# Patient Record
Sex: Female | Born: 1985 | Hispanic: No | Marital: Married | Smoking: Former smoker
Health system: Southern US, Community
[De-identification: ages and names within clinical notes are randomized; demographics above are authoritative.]

## PROBLEM LIST (undated history)

## (undated) ENCOUNTER — Emergency Department (HOSPITAL_COMMUNITY): Admission: EM | Payer: BC Managed Care – PPO | Source: Home / Self Care

## (undated) DIAGNOSIS — R011 Cardiac murmur, unspecified: Secondary | ICD-10-CM

## (undated) DIAGNOSIS — F419 Anxiety disorder, unspecified: Secondary | ICD-10-CM

## (undated) DIAGNOSIS — G4700 Insomnia, unspecified: Secondary | ICD-10-CM

## (undated) DIAGNOSIS — D649 Anemia, unspecified: Secondary | ICD-10-CM

## (undated) DIAGNOSIS — F329 Major depressive disorder, single episode, unspecified: Secondary | ICD-10-CM

## (undated) DIAGNOSIS — T7840XA Allergy, unspecified, initial encounter: Secondary | ICD-10-CM

## (undated) DIAGNOSIS — K219 Gastro-esophageal reflux disease without esophagitis: Secondary | ICD-10-CM

## (undated) DIAGNOSIS — F988 Other specified behavioral and emotional disorders with onset usually occurring in childhood and adolescence: Secondary | ICD-10-CM

## (undated) DIAGNOSIS — F32A Depression, unspecified: Secondary | ICD-10-CM

## (undated) DIAGNOSIS — Z8709 Personal history of other diseases of the respiratory system: Secondary | ICD-10-CM

## (undated) DIAGNOSIS — F410 Panic disorder [episodic paroxysmal anxiety] without agoraphobia: Secondary | ICD-10-CM

## (undated) DIAGNOSIS — R51 Headache: Secondary | ICD-10-CM

## (undated) DIAGNOSIS — Z8669 Personal history of other diseases of the nervous system and sense organs: Secondary | ICD-10-CM

## (undated) DIAGNOSIS — N390 Urinary tract infection, site not specified: Secondary | ICD-10-CM

## (undated) HISTORY — PX: WISDOM TOOTH EXTRACTION: SHX21

## (undated) HISTORY — DX: Anemia, unspecified: D64.9

## (undated) HISTORY — DX: Depression, unspecified: F32.A

## (undated) HISTORY — DX: Insomnia, unspecified: G47.00

## (undated) HISTORY — DX: Allergy, unspecified, initial encounter: T78.40XA

## (undated) HISTORY — DX: Cardiac murmur, unspecified: R01.1

## (undated) HISTORY — DX: Other specified behavioral and emotional disorders with onset usually occurring in childhood and adolescence: F98.8

## (undated) HISTORY — DX: Major depressive disorder, single episode, unspecified: F32.9

## (undated) HISTORY — DX: Gastro-esophageal reflux disease without esophagitis: K21.9

---
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
## 2011-04-19 ENCOUNTER — Ambulatory Visit (INDEPENDENT_AMBULATORY_CARE_PROVIDER_SITE_OTHER): Payer: BC Managed Care – PPO | Admitting: Family Medicine

## 2011-04-19 ENCOUNTER — Encounter: Payer: Self-pay | Admitting: Family Medicine

## 2011-04-19 VITALS — BP 109/71 | HR 88 | Temp 98.8°F | Resp 16 | Ht 67.0 in | Wt 118.0 lb

## 2011-04-19 DIAGNOSIS — R350 Frequency of micturition: Secondary | ICD-10-CM

## 2011-04-19 LAB — POCT UA - MICROSCOPIC ONLY
Casts, Ur, LPF, POC: NEGATIVE
Crystals, Ur, HPF, POC: NEGATIVE
RBC, urine, microscopic: NEGATIVE

## 2011-04-19 LAB — POCT URINALYSIS DIPSTICK
Blood, UA: NEGATIVE
Protein, UA: NEGATIVE
Spec Grav, UA: 1.015
Urobilinogen, UA: 0.2
pH, UA: 5

## 2011-04-19 MED ORDER — NITROFURANTOIN MONOHYD MACRO 100 MG PO CAPS: 100.0000 mg | ORAL_CAPSULE | Freq: Two times a day (BID) | ORAL | Status: AC

## 2011-04-19 NOTE — Progress Notes (Signed)
  Subjective:    Patient ID: Krista Lee, female    DOB: 12/08/1985, 26 y.o.   MRN: [id number]  HPI 26 yo female with urinary frequency and abdominal cramping. Started period about 2.5 weeks ago.  Cramping never really went away.  For 4 days, urinary frequency, dysuria, urgency.  NOt worried about STD.  Monogamous and recently tested.  No smell or odor.  Has had one before. Azo helped Monday, tues.  Stopped and cramping came back.     Review of Systems    Negative except as per HPI  Objective:   Physical Exam  Constitutional: Vital signs are normal. She appears well-developed and well-nourished. She is active.  Cardiovascular: Normal rate, regular rhythm, normal heart sounds and normal pulses.   Pulmonary/Chest: Effort normal and breath sounds normal.  Abdominal: Soft. Normal appearance and bowel sounds are normal. She exhibits no distension and no mass. There is no hepatosplenomegaly. There is tenderness. There is no rigidity, no rebound, no guarding, no CVA tenderness, no tenderness at McBurney's point and negative Murphy's sign. No hernia.       Suprapubic tenderness  Neurological: She is alert.   Results for orders placed in visit on 04/19/11  POCT UA - MICROSCOPIC ONLY      Component Value Range   WBC, Ur, HPF, POC 1-4     RBC, urine, microscopic neg     Bacteria, U Microscopic trace     Mucus, UA neg     Epithelial cells, urine per micros 0-2     Crystals, Ur, HPF, POC neg     Casts, Ur, LPF, POC neg     Yeast, UA neg    POCT URINALYSIS DIPSTICK      Component Value Range   Color, UA yellow     Clarity, UA clear     Glucose, UA neg     Bilirubin, UA neg     Ketones, UA trace     Spec Grav, UA 1.015     Blood, UA neg     pH, UA 5.0     Protein, UA neg     Urobilinogen, UA 0.2     Nitrite, UA neg     Leukocytes, UA Negative            Assessment & Plan:  U/A not overwhelmingly convincing but has been taking pyridium, even today.  Symptoms consistent.  Has appt  soon with gyn.  Will culture and start macrobid.

## 2011-04-24 ENCOUNTER — Ambulatory Visit (INDEPENDENT_AMBULATORY_CARE_PROVIDER_SITE_OTHER): Payer: BC Managed Care – PPO | Admitting: Physician Assistant

## 2011-04-24 VITALS — BP 111/74 | HR 80 | Temp 98.4°F | Resp 18 | Ht 66.5 in | Wt 117.8 lb

## 2011-04-24 DIAGNOSIS — N949 Unspecified condition associated with female genital organs and menstrual cycle: Secondary | ICD-10-CM

## 2011-04-24 DIAGNOSIS — N760 Acute vaginitis: Secondary | ICD-10-CM

## 2011-04-24 DIAGNOSIS — R102 Pelvic and perineal pain: Secondary | ICD-10-CM

## 2011-04-24 LAB — POCT WET PREP WITH KOH
Epithelial Wet Prep HPF POC: NEGATIVE
Yeast Wet Prep HPF POC: NEGATIVE

## 2011-04-24 LAB — POCT URINALYSIS DIPSTICK
Blood, UA: NEGATIVE
Ketones, UA: 15
Protein, UA: NEGATIVE
Spec Grav, UA: 1.025
Urobilinogen, UA: 0.2

## 2011-04-24 LAB — POCT UA - MICROSCOPIC ONLY
Casts, Ur, LPF, POC: NEGATIVE
Crystals, Ur, HPF, POC: NEGATIVE
Mucus, UA: POSITIVE

## 2011-04-24 MED ORDER — METRONIDAZOLE 500 MG PO TABS: ORAL_TABLET | ORAL | Status: DC

## 2011-04-24 NOTE — Progress Notes (Signed)
  Subjective:    Patient ID: Krista Lee, female    DOB: [birth year], 26 y.o.   MRN: [id number]  HPI See last OV.  Pt placed Nuva ring and has continued to have uterine cramping for the last 10 days.  Monogamous with bf of 6 years.  Says her MD told her she can use Nuva ring for 4 weeks then go 1 week without it. No f/c.  Cramping is just R to midline of uterus.  Patient does admit to not always being consistent with ring use.  [facility] last year and WNL. Pain/cramping not bad enough to take any OTC meds for.   Review of Systems  All other systems reviewed and are negative.       Objective:   Physical Exam  Nursing note and vitals reviewed. Constitutional: She is oriented to person, place, and time. She appears well-developed and well-nourished.  HENT:  Head: Normocephalic and atraumatic.  Cardiovascular: Normal rate, regular rhythm and normal heart sounds.   Pulmonary/Chest: Effort normal and breath sounds normal.  Genitourinary: Vagina normal. No vaginal discharge found.       Nuva ring in place. Nulliparous cervix.  Wet prep and genprobe taken.  Slightly tender and minimal fullness R adnexa, but bimanual overall unremarkable and negative chandelier sign.  Neurological: She is alert and oriented to person, place, and time.  Skin: Skin is warm and dry.   Results for orders placed in visit on 04/24/11  POCT WET PREP WITH KOH      Component Value Range   Trichomonas, UA Negative     Clue Cells Wet Prep HPF POC TNTC     Epithelial Wet Prep HPF POC negative     Yeast Wet Prep HPF POC negative     Bacteria Wet Prep HPF POC 4+     RBC Wet Prep HPF POC 2-5     WBC Wet Prep HPF POC 4-5     KOH Prep POC Negative    POCT URINE PREGNANCY      Component Value Range   Preg Test, Ur Negative    POCT UA - MICROSCOPIC ONLY      Component Value Range   WBC, Ur, HPF, POC 1-4     RBC, urine, microscopic 0-2     Bacteria, U Microscopic trace     Mucus, UA  pos     Epithelial cells, urine per micros 0-1     Crystals, Ur, HPF, POC neg     Casts, Ur, LPF, POC neg     Yeast, UA neg    POCT URINALYSIS DIPSTICK      Component Value Range   Color, UA yellow     Clarity, UA clear     Glucose, UA negative     Bilirubin, UA negative     Ketones, UA 15     Spec Grav, UA 1.025     Blood, UA negative     pH, UA 5.0     Protein, UA negative     Urobilinogen, UA 0.2     Nitrite, UA negative     Leukocytes, UA Negative         Assessment & Plan:  Bacterial vaginosis-metronidazole Pelvic cramping-likely small breakthrough ovarian cyst that should resolve now that back on consistent use of nuva ring.  Genprobe sent.  If cramping worsens/develops pain/fever-to ER.

## 2011-04-26 LAB — GC/CHLAMYDIA PROBE AMP, GENITAL: GC Probe Amp, Genital: NEGATIVE

## 2011-07-18 ENCOUNTER — Ambulatory Visit (INDEPENDENT_AMBULATORY_CARE_PROVIDER_SITE_OTHER): Payer: BC Managed Care – PPO | Admitting: Emergency Medicine

## 2011-07-18 ENCOUNTER — Ambulatory Visit: Payer: BC Managed Care – PPO

## 2011-07-18 VITALS — BP 104/62 | HR 98 | Temp 98.9°F | Resp 16 | Ht 66.0 in | Wt 122.0 lb

## 2011-07-18 DIAGNOSIS — R109 Unspecified abdominal pain: Secondary | ICD-10-CM

## 2011-07-18 LAB — POCT URINALYSIS DIPSTICK
Bilirubin, UA: NEGATIVE
Glucose, UA: NEGATIVE
Ketones, UA: NEGATIVE
Leukocytes, UA: NEGATIVE
Nitrite, UA: NEGATIVE
pH, UA: 5.5

## 2011-07-18 LAB — POCT CBC
Granulocyte percent: 58.1 %G (ref 37–80)
HCT, POC: 48 % — AB (ref 37.7–47.9)
MCH, POC: 29.5 pg (ref 27–31.2)
MCV: 96.4 fL (ref 80–97)
MID (cbc): 0.5 (ref 0–0.9)
POC LYMPH PERCENT: 36 %L (ref 10–50)
Platelet Count, POC: 337 10*3/uL (ref 142–424)
RDW, POC: 13.7 %
WBC: 8 10*3/uL (ref 4.6–10.2)

## 2011-07-18 LAB — POCT UA - MICROSCOPIC ONLY: Yeast, UA: NEGATIVE

## 2011-07-18 NOTE — Patient Instructions (Signed)
Follow the clean-out on the handout I gave you.  If you are still having pain in 48 hours, please call.  I will plan to order a CT scan of the abdomen and pelvis to further evaluate your discomfort.

## 2011-07-18 NOTE — Progress Notes (Signed)
Subjective:    Patient ID: Krista Lee, female    DOB: 1985/12/15, 26 y.o.   MRN: [id number]  HPI This 26 y.o. Female presents for evaluation of left flank pain. Symptoms have been present for several weeks.  No trauma or injury. No fever or chills. She denies urinary urgency, frequency and burning.  No hematuria.  No vaginal symptoms.  She uses NuvaRing, and cycles continuously to cause amenorrhea.  Mild nausea yesterday after taking some allergy meds yesterday, different from previous GERD pain she has had.  No change in stools-has a BM daily without discomfort.   The pain is primarily in the left upper quadrant/"side" but sometimes "settles" in the left mid back and sometimes briefly moves to the right upper quadrant.  She cannot reproduce the pain with intentional movements or palpation, but it sometimes occurs with bending.  She relates chronic severe constipation during childhood that spontaneously resolved at age 71 years.  She was evaluated at another facility about 10 days ago.  She reports that her urine specimen was "normal," and that "there was no infection in any of my organs."  Pelvic US was negative.  Review of Systems As above.   Past Medical History  Diagnosis Date  . ADD (attention deficit disorder)   . Allergy   . GERD (gastroesophageal reflux disease)    Prior to Admission medications   Medication Sig Start Date End Date Taking? Authorizing Provider  etonogestrel-ethinyl estradiol (NUVARING) 0.12-0.015 MG/24HR vaginal ring Place 1 each vaginally every 28 (twenty-eight) days. Insert vaginally and leave in place for 3 consecutive weeks, then remove for 1 week.   Yes Historical Provider, MD    Allergies  Allergen Reactions  . Ibuprofen Other (See Comments)    Childhood reaction of high fever    History   Social History  . Marital Status: Singlge    Number of Children: 0   Occupational History  . Hair Stylist    Social History Main Topics  . Smoking  status: Current Everyday Smoker -- 1.0 packs/day for 8 years    Types: Cigarettes  . Smokeless tobacco: Former Neurosurgeon   Comment: has used Radiographer, therapeutic in the past; counseled on smoking cessation  . Alcohol Use: 7.0 oz/week    14 drink(s) per week     social   Family History  Problem Relation Age of Onset  . Hypertension Mother   . Diverticulitis Mother   . Hyperlipidemia Mother        Objective:   Physical Exam Vital signs noted. Well-developed, well nourished WF who is awake, alert and oriented, in NAD. HEENT: [place]/AT, sclera and conjunctiva are clear.   Neck: supple, non-tender, no lymphadenopathy, thyromegaly. Heart: RRR, no murmur Lungs: CTA Abdomen: normo-active bowel sounds, supple, no mass or organomegaly. Tenderness in the left quadrants.  No guarding or rebound tenderness. Extremities: no cyanosis, clubbing or edema. Skin: warm and dry without rash.  Acute abdominal Series: [facility] reading (PRIMARY) by  Dr. Cleta Alberts.  Large stool volume.  Round density noted in the left upper abdomen on flat film, not seen on other views. Please comment-?artifact vs. Gallstone?  Results for orders placed in visit on 07/18/11  POCT UA - MICROSCOPIC ONLY      Component Value Range   WBC, Ur, HPF, POC 1-2     RBC, urine, microscopic 0-1     Bacteria, U Microscopic 1+     Mucus, UA positive     Epithelial cells, urine per micros 3-7  Crystals, Ur, HPF, POC negaitve     Casts, Ur, LPF, POC negative     Yeast, UA negative    POCT URINALYSIS DIPSTICK      Component Value Range   Color, UA yellow     Clarity, UA cloudy     Glucose, UA negative     Bilirubin, UA negative     Ketones, UA negative     Spec Grav, UA >=1.030     Blood, UA negative     pH, UA 5.5     Protein, UA negative     Urobilinogen, UA 0.2     Nitrite, UA negative     Leukocytes, UA Negative    POCT CBC      Component Value Range   WBC 8.0  4.6 - 10.2 K/uL   Lymph, poc 2.9  0.6 - 3.4   POC LYMPH PERCENT 36.0  10 - 50 %L     MID (cbc) 0.5  0 - 0.9   POC MID % 5.9  0 - 12 %M   POC Granulocyte 4.6  2 - 6.9   Granulocyte percent 58.1  37 - 80 %G   RBC 4.98  4.04 - 5.48 M/uL   Hemoglobin 14.7  12.2 - 16.2 g/dL   HCT, POC 16.1 (*) 09.6 - 47.9 %   MCV 96.4  80 - 97 fL   MCH, POC 29.5  27 - 31.2 pg   MCHC 30.6 (*) 31.8 - 35.4 g/dL   RDW, POC 04.5     Platelet Count, POC 337  142 - 424 K/uL   MPV 8.5  0 - 99.8 fL      Assessment & Plan:   1. Flank pain  POCT UA - Microscopic Only, POCT urinalysis dipstick, POCT CBC, DG Abd Acute W/Chest, Urine culture   Symptoms are probably due to large stool load.  Miralax clean out with 14 doses of Miralax in 64 ounces of Gatorade.  If her pain persists, she will call and I will order a CT of the abdomen and pelvis.  Discussed with Dr. Cleta Alberts.

## 2011-07-19 ENCOUNTER — Encounter: Payer: Self-pay | Admitting: Emergency Medicine

## 2011-07-19 ENCOUNTER — Telehealth: Payer: Self-pay

## 2011-07-19 DIAGNOSIS — R109 Unspecified abdominal pain: Secondary | ICD-10-CM

## 2011-07-19 NOTE — Telephone Encounter (Signed)
Ok to order?  (Note advises so)

## 2011-07-19 NOTE — Telephone Encounter (Signed)
Please call to verify no pregnancy so we can order CT

## 2011-07-19 NOTE — Telephone Encounter (Signed)
Pt complaining about additional abdominal pain.  Was told by Chelle if pain persist to call back and see if a CT study could be ordered.

## 2011-07-20 LAB — URINE CULTURE

## 2011-07-20 NOTE — Telephone Encounter (Signed)
Pt calling in to check on the date of ct scan, she would like to let someone know she can only Monday or sundays and please don't schedule a appt on Monday the 29th she is not availble..[phone]

## 2011-07-21 NOTE — Telephone Encounter (Signed)
Patient aware of order and that we will call her hopefully Monday am UJ:[id number].

## 2011-07-21 NOTE — Telephone Encounter (Signed)
Spoke with patient, she states she is still having abdominal pain.  Has had multiple BM after using Miralax, yet pain continues.  Would like to have CT Abdomen/pelvis done-- preferably on Monday.  There is no concern for pregnancy-- uses Nuvaring faithfully and it causes her not to have a cycle regularly.  Can we go ahead and schedule CT?

## 2011-07-21 NOTE — Telephone Encounter (Signed)
LMOM to CB. 

## 2011-07-21 NOTE — Telephone Encounter (Signed)
Order signed.

## 2011-07-23 ENCOUNTER — Telehealth: Payer: Self-pay

## 2011-07-23 ENCOUNTER — Ambulatory Visit
Admission: RE | Admit: 2011-07-23 | Discharge: 2011-07-23 | Disposition: A | Discharge status: Home / Self Care | Payer: BC Managed Care – PPO | Source: Ambulatory Visit | Attending: Physician Assistant | Admitting: Physician Assistant

## 2011-07-23 DIAGNOSIS — R109 Unspecified abdominal pain: Secondary | ICD-10-CM

## 2011-07-23 MED ORDER — IOHEXOL 300 MG/ML  SOLN: 100.0000 mL | Freq: Once | INTRAMUSCULAR | Status: AC | PRN

## 2011-07-23 NOTE — Telephone Encounter (Signed)
Pt would like to know her CT scan results. Best# [phone]

## 2011-07-24 NOTE — Telephone Encounter (Signed)
Chelle called pt last night

## 2011-07-26 ENCOUNTER — Telehealth: Payer: Self-pay

## 2011-07-26 DIAGNOSIS — R109 Unspecified abdominal pain: Secondary | ICD-10-CM

## 2011-07-26 NOTE — Telephone Encounter (Signed)
Pt is wanting to talk with someone about a gi referral the best date for her is august 5th  Best number [phone]

## 2011-07-30 ENCOUNTER — Ambulatory Visit (INDEPENDENT_AMBULATORY_CARE_PROVIDER_SITE_OTHER): Payer: BC Managed Care – PPO | Admitting: Family Medicine

## 2011-07-30 VITALS — BP 112/60 | HR 93 | Temp 99.2°F | Resp 16 | Ht 65.5 in | Wt 123.0 lb

## 2011-07-30 DIAGNOSIS — IMO0001 Reserved for inherently not codable concepts without codable children: Secondary | ICD-10-CM

## 2011-07-30 DIAGNOSIS — R109 Unspecified abdominal pain: Secondary | ICD-10-CM

## 2011-07-30 DIAGNOSIS — Z309 Encounter for contraceptive management, unspecified: Secondary | ICD-10-CM

## 2011-07-30 LAB — POCT WET PREP WITH KOH
Clue Cells Wet Prep HPF POC: NEGATIVE
Yeast Wet Prep HPF POC: NEGATIVE

## 2011-07-30 MED ORDER — ETONOGESTREL-ETHINYL ESTRADIOL 0.12-0.015 MG/24HR VA RING: VAGINAL_RING | VAGINAL | Status: DC

## 2011-07-30 NOTE — Progress Notes (Addendum)
[facility] [address], [place] [address] [phone]  Date:  07/30/2011   Name:  Krista Lee   DOB:  25-Jul-1985   MRN:  [id number]  PCP:  No primary provider on file.    Chief Complaint: Abdominal Cramping   History of Present Illness:  Krista Lee is a 26 y.o. very pleasant female patient who presents with the following:  She has had problems with abdominal pain for some time. Lately she has noted some lower abdominal cramping as well- she would like to have a "pap smear" to be sure she does not have BV. (She is also due for a refill of her nuva- ring.)  Explained the difference between a pap and a wet prep.  As her last pap was last September, we will defer the actual pap to avoid denial by her insurance company  She has never had an abnormal pap in the past.    She does not note any unusual discharge, and is sexually active with one mutually monogamous female partner.  She does not note any urinary symptoms at this time.   She was seen at an urgent care in [place] earlier this month.  She reports that she had a negative ultrasound, urine test and negative pregnancy test.  Per her report she has taken many pregnancy tests at home during the course of this problem- they have all been negative and she declined the repeat the test today.    She uses the NR continuously and does not have menses.   No bleeding lately.   She is not having any vomiting, no temperature higher than 99.4.  She had a negative CT abdomen and pelvis on 07/21/11 with the exception of a gallstone (tiny)  There is no problem list on file for this patient.   Past Medical History  Diagnosis Date  . ADD (attention deficit disorder)   . Allergy   . GERD (gastroesophageal reflux disease)     Past Surgical History  Procedure Date  . Wisdom tooth extraction     History  Substance Use Topics  . Smoking status: Current Everyday Smoker -- 1.0 packs/day for 8 years    Types:  Cigarettes  . Smokeless tobacco: Former Neurosurgeon   Comment: has used Radiographer, therapeutic in the past; counseled on smoking cessation  . Alcohol Use: 7.0 oz/week    14 drink(s) per week     social    Family History  Problem Relation Age of Onset  . Hypertension Mother   . Diverticulitis Mother   . Hyperlipidemia Mother     Allergies  Allergen Reactions  . Ibuprofen Other (See Comments)    Childhood reaction of high fever    Medication list has been reviewed and updated.  Current Outpatient Prescriptions on File Prior to Visit  Medication Sig Dispense Refill  . etonogestrel-ethinyl estradiol (NUVARING) 0.12-0.015 MG/24HR vaginal ring Place 1 each vaginally every 28 (twenty-eight) days. Insert vaginally and leave in place for 3 consecutive weeks, then remove for 1 week.        Review of Systems:  As per HPI- otherwise negative.   Physical Examination: Filed Vitals:   07/30/11 1440  BP: 112/60  Pulse: 93  Temp: 99.2 F (37.3 C)  Resp: 16   Filed Vitals:   07/30/11 1440  Height: 5' 5.5" (1.664 m)  Weight: 123 lb (55.792 kg)   Body mass index is 20.16 kg/(m^2). Ideal Body Weight: Weight in (lb) to have BMI =  25: 152.2   GEN: WDWN, NAD, Non-toxic, A & O x 3 HEENT: Atraumatic, Normocephalic. Neck supple. No masses, No LAD.   Ears and Nose: No external deformity. CV: RRR, No M/G/R. No JVD. No thrill. No extra heart sounds. PULM: CTA B, no wheezes, crackles, rhonchi. No retractions. No resp. distress. No accessory muscle use. ABD: S, NT, ND, +BS. No rebound. No HSM. EXTR: No c/c/e NEURO Normal gait.  PSYCH: Normally interactive. Conversant. Not depressed or anxious appearing.  Calm demeanor.  GU: normal pelvic exam, normal cervix and adnexa.  No CMT  Results for orders placed in visit on 07/30/11  POCT WET PREP WITH KOH      Component Value Range   Trichomonas, UA Negative     Clue Cells Wet Prep HPF POC negative     Epithelial Wet Prep HPF POC 0-3     Yeast Wet Prep HPF POC  negative     Bacteria Wet Prep HPF POC 1+     RBC Wet Prep HPF POC 0-1     WBC Wet Prep HPF POC 0-1     KOH Prep POC Negative      Assessment and Plan: 1. Abdominal pain  POCT Wet Prep with KOH, GC/chlamydia probe amp, genital  2. Contraception  etonogestrel-ethinyl estradiol (NUVARING) 0.12-0.015 MG/24HR vaginal ring   No sign of infection to explain her persistent abdominal pain and cramping.  Did not repeat pregnancy test today, but she had a normal CT scan 9 days ago so pregnancy is very unlikely to be the cause of her pain.  Await genprobe.  She will see GI for further evaluation- however she may need to reschedule the appt that she has due to travel plans.    COPLAND,JESSICA, MD  Called pt- I would like her to have a pregnancy test to be sure that an ectopic pregnancy is not an issue. She has a test at home an will take it tonight- she will call right away if it comes back positive.  Otherwise I will call her in the next day or so with her genprobe.

## 2011-07-30 NOTE — Telephone Encounter (Signed)
Please advise the patient I have requested the referral to GI for 8/5.  Referrals-FYI.

## 2011-07-30 NOTE — Telephone Encounter (Signed)
I called pt to advise the request for GI has been sent and we have advised them of this day.

## 2011-07-31 LAB — GC/CHLAMYDIA PROBE AMP, GENITAL: GC Probe Amp, Genital: NEGATIVE

## 2011-08-02 ENCOUNTER — Encounter: Payer: Self-pay | Admitting: Family Medicine

## 2011-08-03 ENCOUNTER — Telehealth: Payer: Self-pay | Admitting: Radiology

## 2011-08-03 NOTE — Telephone Encounter (Signed)
I spoke to patient she was returning Dr Cyndie Chime call, patient was advised her STD screening was neg.  She is still having pain with her abdomen and has gotten call from GI, she will let us know if pain is more severe. She is going to contact the GI specialist for appt when she gets back from out of town.

## 2011-08-16 ENCOUNTER — Ambulatory Visit (INDEPENDENT_AMBULATORY_CARE_PROVIDER_SITE_OTHER): Payer: BC Managed Care – PPO | Admitting: Physician Assistant

## 2011-08-16 VITALS — BP 103/66 | HR 82 | Temp 98.8°F | Resp 16 | Ht 65.75 in | Wt 124.0 lb

## 2011-08-16 DIAGNOSIS — R109 Unspecified abdominal pain: Secondary | ICD-10-CM

## 2011-08-16 DIAGNOSIS — R112 Nausea with vomiting, unspecified: Secondary | ICD-10-CM

## 2011-08-16 DIAGNOSIS — K3189 Other diseases of stomach and duodenum: Secondary | ICD-10-CM

## 2011-08-16 DIAGNOSIS — R1013 Epigastric pain: Secondary | ICD-10-CM

## 2011-08-16 LAB — POCT URINALYSIS DIPSTICK
Glucose, UA: NEGATIVE
Spec Grav, UA: 1.02
Urobilinogen, UA: 0.2

## 2011-08-16 LAB — POCT CBC
HCT, POC: 45.6 % (ref 37.7–47.9)
Lymph, poc: 2.7 (ref 0.6–3.4)
MCH, POC: 29.7 pg (ref 27–31.2)
MCHC: 31.1 g/dL — AB (ref 31.8–35.4)
MCV: 95.5 fL (ref 80–97)
POC LYMPH PERCENT: 34 %L (ref 10–50)
RDW, POC: 13.7 %

## 2011-08-16 LAB — POCT UA - MICROSCOPIC ONLY

## 2011-08-16 MED ORDER — ONDANSETRON 8 MG PO TBDP: 8.0000 mg | ORAL_TABLET | Freq: Three times a day (TID) | ORAL | Status: AC | PRN

## 2011-08-16 MED ORDER — DICYCLOMINE HCL 20 MG PO TABS: 20.0000 mg | ORAL_TABLET | Freq: Four times a day (QID) | ORAL | Status: DC

## 2011-08-16 NOTE — Progress Notes (Signed)
Subjective:    Patient ID: Krista Lee, female    DOB: [birth year], 26 y.o.   MRN: [id number]  HPI 26 yo CF presents with abdominal pain.  See last several OVs.  She has had CT scans and U/S which have revealed only one 5mm gallstone.  She says she has been having the pain for 6 weeks.  She was in [facility] this past week and drank too much alcohol on Monday night.  This seems to have exacerbated her abdominal pain.  She had nausea and vomiting on Tuesday and continues to feel nauseated and queasy.  The pain in her abdomen has not changed.  It is in her L and R mid to upper abdomen. Sometimes it occurs in her pelvic region.  She admits to some indigestion and a lot of burping.  Sometimes the pain is just achey and constant. Other times it is stabbing and sharp pains in various parts of her L and R abdomen.  She says no forms of treatment have helped thus far.  She "failed prilosec" but actually only took it 4 days in a row.  The hydrocodone she was given takes the pain "from an 8 to a 6."  The miralax did not give her any relief.  Says her bowels are moving regularly.  She denies any urinary s/sx. She didn't schedule her GI appt bc she was going out of town.  She will call tomorrow.  I reviewed her previous labs and office visits as well as the scans.   Review of Systems  All other systems reviewed and are negative.       Objective:   Physical Exam  Nursing note and vitals reviewed. Constitutional: She is oriented to person, place, and time. She appears well-developed and well-nourished.  HENT:  Head: Normocephalic and atraumatic.  Mouth/Throat: Oropharynx is clear and moist. No oropharyngeal exudate.  Neck: Normal range of motion. Neck supple.  Cardiovascular: Normal rate, regular rhythm and normal heart sounds.   Pulmonary/Chest: Effort normal and breath sounds normal.  Abdominal: Soft. Bowel sounds are normal. There is tenderness (general TTP throughout. not as much TTP with distraction.   definitely non-acute abdomen).       Increased bowel sounds  Neurological: She is alert and oriented to person, place, and time. No cranial nerve deficit.  Skin: Skin is warm and dry.     Results for orders placed in visit on 08/16/11  POCT CBC      Component Value Range   WBC 7.9  4.6 - 10.2 K/uL   Lymph, poc 2.7  0.6 - 3.4   POC LYMPH PERCENT 34.0  10 - 50 %L   MID (cbc) 0.5  0 - 0.9   POC MID % 6.4  0 - 12 %M   POC Granulocyte 4.7  2 - 6.9   Granulocyte percent 59.6  37 - 80 %G   RBC 4.78  4.04 - 5.48 M/uL   Hemoglobin 14.2  12.2 - 16.2 g/dL   HCT, POC 30.8  65.7 - 47.9 %   MCV 95.5  80 - 97 fL   MCH, POC 29.7  27 - 31.2 pg   MCHC 31.1 (*) 31.8 - 35.4 g/dL   RDW, POC 84.6     Platelet Count, POC 341  142 - 424 K/uL   MPV 8.6  0 - 99.8 fL  POCT URINALYSIS DIPSTICK      Component Value Range   Color, UA yellow  Clarity, UA clear     Glucose, UA neg     Bilirubin, UA neg     Ketones, UA neg     Spec Grav, UA 1.020     Blood, UA neg     pH, UA 7.0     Protein, UA neg     Urobilinogen, UA 0.2     Nitrite, UA neg     Leukocytes, UA Negative    POCT UA - MICROSCOPIC ONLY      Component Value Range   WBC, Ur, HPF, POC 1-2     RBC, urine, microscopic 0-1     Bacteria, U Microscopic sm     Mucus, UA neg     Epithelial cells, urine per micros 3-6     Crystals, Ur, HPF, POC neg     Casts, Ur, LPF, POC neg     Yeast, UA neg          Assessment & Plan:  Continued abdominal pain-checking h. Pylori and ESR as those are the only ones that do not seem to have been ordered. Patient will call for her GI appointment tomorrow.  Add bentyl. Abdominal pain warnings discussed and she will go to ER if those develop. Continued nausea-add zofran prn.  Advised prilosec daily for a minimum of 2 weeks this time before deciding whether or not it is working. Thus far, symptoms out of proportion to findings over the last several visits. Proceed with GI appt.  Await labs. No  alcohol! Spent >40 minutes face to face with patient.

## 2011-08-17 LAB — COMPREHENSIVE METABOLIC PANEL
Albumin: 4.3 g/dL (ref 3.5–5.2)
Alkaline Phosphatase: 37 U/L — ABNORMAL LOW (ref 39–117)
BUN: 13 mg/dL (ref 6–23)
Creat: 0.69 mg/dL (ref 0.50–1.10)
Glucose, Bld: 88 mg/dL (ref 70–99)
Potassium: 4 mEq/L (ref 3.5–5.3)
Total Bilirubin: 0.4 mg/dL (ref 0.3–1.2)

## 2011-08-18 ENCOUNTER — Telehealth: Payer: Self-pay

## 2011-08-18 LAB — SEDIMENTATION RATE: Sed Rate: 1 mm/hr (ref 0–22)

## 2011-08-18 NOTE — Telephone Encounter (Signed)
CALL [facility] AND CHECK H. PYLORI

## 2011-08-18 NOTE — Telephone Encounter (Signed)
Krista Lee has called twice wanting lab results.  [phone].

## 2011-08-20 LAB — H. PYLORI ANTIBODY, IGG: H Pylori IgG: <0.4 {ISR}

## 2011-08-20 NOTE — Telephone Encounter (Signed)
Results at moment are preliminary, should be today-tomorrow.Marland Kitchen

## 2011-08-21 ENCOUNTER — Telehealth: Payer: Self-pay

## 2011-08-21 NOTE — Telephone Encounter (Signed)
Pt wants to know her labs  Call [phone]

## 2011-08-21 NOTE — Telephone Encounter (Signed)
H. Pylori still pending.

## 2011-08-21 NOTE — Telephone Encounter (Signed)
It's back, see labs

## 2011-08-24 ENCOUNTER — Encounter: Payer: Self-pay | Admitting: Physician Assistant

## 2011-08-24 DIAGNOSIS — R109 Unspecified abdominal pain: Secondary | ICD-10-CM | POA: Insufficient documentation

## 2011-08-25 ENCOUNTER — Telehealth: Payer: Self-pay

## 2011-08-27 ENCOUNTER — Other Ambulatory Visit: Payer: Self-pay | Admitting: Gastroenterology

## 2011-08-27 DIAGNOSIS — R1011 Right upper quadrant pain: Secondary | ICD-10-CM

## 2011-08-27 DIAGNOSIS — R1012 Left upper quadrant pain: Secondary | ICD-10-CM

## 2011-09-10 ENCOUNTER — Encounter (HOSPITAL_COMMUNITY)
Admission: RE | Admit: 2011-09-10 | Discharge: 2011-09-10 | Disposition: A | Discharge status: Home / Self Care | Payer: BC Managed Care – PPO | Source: Ambulatory Visit | Attending: Gastroenterology | Admitting: Gastroenterology

## 2011-09-10 DIAGNOSIS — R1012 Left upper quadrant pain: Secondary | ICD-10-CM

## 2011-09-10 DIAGNOSIS — R1011 Right upper quadrant pain: Secondary | ICD-10-CM

## 2011-09-10 MED ORDER — TECHNETIUM TC 99M MEBROFENIN IV KIT
1.4000 | PACK | Freq: Once | INTRAVENOUS | Status: AC | PRN
  Administered 2011-09-10: 1 via INTRAVENOUS

## 2011-09-10 MED ORDER — TECHNETIUM TC 99M MEBROFENIN IV KIT
5.0000 | PACK | Freq: Once | INTRAVENOUS | Status: AC | PRN
  Administered 2011-09-10: 5 via INTRAVENOUS

## 2011-09-10 MED ORDER — SINCALIDE 5 MCG IJ SOLR
0.0200 ug/kg | Freq: Once | INTRAMUSCULAR | Status: AC
  Administered 2011-09-10: 1.1 ug via INTRAVENOUS

## 2011-09-12 ENCOUNTER — Encounter (INDEPENDENT_AMBULATORY_CARE_PROVIDER_SITE_OTHER): Payer: Self-pay | Admitting: Surgery

## 2011-09-25 ENCOUNTER — Encounter (INDEPENDENT_AMBULATORY_CARE_PROVIDER_SITE_OTHER): Payer: Self-pay | Admitting: Surgery

## 2011-09-25 ENCOUNTER — Ambulatory Visit (INDEPENDENT_AMBULATORY_CARE_PROVIDER_SITE_OTHER): Payer: BC Managed Care – PPO | Admitting: Surgery

## 2011-09-25 VITALS — BP 118/70 | HR 86 | Temp 97.5°F | Resp 18 | Ht 67.0 in | Wt 126.0 lb

## 2011-09-25 DIAGNOSIS — K828 Other specified diseases of gallbladder: Secondary | ICD-10-CM | POA: Insufficient documentation

## 2011-09-25 DIAGNOSIS — K8020 Calculus of gallbladder without cholecystitis without obstruction: Secondary | ICD-10-CM | POA: Insufficient documentation

## 2011-09-25 HISTORY — DX: Calculus of gallbladder without cholecystitis without obstruction: K80.20

## 2011-09-25 NOTE — Progress Notes (Signed)
Patient ID: Krista Lee, female   DOB: 1985-11-04, 26 y.o.   MRN: [id number]  Chief Complaint  Patient presents with  . New Evaluation    G.B    HPI Krista Lee is a 26 y.o. female.   HPI This is a very pleasant female referred by Dr. Elnoria Howard for evaluation of biliary problems. She actually started having left upper quadrant abdominal pain first. It has now moved to the epigastric and right upper quadrant it is worse with fatty meals. She has pain almost daily. She has only mild nausea. Bowel movements are normal. She has no emesis. Her pain is moderate to severe. Past Medical History  Diagnosis Date  . ADD (attention deficit disorder)   . Allergy   . GERD (gastroesophageal reflux disease)   . Insomnia   . Anemia     as adolescent    Past Surgical History  Procedure Date  . Wisdom tooth extraction     Family History  Problem Relation Age of Onset  . Hypertension Mother   . Diverticulitis Mother   . Hyperlipidemia Mother     Social History History  Substance Use Topics  . Smoking status: Current Every Day Smoker -- 1.0 packs/day for 8 years    Types: Cigarettes  . Smokeless tobacco: Former Neurosurgeon   Comment: has used Radiographer, therapeutic in the past; counseled on smoking cessation  . Alcohol Use: 7.0 oz/week    14 drink(s) per week     social    Allergies  Allergen Reactions  . Ibuprofen Other (See Comments)    Childhood reaction of high fever    Current Outpatient Prescriptions  Medication Sig Dispense Refill  . etonogestrel-ethinyl estradiol (NUVARING) 0.12-0.015 MG/24HR vaginal ring Place ring vaginally and replace every month  1 each  12  . HYDROcodone-acetaminophen (NORCO/VICODIN) 5-325 MG per tablet Take 1 tablet by mouth every 6 (six) hours as needed.      . ondansetron (ZOFRAN-ODT) 8 MG disintegrating tablet       . dicyclomine (BENTYL) 20 MG tablet Take 1 tablet (20 mg total) by mouth every 6 (six) hours. Prn spasms  60 tablet  0  . methylPREDNIsolone (MEDROL DOSPACK)  4 MG tablet       . Omeprazole (PRILOSEC PO) Take by mouth.        Review of Systems Review of Systems  Constitutional: Negative for fever, chills and unexpected weight change.  HENT: Negative for hearing loss, congestion, sore throat, trouble swallowing and voice change.   Eyes: Negative for visual disturbance.  Respiratory: Negative for cough and wheezing.   Cardiovascular: Negative for chest pain, palpitations and leg swelling.  Gastrointestinal: Positive for nausea and abdominal pain. Negative for vomiting, diarrhea, constipation, blood in stool, abdominal distention and anal bleeding.  Genitourinary: Negative for hematuria, vaginal bleeding and difficulty urinating.  Musculoskeletal: Negative for arthralgias.  Skin: Negative for rash and wound.  Neurological: Negative for seizures, syncope and headaches.  Hematological: Negative for adenopathy. Does not bruise/bleed easily.  Psychiatric/Behavioral: Negative for confusion.    Blood pressure 118/70, pulse 86, temperature 97.5 F (36.4 C), temperature source Temporal, resp. rate 18, height 5\' 7"  (1.702 m), weight 126 lb (57.153 kg), last menstrual period 04/10/2011, SpO2 100.00%.  Physical Exam Physical Exam  Constitutional: She is oriented to person, place, and time. She appears well-developed and well-nourished. No distress.  HENT:  Head: Normocephalic and atraumatic.  Right Ear: External ear normal.  Left Ear: External ear normal.  Nose: Nose normal.  Mouth/Throat: Oropharynx is clear and moist. No oropharyngeal exudate.  Eyes: Conjunctivae normal are normal. Pupils are equal, round, and reactive to light. Right eye exhibits no discharge. Left eye exhibits no discharge.  Neck: Normal range of motion. Neck supple. No tracheal deviation present. No thyromegaly present.  Cardiovascular: Normal rate, normal heart sounds and intact distal pulses.   No murmur heard. Pulmonary/Chest: Breath sounds normal. No respiratory distress.  She has no wheezes. She has no rales.  Abdominal: Soft. Bowel sounds are normal. She exhibits no distension. There is no tenderness. There is no rebound.  Musculoskeletal: Normal range of motion. She exhibits no edema and no tenderness.  Lymphadenopathy:    She has no cervical adenopathy.  Neurological: She is alert and oriented to person, place, and time.  Skin: Skin is warm and dry. No rash noted. She is not diaphoretic. No erythema.  Psychiatric: Her behavior is normal. Judgment normal.    Data Reviewed I have reviewed her ultrasound which suggests a small gallstone. Her HIDA scan shows a 6.4% ejection fraction. Liver function tests are normal  Assessment    Symptomatic cholelithiasis and biliary dyskinesia    Plan    Laparoscopic cholecystectomy is recommended. I discussed this with her in detail. I discussed the risk of surgery which includes but is not limited to bleeding, infection, bile duct injury, bile leak, need to convert to an open procedure, the chance this may not resolve all her symptoms, etc. She understands and wishes to proceed. Likelihood of success is good       Jahaan Vanwagner A 09/25/2011, 10:00 AM

## 2011-10-04 ENCOUNTER — Telehealth (INDEPENDENT_AMBULATORY_CARE_PROVIDER_SITE_OTHER): Payer: Self-pay

## 2011-10-04 NOTE — Telephone Encounter (Signed)
Pt called stating that oxycodone not holding discomfort. Pt has upcoming surgery scheduled to have gallbladder removed.  Pt states she has episodes where discomfort is more controlled at times but last night was not fully controlled. No vomiting. No fever. Pt advised to suppliment with aleve twice a day and diet reviewed with pt. Pt advised to call or go to ER if pain becomes severe, vomiting or fever occur. Pt states she understands.

## 2011-10-12 ENCOUNTER — Encounter (HOSPITAL_COMMUNITY): Payer: Self-pay | Admitting: Pharmacy Technician

## 2011-10-15 ENCOUNTER — Encounter (HOSPITAL_COMMUNITY): Payer: Self-pay

## 2011-10-15 ENCOUNTER — Encounter (HOSPITAL_COMMUNITY)
Admission: RE | Admit: 2011-10-15 | Discharge: 2011-10-15 | Disposition: A | Discharge status: Home / Self Care | Payer: BC Managed Care – PPO | Source: Ambulatory Visit | Attending: Surgery | Admitting: Surgery

## 2011-10-15 HISTORY — DX: Panic disorder (episodic paroxysmal anxiety): F41.0

## 2011-10-15 HISTORY — DX: Personal history of other diseases of the respiratory system: Z87.09

## 2011-10-15 HISTORY — DX: Anxiety disorder, unspecified: F41.9

## 2011-10-15 HISTORY — DX: Personal history of other diseases of the nervous system and sense organs: Z86.69

## 2011-10-15 LAB — CBC
HCT: 43.4 % (ref 36.0–46.0)
Hemoglobin: 14.6 g/dL (ref 12.0–15.0)
MCHC: 33.6 g/dL (ref 30.0–36.0)
WBC: 6.9 10*3/uL (ref 4.0–10.5)

## 2011-10-15 LAB — SURGICAL PCR SCREEN
MRSA, PCR: NEGATIVE
Staphylococcus aureus: NEGATIVE

## 2011-10-15 NOTE — Progress Notes (Signed)
Confirmed dopplers were done at 9:00 this morning PFT scheduled for 1430 today

## 2011-10-15 NOTE — Progress Notes (Signed)
Pt doesn't have a  cardiolologist  Pt doesn't have a medical MD  Denies ever having an echo/stress test/heart cath  EKG in epic from 07/19/11 CXR done @ [facility] off of [address](went in d/t thinking a kidney stone)

## 2011-10-15 NOTE — Pre-Procedure Instructions (Signed)
22 Krista Lee  10/15/2011   Your procedure is scheduled on:  Mon, Oct 21 @ 9:00 AM  Report to [facility] at 6:00 AM.  Call this number if you have problems the morning of surgery: [phone]   Remember:   Do not eat food:After Midnight.    Take these medicines the morning of surgery with A SIP OF WATER: Zofran(if needed)   Do not wear jewelry, make-up or nail polish.  Do not wear lotions, powders, or perfumes. You may wear deodorant.  Do not shave 48 hours prior to surgery. .  Do not bring valuables to the hospital.  Contacts, dentures or bridgework may not be worn into surgery.  Leave suitcase in the car. After surgery it may be brought to your room.  For patients admitted to the hospital, checkout time is 11:00 AM the day of discharge.   Patients discharged the day of surgery will not be allowed to drive home.  Special Instructions: Shower using CHG 2 nights before surgery and the night before surgery.  If you shower the day of surgery use CHG.  Use special wash - you have one bottle of CHG for all showers.  You should use approximately 1/3 of the bottle for each shower.   Please read over the following fact sheets that you were given: Pain Booklet, Coughing and Deep Breathing, MRSA Information and Surgical Site Infection Prevention

## 2011-10-21 NOTE — H&P (Signed)
Patient ID: Krista Lee, female DOB: 1985-06-09, 26 y.o. MRN: [id number]  Chief Complaint   Patient presents with   .  New Evaluation     G.B    HPI  Krista Lee is a 26 y.o. female.  HPI  This is a very pleasant female referred by Dr. Elnoria Howard for evaluation of biliary problems. She actually started having left upper quadrant abdominal pain first. It has now moved to the epigastric and right upper quadrant it is worse with fatty meals. She has pain almost daily. She has only mild nausea. Bowel movements are normal. She has no emesis. Her pain is moderate to severe.  Past Medical History   Diagnosis  Date   .  ADD (attention deficit disorder)    .  Allergy    .  GERD (gastroesophageal reflux disease)    .  Insomnia    .  Anemia      as adolescent    Past Surgical History   Procedure  Date   .  Wisdom tooth extraction     Family History   Problem  Relation  Age of Onset   .  Hypertension  Mother    .  Diverticulitis  Mother    .  Hyperlipidemia  Mother     Social History  History   Substance Use Topics   .  Smoking status:  Current Every Day Smoker -- 1.0 packs/day for 8 years     Types:  Cigarettes   .  Smokeless tobacco:  Former Neurosurgeon     Comment: has used Radiographer, therapeutic in the past; counseled on smoking cessation    .  Alcohol Use:  7.0 oz/week     14 drink(s) per week      social    Allergies   Allergen  Reactions   .  Ibuprofen  Other (See Comments)     Childhood reaction of high fever    Current Outpatient Prescriptions   Medication  Sig  Dispense  Refill   .  etonogestrel-ethinyl estradiol (NUVARING) 0.12-0.015 MG/24HR vaginal ring  Place ring vaginally and replace every month  1 each  12   .  HYDROcodone-acetaminophen (NORCO/VICODIN) 5-325 MG per tablet  Take 1 tablet by mouth every 6 (six) hours as needed.     .  ondansetron (ZOFRAN-ODT) 8 MG disintegrating tablet      .  dicyclomine (BENTYL) 20 MG tablet  Take 1 tablet (20 mg total) by mouth every 6 (six) hours.  Prn spasms  60 tablet  0   .  methylPREDNIsolone (MEDROL DOSPACK) 4 MG tablet      .  Omeprazole (PRILOSEC PO)  Take by mouth.      Review of Systems  Review of Systems  Constitutional: Negative for fever, chills and unexpected weight change.  HENT: Negative for hearing loss, congestion, sore throat, trouble swallowing and voice change.  Eyes: Negative for visual disturbance.  Respiratory: Negative for cough and wheezing.  Cardiovascular: Negative for chest pain, palpitations and leg swelling.  Gastrointestinal: Positive for nausea and abdominal pain. Negative for vomiting, diarrhea, constipation, blood in stool, abdominal distention and anal bleeding.  Genitourinary: Negative for hematuria, vaginal bleeding and difficulty urinating.  Musculoskeletal: Negative for arthralgias.  Skin: Negative for rash and wound.  Neurological: Negative for seizures, syncope and headaches.  Hematological: Negative for adenopathy. Does not bruise/bleed easily.  Psychiatric/Behavioral: Negative for confusion.   Blood pressure 118/70, pulse 86, temperature 97.5 F (36.4 C), temperature source  Temporal, resp. rate 18, height 5\' 7"  (1.702 m), weight 126 lb (57.153 kg), last menstrual period 04/10/2011, SpO2 100.00%.  Physical Exam  Physical Exam  Constitutional: She is oriented to person, place, and time. She appears well-developed and well-nourished. No distress.  HENT:  Head: Normocephalic and atraumatic.  Right Ear: External ear normal.  Left Ear: External ear normal.  Nose: Nose normal.  Mouth/Throat: Oropharynx is clear and moist. No oropharyngeal exudate.  Eyes: Conjunctivae normal are normal. Pupils are equal, round, and reactive to light. Right eye exhibits no discharge. Left eye exhibits no discharge.  Neck: Normal range of motion. Neck supple. No tracheal deviation present. No thyromegaly present.  Cardiovascular: Normal rate, normal heart sounds and intact distal pulses.  No murmur heard.    Pulmonary/Chest: Breath sounds normal. No respiratory distress. She has no wheezes. She has no rales.  Abdominal: Soft. Bowel sounds are normal. She exhibits no distension. There is no tenderness. There is no rebound.  Musculoskeletal: Normal range of motion. She exhibits no edema and no tenderness.  Lymphadenopathy:  She has no cervical adenopathy.  Neurological: She is alert and oriented to person, place, and time.  Skin: Skin is warm and dry. No rash noted. She is not diaphoretic. No erythema.  Psychiatric: Her behavior is normal. Judgment normal.   Data Reviewed  I have reviewed her ultrasound which suggests a small gallstone. Her HIDA scan shows a 6.4% ejection fraction. Liver function tests are normal  Assessment   Symptomatic cholelithiasis and biliary dyskinesia   Plan   Laparoscopic cholecystectomy is recommended. I discussed this with her in detail. I discussed the risk of surgery which includes but is not limited to bleeding, infection, bile duct injury, bile leak, need to convert to an open procedure, the chance this may not resolve all her symptoms, etc. She understands and wishes to proceed. Likelihood of success is good   Dekisha Mesmer A

## 2011-10-22 ENCOUNTER — Ambulatory Visit (HOSPITAL_COMMUNITY)
Admission: RE | Admit: 2011-10-22 | Discharge: 2011-10-22 | Disposition: A | Discharge status: Home / Self Care | Payer: BC Managed Care – PPO | Source: Ambulatory Visit | Attending: Surgery | Admitting: Surgery

## 2011-10-22 ENCOUNTER — Encounter (HOSPITAL_COMMUNITY): Payer: Self-pay | Admitting: Anesthesiology

## 2011-10-22 ENCOUNTER — Encounter (HOSPITAL_COMMUNITY)
Admission: RE | Disposition: A | Discharge status: Home / Self Care | Payer: Self-pay | Source: Ambulatory Visit | Attending: Surgery

## 2011-10-22 ENCOUNTER — Encounter (HOSPITAL_COMMUNITY): Payer: Self-pay | Admitting: *Deleted

## 2011-10-22 ENCOUNTER — Ambulatory Visit (HOSPITAL_COMMUNITY): Payer: BC Managed Care – PPO | Admitting: Anesthesiology

## 2011-10-22 DIAGNOSIS — K828 Other specified diseases of gallbladder: Secondary | ICD-10-CM | POA: Insufficient documentation

## 2011-10-22 DIAGNOSIS — K8010 Calculus of gallbladder with chronic cholecystitis without obstruction: Secondary | ICD-10-CM

## 2011-10-22 DIAGNOSIS — Z01812 Encounter for preprocedural laboratory examination: Secondary | ICD-10-CM | POA: Insufficient documentation

## 2011-10-22 HISTORY — PX: CHOLECYSTECTOMY: SHX55

## 2011-10-22 SURGERY — LAPAROSCOPIC CHOLECYSTECTOMY
Anesthesia: General | Site: Abdomen | Wound class: Clean Contaminated

## 2011-10-22 MED ORDER — ONDANSETRON HCL 4 MG/2ML IJ SOLN: 4.0000 mg | Freq: Four times a day (QID) | INTRAMUSCULAR | Status: DC | PRN

## 2011-10-22 MED ORDER — CEFAZOLIN SODIUM-DEXTROSE 2-3 GM-% IV SOLR
2.0000 g | Freq: Once | INTRAVENOUS | Status: AC
  Administered 2011-10-22: 2 g via INTRAVENOUS
  Filled 2011-10-22: qty 50

## 2011-10-22 MED ORDER — BUPIVACAINE-EPINEPHRINE PF 0.25-1:200000 % IJ SOLN
INTRAMUSCULAR | Status: AC
  Filled 2011-10-22: qty 30

## 2011-10-22 MED ORDER — PROMETHAZINE HCL 12.5 MG PO TABS: 12.5000 mg | ORAL_TABLET | Freq: Four times a day (QID) | ORAL | Status: DC | PRN

## 2011-10-22 MED ORDER — HYDROMORPHONE HCL PF 1 MG/ML IJ SOLN
INTRAMUSCULAR | Status: AC
  Administered 2011-10-22: 0.5 mg via INTRAVENOUS
  Filled 2011-10-22: qty 1

## 2011-10-22 MED ORDER — SODIUM CHLORIDE 0.9 % IJ SOLN: 3.0000 mL | Freq: Two times a day (BID) | INTRAMUSCULAR | Status: DC

## 2011-10-22 MED ORDER — DEXAMETHASONE SODIUM PHOSPHATE 10 MG/ML IJ SOLN
INTRAMUSCULAR | Status: DC | PRN
  Administered 2011-10-22: 10 mg via INTRAVENOUS

## 2011-10-22 MED ORDER — CEFAZOLIN SODIUM-DEXTROSE 2-3 GM-% IV SOLR
INTRAVENOUS | Status: AC
  Filled 2011-10-22: qty 50

## 2011-10-22 MED ORDER — LIDOCAINE HCL (CARDIAC) 20 MG/ML IV SOLN
INTRAVENOUS | Status: DC | PRN
  Administered 2011-10-22: 80 mg via INTRAVENOUS

## 2011-10-22 MED ORDER — ONDANSETRON HCL 4 MG/2ML IJ SOLN
INTRAMUSCULAR | Status: DC | PRN
  Administered 2011-10-22: 4 mg via INTRAVENOUS

## 2011-10-22 MED ORDER — OXYCODONE HCL 5 MG/5ML PO SOLN: 5.0000 mg | Freq: Once | ORAL | Status: AC | PRN

## 2011-10-22 MED ORDER — GLYCOPYRROLATE 0.2 MG/ML IJ SOLN
INTRAMUSCULAR | Status: DC | PRN
  Administered 2011-10-22: 0.4 mg via INTRAVENOUS

## 2011-10-22 MED ORDER — SUCCINYLCHOLINE CHLORIDE 20 MG/ML IJ SOLN
INTRAMUSCULAR | Status: DC | PRN
  Administered 2011-10-22: 100 mg via INTRAVENOUS

## 2011-10-22 MED ORDER — ONDANSETRON HCL 4 MG/2ML IJ SOLN
4.0000 mg | Freq: Four times a day (QID) | INTRAMUSCULAR | Status: AC | PRN
  Administered 2011-10-22: 4 mg via INTRAVENOUS

## 2011-10-22 MED ORDER — MIDAZOLAM HCL 5 MG/5ML IJ SOLN
INTRAMUSCULAR | Status: DC | PRN
  Administered 2011-10-22: 2 mg via INTRAVENOUS

## 2011-10-22 MED ORDER — FENTANYL CITRATE 0.05 MG/ML IJ SOLN
INTRAMUSCULAR | Status: DC | PRN
  Administered 2011-10-22 (×7): 50 ug via INTRAVENOUS

## 2011-10-22 MED ORDER — MORPHINE SULFATE 4 MG/ML IJ SOLN
4.0000 mg | INTRAMUSCULAR | Status: DC | PRN
  Administered 2011-10-22: 4 mg via INTRAVENOUS
  Filled 2011-10-22: qty 1

## 2011-10-22 MED ORDER — NEOSTIGMINE METHYLSULFATE 1 MG/ML IJ SOLN
INTRAMUSCULAR | Status: DC | PRN
  Administered 2011-10-22: 2.5 mg via INTRAVENOUS

## 2011-10-22 MED ORDER — 0.9 % SODIUM CHLORIDE (POUR BTL) OPTIME
TOPICAL | Status: DC | PRN
  Administered 2011-10-22: 1000 mL

## 2011-10-22 MED ORDER — ROCURONIUM BROMIDE 100 MG/10ML IV SOLN
INTRAVENOUS | Status: DC | PRN
  Administered 2011-10-22: 20 mg via INTRAVENOUS

## 2011-10-22 MED ORDER — ACETAMINOPHEN 325 MG PO TABS: 650.0000 mg | ORAL_TABLET | ORAL | Status: DC | PRN

## 2011-10-22 MED ORDER — SODIUM CHLORIDE 0.9 % IJ SOLN: 3.0000 mL | INTRAMUSCULAR | Status: DC | PRN

## 2011-10-22 MED ORDER — HYDROCODONE-ACETAMINOPHEN 5-325 MG PO TABS: 1.0000 | ORAL_TABLET | ORAL | Status: DC | PRN

## 2011-10-22 MED ORDER — HYDROMORPHONE HCL PF 1 MG/ML IJ SOLN
0.5000 mg | INTRAMUSCULAR | Status: DC | PRN
  Administered 2011-10-22: 0.5 mg via INTRAVENOUS

## 2011-10-22 MED ORDER — ACETAMINOPHEN 650 MG RE SUPP: 650.0000 mg | RECTAL | Status: DC | PRN

## 2011-10-22 MED ORDER — HYDROCODONE-ACETAMINOPHEN 5-325 MG PO TABS
1.0000 | ORAL_TABLET | ORAL | Status: DC | PRN
Start: 2011-10-22 — End: 2011-10-22
  Administered 2011-10-22 (×2): 1 via ORAL
  Filled 2011-10-22 (×2): qty 1

## 2011-10-22 MED ORDER — SODIUM CHLORIDE 0.9 % IV SOLN: 250.0000 mL | INTRAVENOUS | Status: DC | PRN

## 2011-10-22 MED ORDER — OXYCODONE HCL 5 MG PO TABS
5.0000 mg | ORAL_TABLET | Freq: Once | ORAL | Status: AC | PRN
  Administered 2011-10-22: 5 mg via ORAL
  Filled 2011-10-22: qty 1

## 2011-10-22 MED ORDER — LACTATED RINGERS IV SOLN
INTRAVENOUS | Status: DC | PRN
  Administered 2011-10-22: 07:00:00 via INTRAVENOUS

## 2011-10-22 MED ORDER — HYDROMORPHONE HCL PF 1 MG/ML IJ SOLN
0.2500 mg | INTRAMUSCULAR | Status: DC | PRN
  Administered 2011-10-22 (×4): 0.5 mg via INTRAVENOUS

## 2011-10-22 MED ORDER — SODIUM CHLORIDE 0.9 % IR SOLN
Status: DC | PRN
  Administered 2011-10-22: 1000 mL

## 2011-10-22 MED ORDER — PROPOFOL 10 MG/ML IV BOLUS
INTRAVENOUS | Status: DC | PRN
  Administered 2011-10-22: 180 mg via INTRAVENOUS

## 2011-10-22 MED ORDER — BUPIVACAINE-EPINEPHRINE 0.25% -1:200000 IJ SOLN
INTRAMUSCULAR | Status: DC | PRN
  Administered 2011-10-22: 20 mL

## 2011-10-22 MED ORDER — ONDANSETRON HCL 4 MG/2ML IJ SOLN
INTRAMUSCULAR | Status: AC
  Filled 2011-10-22: qty 2

## 2011-10-22 SURGICAL SUPPLY — 45 items
APL SKNCLS STERI-STRIP NONHPOA (GAUZE/BANDAGES/DRESSINGS) ×1
APPLIER CLIP 5 13 M/L LIGAMAX5 (MISCELLANEOUS) ×2
APR CLP MED LRG 5 ANG JAW (MISCELLANEOUS) ×1
BAG SPEC RTRVL LRG 6X4 10 (ENDOMECHANICALS) ×1
BANDAGE ADHESIVE 1X3 (GAUZE/BANDAGES/DRESSINGS) ×8 IMPLANT
BENZOIN TINCTURE PRP APPL 2/3 (GAUZE/BANDAGES/DRESSINGS) ×2 IMPLANT
CANISTER SUCTION 2500CC (MISCELLANEOUS) ×2 IMPLANT
CHLORAPREP W/TINT 26ML (MISCELLANEOUS) ×2 IMPLANT
CLIP APPLIE 5 13 M/L LIGAMAX5 (MISCELLANEOUS) ×1 IMPLANT
CLOTH BEACON ORANGE TIMEOUT ST (SAFETY) ×2 IMPLANT
COVER MAYO STAND STRL (DRAPES) IMPLANT
COVER SURGICAL LIGHT HANDLE (MISCELLANEOUS) ×2 IMPLANT
DECANTER SPIKE VIAL GLASS SM (MISCELLANEOUS) ×2 IMPLANT
DRAPE C-ARM 42X72 X-RAY (DRAPES) IMPLANT
ELECT REM PT RETURN 9FT ADLT (ELECTROSURGICAL) ×2
ELECTRODE REM PT RTRN 9FT ADLT (ELECTROSURGICAL) ×1 IMPLANT
GLOVE BIO SURGEON STRL SZ 6.5 (GLOVE) ×2 IMPLANT
GLOVE BIO SURGEON STRL SZ7.5 (GLOVE) ×1 IMPLANT
GLOVE BIOGEL PI IND STRL 6.5 (GLOVE) IMPLANT
GLOVE BIOGEL PI IND STRL 7.0 (GLOVE) IMPLANT
GLOVE BIOGEL PI IND STRL 7.5 (GLOVE) IMPLANT
GLOVE BIOGEL PI INDICATOR 6.5 (GLOVE) ×1
GLOVE BIOGEL PI INDICATOR 7.0 (GLOVE) ×1
GLOVE BIOGEL PI INDICATOR 7.5 (GLOVE) ×1
GLOVE ECLIPSE 6.5 STRL STRAW (GLOVE) ×1 IMPLANT
GLOVE SURG SIGNA 7.5 PF LTX (GLOVE) ×2 IMPLANT
GOWN PREVENTION PLUS XLARGE (GOWN DISPOSABLE) ×2 IMPLANT
GOWN STRL NON-REIN LRG LVL3 (GOWN DISPOSABLE) ×7 IMPLANT
KIT BASIN OR (CUSTOM PROCEDURE TRAY) ×2 IMPLANT
KIT ROOM TURNOVER OR (KITS) ×2 IMPLANT
NS IRRIG 1000ML POUR BTL (IV SOLUTION) ×2 IMPLANT
PAD ARMBOARD 7.5X6 YLW CONV (MISCELLANEOUS) ×4 IMPLANT
POUCH SPECIMEN RETRIEVAL 10MM (ENDOMECHANICALS) ×1 IMPLANT
SCISSORS LAP 5X35 DISP (ENDOMECHANICALS) IMPLANT
SET CHOLANGIOGRAPH 5 50 .035 (SET/KITS/TRAYS/PACK) IMPLANT
SET IRRIG TUBING LAPAROSCOPIC (IRRIGATION / IRRIGATOR) ×2 IMPLANT
SLEEVE ENDOPATH XCEL 5M (ENDOMECHANICALS) ×4 IMPLANT
SPECIMEN JAR SMALL (MISCELLANEOUS) ×2 IMPLANT
SUT MON AB 4-0 PC3 18 (SUTURE) ×2 IMPLANT
TOWEL OR 17X24 6PK STRL BLUE (TOWEL DISPOSABLE) ×2 IMPLANT
TOWEL OR 17X26 10 PK STRL BLUE (TOWEL DISPOSABLE) ×2 IMPLANT
TRAY LAPAROSCOPIC (CUSTOM PROCEDURE TRAY) ×2 IMPLANT
TROCAR XCEL BLUNT TIP 100MML (ENDOMECHANICALS) ×2 IMPLANT
TROCAR XCEL NON-BLD 5MMX100MML (ENDOMECHANICALS) ×2 IMPLANT
WATER STERILE IRR 1000ML POUR (IV SOLUTION) IMPLANT

## 2011-10-22 NOTE — Anesthesia Postprocedure Evaluation (Signed)
Anesthesia Post Note  Patient: Krista Lee  Procedure(s) Performed: Procedure(s) (LRB): LAPAROSCOPIC CHOLECYSTECTOMY (N/A)  Anesthesia type: General  Patient location: PACU  Post pain: Pain level controlled and Adequate analgesia  Post assessment: Post-op Vital signs reviewed, Patient's Cardiovascular Status Stable, Respiratory Function Stable, Patent Airway and Pain level controlled  Last Vitals:  Filed Vitals:   10/22/11 1000  BP:   Pulse: 96  Temp: 36.5 C  Resp: 15    Post vital signs: Reviewed and stable  Level of consciousness: awake, alert  and oriented  Complications: No apparent anesthesia complications

## 2011-10-22 NOTE — Progress Notes (Signed)
STATES AFTER 2ND PAIN PILL STATES PAIN DECREASED TO 4/10; HAS VOIDED; NO C/O NAUSEA

## 2011-10-22 NOTE — Transfer of Care (Signed)
Immediate Anesthesia Transfer of Care Note  Patient: Krista Lee  Procedure(s) Performed: Procedure(s) (LRB) with comments: LAPAROSCOPIC CHOLECYSTECTOMY (N/A)  Patient Location: PACU  Anesthesia Type: General  Level of Consciousness: awake, alert , oriented and patient cooperative  Airway & Oxygen Therapy: Patient Spontanous Breathing and Patient connected to nasal cannula oxygen  Post-op Assessment: Report given to PACU RN, Post -op Vital signs reviewed and stable and Patient moving all extremities X 4  Post vital signs: Reviewed and stable  Complications: No apparent anesthesia complications

## 2011-10-22 NOTE — Interval H&P Note (Signed)
History and Physical Interval Note: no change in H and P  10/22/2011 7:11 AM  Krista Lee  has presented today for surgery, with the diagnosis of gallstone and dyskinesia  The various methods of treatment have been discussed with the patient and family. After consideration of risks, benefits and other options for treatment, the patient has consented to  Procedure(s) (LRB) with comments: LAPAROSCOPIC CHOLECYSTECTOMY (N/A) as a surgical intervention .  The patient's history has been reviewed, patient examined, no change in status, stable for surgery.  I have reviewed the patient's chart and labs.  Questions were answered to the patient's satisfaction.     Aison Malveaux A

## 2011-10-22 NOTE — Progress Notes (Signed)
Notified Dr. Phoebe Perch of pt. Body piercing. Pt. Has a glass retainer on the right side of the nose and on on the lower left lip. States the nose piercing doesn't come out and she doesn't want to take the lip piercing out . Pt. Also has wood earring in both ears.

## 2011-10-22 NOTE — Anesthesia Procedure Notes (Signed)
Performed by: Pieter Fooks A       

## 2011-10-22 NOTE — Anesthesia Preprocedure Evaluation (Addendum)
Anesthesia Evaluation  Patient identified by MRN, date of birth, ID band Patient awake    Reviewed: Allergy & Precautions, H&P , NPO status , Patient's Chart, lab work & pertinent test results  History of Anesthesia Complications Negative for: history of anesthetic complications  Airway Mallampati: I TM Distance: >3 FB Neck ROM: Full    Dental  (+) Teeth Intact and Dental Advisory Given   Pulmonary neg pulmonary ROS,          Cardiovascular negative cardio ROS      Neuro/Psych Anxiety Depression ADDnegative neurological ROS     GI/Hepatic negative GI ROS, Neg liver ROS,   Endo/Other  negative endocrine ROS  Renal/GU negative Renal ROS     Musculoskeletal negative musculoskeletal ROS (+)   Abdominal   Peds negative pediatric ROS (+) ATTENTION DEFICIT DISORDER WITHOUT HYPERACTIVITY Hematology negative hematology ROS (+)   Anesthesia Other Findings   Reproductive/Obstetrics negative OB ROS                          Anesthesia Physical Anesthesia Plan  ASA: II  Anesthesia Plan: General   Post-op Pain Management:    Induction: Intravenous  Airway Management Planned: Oral ETT  Additional Equipment:   Intra-op Plan:   Post-operative Plan: Extubation in OR  Informed Consent: I have reviewed the patients History and Physical, chart, labs and discussed the procedure including the risks, benefits and alternatives for the proposed anesthesia with the patient or authorized representative who has indicated his/her understanding and acceptance.     Plan Discussed with: CRNA and Surgeon  Anesthesia Plan Comments:         Anesthesia Quick Evaluation

## 2011-10-22 NOTE — Op Note (Signed)
Laparoscopic Cholecystectomy Procedure Note  Indications: This patient presents with symptomatic gallbladder disease and will undergo laparoscopic cholecystectomy.  Pre-operative Diagnosis: biliary dyskinesia  Post-operative Diagnosis: Same  Surgeon: Abigail Miyamoto A   Assistants: 0  Anesthesia: General endotracheal anesthesia  ASA Class: 1  Procedure Details  The patient was seen again in the Holding Room. The risks, benefits, complications, treatment options, and expected outcomes were discussed with the patient. The possibilities of reaction to medication, pulmonary aspiration, perforation of viscus, bleeding, recurrent infection, finding a normal gallbladder, the need for additional procedures, failure to diagnose a condition, the possible need to convert to an open procedure, and creating a complication requiring transfusion or operation were discussed with the patient. The likelihood of improving the patient's symptoms with return to their baseline status is good.  The patient and/or family concurred with the proposed plan, giving informed consent. The site of surgery properly noted. The patient was taken to Operating Room, identified as Krista Lee and the procedure verified as Laparoscopic Cholecystectomy with Intraoperative Cholangiogram. A Time Out was held and the above information confirmed.  Prior to the induction of general anesthesia, antibiotic prophylaxis was administered. General endotracheal anesthesia was then administered and tolerated well. After the induction, the abdomen was prepped with Chloraprep and draped in sterile fashion. The patient was positioned in the supine position.  Local anesthetic agent was injected into the skin near the umbilicus and an incision made. We dissected down to the abdominal fascia with blunt dissection.  The fascia was incised vertically and we entered the peritoneal cavity bluntly.  A pursestring suture of 0-Vicryl was placed around the  fascial opening.  The Hasson cannula was inserted and secured with the stay suture.  Pneumoperitoneum was then created with CO2 and tolerated well without any adverse changes in the patient's vital signs. An 11-mm port was placed in the subxiphoid position.  Two 5-mm ports were placed in the right upper quadrant. All skin incisions were infiltrated with a local anesthetic agent before making the incision and placing the trocars.   We positioned the patient in reverse Trendelenburg, tilted slightly to the patient's left.  The gallbladder was identified, the fundus grasped and retracted cephalad. Adhesions were lysed bluntly and with the electrocautery where indicated, taking care not to injure any adjacent organs or viscus. The infundibulum was grasped and retracted laterally, exposing the peritoneum overlying the triangle of Calot. This was then divided and exposed in a blunt fashion. The cystic duct was clearly identified and bluntly dissected circumferentially. A critical view of the cystic duct and cystic artery was obtained.  The cystic duct was then ligated with clips and divided. The cystic artery was, dissected free, ligated with clips and divided as well.   The gallbladder was dissected from the liver bed in retrograde fashion with the electrocautery. The gallbladder was removed and placed in an Endocatch sac. The liver bed was irrigated and inspected. Hemostasis was achieved with the electrocautery. Copious irrigation was utilized and was repeatedly aspirated until clear.  The gallbladder and Endocatch sac were then removed through the umbilical port site.  The pursestring suture was used to close the umbilical fascia.    We again inspected the right upper quadrant for hemostasis.  Pneumoperitoneum was released as we removed the trocars.  4-0 Monocryl was used to close the skin.   Benzoin, steri-strips, and clean dressings were applied. The patient was then extubated and brought to the recovery room  in stable condition. Instrument, sponge, and needle  counts were correct at closure and at the conclusion of the case.   Findings: Chronic Cholecystitis   Estimated Blood Loss: Minimal         Drains: 0         Specimens: Gallbladder           Complications: None; patient tolerated the procedure well.         Disposition: PACU - hemodynamically stable.         Condition: stable

## 2011-10-23 ENCOUNTER — Encounter (HOSPITAL_COMMUNITY): Payer: Self-pay | Admitting: Surgery

## 2011-10-23 ENCOUNTER — Telehealth (INDEPENDENT_AMBULATORY_CARE_PROVIDER_SITE_OTHER): Payer: Self-pay | Admitting: General Surgery

## 2011-10-23 NOTE — Telephone Encounter (Signed)
Patient called in stating that she was s/p lap chole performed by dr.blackman 10/22/11 and she removed the band aid today which was saturated in blood along with the steri-strip too....the incision did not appear to be opened, infected, feverish or draining any foul odor at any other part of incision.Marland KitchenMarland KitchenI then instructed patient to dab the area with a dry gauze in which she stated that it didn't appear to be bleeding anymore and she would put a fresh bandage over the incision and call us back if incision started to bleed, drain any foul odor, become infected looking or start to spike any fevers...patient was ok with instructions and will otherwise keep her 11/4 appt with dr.blackman

## 2011-10-24 ENCOUNTER — Emergency Department (HOSPITAL_COMMUNITY): Payer: BC Managed Care – PPO

## 2011-10-24 ENCOUNTER — Emergency Department (HOSPITAL_COMMUNITY)
Admission: EM | Admit: 2011-10-24 | Discharge: 2011-10-24 | Disposition: A | Discharge status: Home / Self Care | Payer: BC Managed Care – PPO | Attending: Emergency Medicine | Admitting: Emergency Medicine

## 2011-10-24 ENCOUNTER — Encounter (HOSPITAL_COMMUNITY): Payer: Self-pay

## 2011-10-24 ENCOUNTER — Telehealth (INDEPENDENT_AMBULATORY_CARE_PROVIDER_SITE_OTHER): Payer: Self-pay | Admitting: General Surgery

## 2011-10-24 DIAGNOSIS — Z9889 Other specified postprocedural states: Secondary | ICD-10-CM | POA: Insufficient documentation

## 2011-10-24 DIAGNOSIS — K668 Other specified disorders of peritoneum: Secondary | ICD-10-CM | POA: Insufficient documentation

## 2011-10-24 DIAGNOSIS — F17200 Nicotine dependence, unspecified, uncomplicated: Secondary | ICD-10-CM | POA: Insufficient documentation

## 2011-10-24 DIAGNOSIS — J90 Pleural effusion, not elsewhere classified: Secondary | ICD-10-CM | POA: Insufficient documentation

## 2011-10-24 DIAGNOSIS — Z8659 Personal history of other mental and behavioral disorders: Secondary | ICD-10-CM | POA: Insufficient documentation

## 2011-10-24 DIAGNOSIS — Z79899 Other long term (current) drug therapy: Secondary | ICD-10-CM | POA: Insufficient documentation

## 2011-10-24 DIAGNOSIS — Z9049 Acquired absence of other specified parts of digestive tract: Secondary | ICD-10-CM

## 2011-10-24 DIAGNOSIS — K219 Gastro-esophageal reflux disease without esophagitis: Secondary | ICD-10-CM | POA: Insufficient documentation

## 2011-10-24 DIAGNOSIS — Z8709 Personal history of other diseases of the respiratory system: Secondary | ICD-10-CM | POA: Insufficient documentation

## 2011-10-24 DIAGNOSIS — R0602 Shortness of breath: Secondary | ICD-10-CM

## 2011-10-24 LAB — CBC WITH DIFFERENTIAL/PLATELET
Eosinophils Relative: 3 % (ref 0–5)
HCT: 38.5 % (ref 36.0–46.0)
Hemoglobin: 13.2 g/dL (ref 12.0–15.0)
Lymphocytes Relative: 45 % (ref 12–46)
Lymphs Abs: 3.9 10*3/uL (ref 0.7–4.0)
MCV: 89.5 fL (ref 78.0–100.0)
Monocytes Absolute: 0.7 10*3/uL (ref 0.1–1.0)
Monocytes Relative: 8 % (ref 3–12)
RBC: 4.3 MIL/uL (ref 3.87–5.11)
WBC: 8.7 10*3/uL (ref 4.0–10.5)

## 2011-10-24 LAB — BASIC METABOLIC PANEL
BUN: 6 mg/dL (ref 6–23)
Chloride: 104 mEq/L (ref 96–112)
GFR calc Af Amer: >90 mL/min (ref >90)
GFR calc non Af Amer: >90 mL/min (ref >90)

## 2011-10-24 MED ORDER — SODIUM CHLORIDE 0.9 % IV SOLN
INTRAVENOUS | Status: DC
  Administered 2011-10-24: 19:00:00 via INTRAVENOUS

## 2011-10-24 MED ORDER — IOHEXOL 350 MG/ML SOLN
100.0000 mL | Freq: Once | INTRAVENOUS | Status: AC | PRN
  Administered 2011-10-24: 100 mL via INTRAVENOUS

## 2011-10-24 NOTE — ED Notes (Signed)
Patient presents reporting shallow respirations when taking hydrocodone over past few days.  Patient is post-op from cholecystomy on Monday.  Patient called her surgeon who changed her medication, patient has not yet had that medication filled.  Patient concerned about her shallow respirations and is reporting shortness of breath intermittently.

## 2011-10-24 NOTE — ED Notes (Signed)
Pt is back in room from ct

## 2011-10-24 NOTE — ED Provider Notes (Signed)
History   This chart was scribed for Toy Baker, MD by Charolett Bumpers . The patient was seen in room TR07C/TR07C. Patient's care was started at 1725.   CSN: [id number] Arrival date & time 10/24/11  1605  First MD Initiated Contact with Patient 10/24/11 1725      Chief Complaint  Patient presents with  . Shortness of Breath   The history is provided by the patient. No language interpreter was used.   Krista Lee is a 26 y.o. female who presents to the Emergency Department complaining of intermittent SOB. She is post-op from a cholecystectomy on 10/22/11. She states that she was prescribed Hydrocodone and Phenergan. She states she noticed SOB last night after taking the medications. She states the SOB did not improve after the pain medication wore off. She states that she gasps for air while sleeping and has shallow breaths. She denies any h/o SOB. She states her SOB is aggravated with walking and deep breaths that causes her chest to hurt as well. She denies any leg pain or swelling.   Past Medical History  Diagnosis Date  . ADD (attention deficit disorder)   . Allergy   . Insomnia   . History of bronchitis     as a teenager  . History of migraine     can't remember when the last one was  . GERD (gastroesophageal reflux disease)     doesn't take any meds for this  . Anemia     as adolescent  . Anxiety   . Panic attacks     Past Surgical History  Procedure Date  . Wisdom tooth extraction 65yrs ago  . Cholecystectomy 10/22/2011    Procedure: LAPAROSCOPIC CHOLECYSTECTOMY;  Surgeon: Shelly Rubenstein, MD;  Location: [facility] OR;  Service: General;  Laterality: N/A;    Family History  Problem Relation Age of Onset  . Hypertension Mother   . Diverticulitis Mother   . Hyperlipidemia Mother     History  Substance Use Topics  . Smoking status: Current Every Day Smoker -- 1.0 packs/day for 8 years    Types: Cigarettes  . Smokeless tobacco: Former Neurosurgeon   Comment: has  used Radiographer, therapeutic in the past; counseled on smoking cessation  . Alcohol Use: 7.0 oz/week    14 drink(s) per week     2-3 glasses wine nightly    OB History    Grav Para Term Preterm Abortions TAB SAB Ect Mult Living                  Review of Systems  Constitutional: Negative for fever and chills.  Respiratory: Positive for shortness of breath.   Cardiovascular: Negative for chest pain and leg swelling.  Gastrointestinal: Negative for nausea and vomiting.  Neurological: Negative for weakness.  All other systems reviewed and are negative.    Allergies  Ibuprofen and Percocet  Home Medications   Current Outpatient Rx  Name Route Sig Dispense Refill  . ETONOGESTREL-ETHINYL ESTRADIOL 0.12-0.015 MG/24HR VA RING  Place ring vaginally and replace every month 1 each 12  . NAPROXEN SODIUM 220 MG PO TABS Oral Take 220 mg by mouth 2 (two) times daily with a meal.      BP 117/78  Pulse 80  Temp 98.4 F (36.9 C)  Resp 18  SpO2 100%  LMP 04/15/2011  Physical Exam  Nursing note and vitals reviewed. Constitutional: She is oriented to person, place, and time. She appears well-developed and well-nourished.  Non-toxic  appearance. No distress.  HENT:  Head: Normocephalic and atraumatic.  Eyes: Conjunctivae normal, EOM and lids are normal. Pupils are equal, round, and reactive to light.  Neck: Normal range of motion. Neck supple. No tracheal deviation present. No mass present.  Cardiovascular: Normal rate, regular rhythm and normal heart sounds.  Exam reveals no gallop.   No murmur heard. Pulmonary/Chest: Effort normal and breath sounds normal. No stridor. No respiratory distress. She has no decreased breath sounds. She has no wheezes. She has no rhonchi. She has no rales.  Abdominal: Soft. Normal appearance and bowel sounds are normal. She exhibits distension. There is no CVA tenderness.       Surgical incisions are intact.   Musculoskeletal: Normal range of motion. She exhibits no edema  and no tenderness.  Neurological: She is alert and oriented to person, place, and time. She has normal strength. No cranial nerve deficit or sensory deficit. GCS eye subscore is 4. GCS verbal subscore is 5. GCS motor subscore is 6.  Skin: Skin is warm and dry. No abrasion and no rash noted.  Psychiatric: She has a normal mood and affect. Her speech is normal and behavior is normal.    ED Course  Procedures (including critical care time)  DIAGNOSTIC STUDIES: Oxygen Saturation is 100% on room air, normal by my interpretation.    COORDINATION OF CARE:  17:35-Discussed planned course of treatment with the patient including a CT of chest and blood work, who is agreeable at this time. Will move pt to CDU.    Labs Reviewed - No data to display No results found.   No diagnosis found.    MDM  Patient with pleuritic chest pain after surgery. We'll have a chest CT to rule out pulmonary embolism. Will be transferred to CDU to complete her workup   I personally performed the services described in this documentation, which was scribed in my presence. The recorded information has been reviewed and considered.     Toy Baker, MD 10/24/11 1740

## 2011-10-24 NOTE — ED Notes (Signed)
Patient transported to CT 

## 2011-10-24 NOTE — ED Provider Notes (Signed)
6:19 PM Assumed care of patient in the CDU from Dr. Freida Busman.  Patient is awaiting CTA to rule out PE.  Patient presented today with a chief complaint of SOB.  Patient had Cholecystectomy surgery two days ago.  The plan is for the patient to be discharged home if CTA is negative.   Date: 10/24/2011  Rate: 71  Rhythm: normal sinus rhythm  QRS Axis: normal  Intervals: normal  ST/T Wave abnormalities: normal  Conduction Disutrbances:none  Narrative Interpretation:   Old EKG Reviewed: none available  8:21 PM Results of the CT angio chest are as follows: IMPRESSION: 1. No evidence of pulmonary embolism. 2. Trace bilateral pleural effusions with minimal dependent atelectasis in the lower lobes of the lungs bilaterally. 3. Postoperative changes of recent cholecystectomy, including a small volume of persistent pneumoperitoneum. This is not unexpected given that the patient was operated on 10/22/2011.  Will discharge patient home.  She states that her symptoms have improved.  Patient alert and orientated x 3, Heart RRR, Lungs CTAB.      Pascal Lux [place], PA-C 10/25/11 0121

## 2011-10-24 NOTE — Telephone Encounter (Signed)
Pt called to report that she had experienced some heaviness with her breathing yesterday, she thinks associated with pain medication/ She called ER last night and they told her to contact her pharmacy. She took 1 pain medication/ Hydrocodone 5/325 at 1am and none since. Her breathing is better, not as labored, but does notice a catch with deep breaths/ I reviewed this with dr. Magnus Ivan He ordered Ultram 50mg  1-2 tabs/ #30 and said for pt to go to ER if symptoms do not continue to improve/ Continue regular activities and deep breathing/ Pt notified and Ultram called to [facility]/ [address]/[phone]/ pt aware/gy

## 2011-10-24 NOTE — ED Notes (Signed)
Patient denies any new pain to abdomen, she reports she is sore from the surgery.

## 2011-10-25 NOTE — ED Provider Notes (Signed)
Medical screening examination/treatment/procedure(s) were conducted as a shared visit with non-physician practitioner(s) and myself.  I personally evaluated the patient during the encounter  Toy Baker, MD 10/25/11 2223

## 2011-11-05 ENCOUNTER — Encounter (INDEPENDENT_AMBULATORY_CARE_PROVIDER_SITE_OTHER): Payer: Self-pay | Admitting: Surgery

## 2011-11-05 ENCOUNTER — Ambulatory Visit (INDEPENDENT_AMBULATORY_CARE_PROVIDER_SITE_OTHER): Payer: BC Managed Care – PPO | Admitting: Surgery

## 2011-11-05 VITALS — BP 112/60 | HR 88 | Temp 96.9°F | Resp 16 | Ht 66.0 in | Wt 125.8 lb

## 2011-11-05 DIAGNOSIS — Z09 Encounter for follow-up examination after completed treatment for conditions other than malignant neoplasm: Secondary | ICD-10-CM

## 2011-11-05 NOTE — Progress Notes (Signed)
Subjective:     Patient ID: Krista Lee, female   DOB: 02-14-1985, 26 y.o.   MRN: [id number]  HPI She is here for a postop visit status post laparoscopic cholecystectomy. She actually had to go to the emergency room 2 days postoperatively for shortness of breath. A workup for pulmonary embolism was negative. She is now doing well and has no complaints other than mild discomfort at the umbilicus. She is eating well moving her bowels well Review of Systems     Objective:   Physical Exam Her abdomen is soft and her incisions are well-healed   the final pathology showed chronic cholecystitis with cholelithiasis Assessment:     Patient stable status post laparoscopic cholecystectomy    Plan:     She may return to normal activity. I will see her back as needed

## 2012-02-06 ENCOUNTER — Ambulatory Visit (INDEPENDENT_AMBULATORY_CARE_PROVIDER_SITE_OTHER): Payer: BC Managed Care – PPO | Admitting: Family Medicine

## 2012-02-06 VITALS — BP 115/80 | HR 91 | Temp 98.4°F | Resp 16 | Ht 66.25 in | Wt 130.6 lb

## 2012-02-06 DIAGNOSIS — R059 Cough, unspecified: Secondary | ICD-10-CM

## 2012-02-06 DIAGNOSIS — R0981 Nasal congestion: Secondary | ICD-10-CM

## 2012-02-06 DIAGNOSIS — J3489 Other specified disorders of nose and nasal sinuses: Secondary | ICD-10-CM

## 2012-02-06 DIAGNOSIS — R05 Cough: Secondary | ICD-10-CM

## 2012-02-06 DIAGNOSIS — R509 Fever, unspecified: Secondary | ICD-10-CM

## 2012-02-06 DIAGNOSIS — J069 Acute upper respiratory infection, unspecified: Secondary | ICD-10-CM

## 2012-02-06 LAB — POCT INFLUENZA A/B: Influenza B, POC: NEGATIVE

## 2012-02-06 MED ORDER — HYDROCODONE-HOMATROPINE 5-1.5 MG/5ML PO SYRP: ORAL_SOLUTION | ORAL | Status: DC

## 2012-02-06 MED ORDER — AMOXICILLIN-POT CLAVULANATE 875-125 MG PO TABS: 1.0000 | ORAL_TABLET | Freq: Two times a day (BID) | ORAL | Status: DC

## 2012-02-06 NOTE — Progress Notes (Signed)
Subjective:    Patient ID: Krista Lee, female    DOB: [birth year], 27 y.o.   MRN: [id number]  HPI Krista Lee is a 27 y.o. female  Cough started 5 days ago - next night felt more sick.  2 days ago when woke up - felt worse.  runny nose, st, congestion, bodyaches, temp of 100.  Stayed out of work yesterday - temp 100.1.  Green nasal congestion, and cough with slight green phlegm yesterday, less body aches.  Sneezing, cough and nasal congestion today.  Temp 99.4 this am. Sore throat today. Mostly nasal congestion today. Waking up overnight frequently with congestion and cough.   Tx: otc tylenol multi symptom - feels like stays congested.   No flu vaccine this year. No known sick contacts at work, but dad sick day prior to her sx's.  Cough, bodyaches.   Review of Systems  Constitutional: Positive for fever and chills.  HENT: Positive for congestion, sore throat and sinus pressure.   Respiratory: Positive for cough. Negative for shortness of breath and wheezing.   Musculoskeletal: Positive for myalgias.   As above.     Objective:   Physical Exam  Constitutional: She is oriented to person, place, and time. She appears well-developed and well-nourished. No distress.  HENT:  Head: Atraumatic. Macrocephalic.  Right Ear: Hearing, tympanic membrane, external ear and ear canal normal.  Left Ear: Hearing, tympanic membrane, external ear and ear canal normal.  Nose: Rhinorrhea present. Right sinus exhibits no maxillary sinus tenderness and no frontal sinus tenderness. Left sinus exhibits no maxillary sinus tenderness.  Mouth/Throat: Uvula is midline, oropharynx is clear and moist and mucous membranes are normal. No oropharyngeal exudate, posterior oropharyngeal edema, posterior oropharyngeal erythema or tonsillar abscesses.  Eyes: Conjunctivae normal and EOM are normal. Pupils are equal, round, and reactive to light.  Neck: No thyromegaly present.  Cardiovascular: Normal rate, regular rhythm,  normal heart sounds and intact distal pulses.   No murmur heard. Pulmonary/Chest: Effort normal and breath sounds normal. No respiratory distress. She has no wheezes. She has no rhonchi.       Clear, no distress.  Few coughs during ov.   Lymphadenopathy:    She has no cervical adenopathy.  Neurological: She is alert and oriented to person, place, and time.  Skin: Skin is warm and dry. No rash noted.  Psychiatric: She has a normal mood and affect. Her behavior is normal.      Results for orders placed in visit on 02/06/12  POCT INFLUENZA A/B      Component Value Range   Influenza A, POC Negative     Influenza B, POC Negative         Assessment & Plan:  [name] is a 27 y.o. female 1. Fever  POCT Influenza A/B  2. Cough  POCT Influenza A/B, HYDROcodone-homatropine (HYCODAN) 5-1.5 MG/5ML syrup  3. URI (upper respiratory infection)    4. Sinus congestion  amoxicillin-clavulanate (AUGMENTIN) 875-125 MG per tablet   Likely viral URI - DDX includes influenza, but improving and afebrile in office.  Sx care discussed and out of work/contact precautions discussed. Hycodan at night if needed, and augmentin in 4-5 days if not improved sinus congestion/pressure and persistent purulent nasal discharge. rtc precautions. Note for work given.   Patient Instructions  Saline nasal spray atleast 4 times per day, over the counter mucinex or mucinex DM for cough. Sudafed or afrin (3 days only) during work if needed for congestion. If not  improving in next 4-5 days - can start antibiotic. Ok to return to work if no fever for 24 hours (off fever reducing medication). Return to the clinic or go to the nearest emergency room if any of your symptoms worsen or new symptoms occur.

## 2012-02-06 NOTE — Patient Instructions (Addendum)
Saline nasal spray atleast 4 times per day, over the counter mucinex or mucinex DM for cough. Sudafed or afrin (3 days only) during work if needed for congestion. If not improving in next 4-5 days - can start antibiotic. Ok to return to work if no fever for 24 hours (off fever reducing medication). Return to the clinic or go to the nearest emergency room if any of your symptoms worsen or new symptoms occur.

## 2012-07-27 ENCOUNTER — Other Ambulatory Visit: Payer: Self-pay | Admitting: Family Medicine

## 2012-08-27 ENCOUNTER — Other Ambulatory Visit: Payer: Self-pay | Admitting: Physician Assistant

## 2013-04-16 ENCOUNTER — Other Ambulatory Visit: Payer: Self-pay | Admitting: Obstetrics & Gynecology

## 2013-04-20 ENCOUNTER — Encounter (HOSPITAL_COMMUNITY)
Admission: RE | Admit: 2013-04-20 | Discharge: 2013-04-20 | Disposition: A | Discharge status: Home / Self Care | Payer: BC Managed Care – PPO | Source: Ambulatory Visit | Attending: Obstetrics & Gynecology | Admitting: Obstetrics & Gynecology

## 2013-04-20 ENCOUNTER — Encounter (HOSPITAL_COMMUNITY): Payer: Self-pay

## 2013-04-20 DIAGNOSIS — Z01812 Encounter for preprocedural laboratory examination: Secondary | ICD-10-CM | POA: Insufficient documentation

## 2013-04-20 HISTORY — DX: Headache: R51

## 2013-04-20 LAB — CBC
HCT: 42 % (ref 36.0–46.0)
HEMOGLOBIN: 14.1 g/dL (ref 12.0–15.0)
MCH: 30.7 pg (ref 26.0–34.0)
MCHC: 33.6 g/dL (ref 30.0–36.0)
MCV: 91.5 fL (ref 78.0–100.0)
PLATELETS: 282 10*3/uL (ref 150–400)
RBC: 4.59 MIL/uL (ref 3.87–5.11)
RDW: 13.5 % (ref 11.5–15.5)
WBC: 6.1 10*3/uL (ref 4.0–10.5)

## 2013-04-20 NOTE — Patient Instructions (Addendum)
    Your procedure is scheduled on:  Tuesday, April 28  [facility] at: 6 AM Pick up the phone at the desk and dial [phone] and inform us of your arrival.  Please call this number if you have any problems the morning of surgery: [phone]  Remember: Do not eat or drink after midnight: Monday Take these medicines the morning of surgery with a SIP OF WATER:  None  Do not wear jewelry, make-up, or FINGER nail polish No metal in your hair or on your body. Do not wear lotions, powders, perfumes.  You may wear deodorant.  Do not bring valuables to the hospital. Contacts, dentures or bridgework may not be worn into surgery.  Leave suitcase in the car. After Surgery it may be brought to your room. For patients being admitted to the hospital, checkout time is 11:00am the day of discharge.    Patients discharged on the day of surgery will not be allowed to drive home.  Home with mother Krista Lee.

## 2013-04-28 ENCOUNTER — Encounter (HOSPITAL_COMMUNITY): Payer: Self-pay | Admitting: *Deleted

## 2013-04-28 ENCOUNTER — Encounter (HOSPITAL_COMMUNITY)
Admission: RE | Disposition: A | Discharge status: Home / Self Care | Payer: Self-pay | Source: Ambulatory Visit | Attending: Obstetrics & Gynecology

## 2013-04-28 ENCOUNTER — Ambulatory Visit (HOSPITAL_COMMUNITY)
Admission: RE | Admit: 2013-04-28 | Discharge: 2013-04-28 | Disposition: A | Discharge status: Home / Self Care | Payer: BC Managed Care – PPO | Source: Ambulatory Visit | Attending: Obstetrics & Gynecology | Admitting: Obstetrics & Gynecology

## 2013-04-28 ENCOUNTER — Encounter (HOSPITAL_COMMUNITY): Payer: BC Managed Care – PPO | Admitting: Anesthesiology

## 2013-04-28 ENCOUNTER — Ambulatory Visit (HOSPITAL_COMMUNITY): Payer: BC Managed Care – PPO | Admitting: Anesthesiology

## 2013-04-28 DIAGNOSIS — N949 Unspecified condition associated with female genital organs and menstrual cycle: Secondary | ICD-10-CM | POA: Insufficient documentation

## 2013-04-28 DIAGNOSIS — F411 Generalized anxiety disorder: Secondary | ICD-10-CM | POA: Insufficient documentation

## 2013-04-28 DIAGNOSIS — F329 Major depressive disorder, single episode, unspecified: Secondary | ICD-10-CM | POA: Insufficient documentation

## 2013-04-28 DIAGNOSIS — N946 Dysmenorrhea, unspecified: Secondary | ICD-10-CM | POA: Insufficient documentation

## 2013-04-28 DIAGNOSIS — Q5128 Other doubling of uterus, other specified: Secondary | ICD-10-CM | POA: Insufficient documentation

## 2013-04-28 DIAGNOSIS — F3289 Other specified depressive episodes: Secondary | ICD-10-CM | POA: Insufficient documentation

## 2013-04-28 DIAGNOSIS — F17200 Nicotine dependence, unspecified, uncomplicated: Secondary | ICD-10-CM | POA: Insufficient documentation

## 2013-04-28 DIAGNOSIS — Q513 Bicornate uterus: Secondary | ICD-10-CM | POA: Insufficient documentation

## 2013-04-28 DIAGNOSIS — K219 Gastro-esophageal reflux disease without esophagitis: Secondary | ICD-10-CM | POA: Insufficient documentation

## 2013-04-28 DIAGNOSIS — Q512 Other doubling of uterus, unspecified: Secondary | ICD-10-CM

## 2013-04-28 HISTORY — PX: HYSTEROSCOPY WITH RESECTOSCOPE: SHX5395

## 2013-04-28 HISTORY — PX: LAPAROSCOPY: SHX197

## 2013-04-28 LAB — HCG, SERUM, QUALITATIVE: PREG SERUM: NEGATIVE

## 2013-04-28 SURGERY — LAPAROSCOPY, DIAGNOSTIC
Anesthesia: General | Site: Vagina

## 2013-04-28 MED ORDER — HYDROMORPHONE HCL PF 1 MG/ML IJ SOLN
INTRAMUSCULAR | Status: AC
  Filled 2013-04-28: qty 1

## 2013-04-28 MED ORDER — LIDOCAINE HCL (CARDIAC) 20 MG/ML IV SOLN
INTRAVENOUS | Status: AC
  Filled 2013-04-28: qty 5

## 2013-04-28 MED ORDER — MIDAZOLAM HCL 2 MG/2ML IJ SOLN
INTRAMUSCULAR | Status: AC
  Filled 2013-04-28: qty 2

## 2013-04-28 MED ORDER — ROCURONIUM BROMIDE 100 MG/10ML IV SOLN
INTRAVENOUS | Status: DC | PRN
  Administered 2013-04-28: 50 mg via INTRAVENOUS

## 2013-04-28 MED ORDER — FENTANYL CITRATE 0.05 MG/ML IJ SOLN
INTRAMUSCULAR | Status: AC
  Filled 2013-04-28: qty 5

## 2013-04-28 MED ORDER — CEFAZOLIN SODIUM-DEXTROSE 2-3 GM-% IV SOLR
2.0000 g | INTRAVENOUS | Status: AC
  Administered 2013-04-28: 2 g via INTRAVENOUS

## 2013-04-28 MED ORDER — HYDROMORPHONE HCL PF 1 MG/ML IJ SOLN
INTRAMUSCULAR | Status: DC | PRN
  Administered 2013-04-28: 1 mg via INTRAVENOUS

## 2013-04-28 MED ORDER — BUPIVACAINE HCL (PF) 0.25 % IJ SOLN
INTRAMUSCULAR | Status: AC
  Filled 2013-04-28: qty 30

## 2013-04-28 MED ORDER — GLYCOPYRROLATE 0.2 MG/ML IJ SOLN
INTRAMUSCULAR | Status: AC
  Filled 2013-04-28: qty 3

## 2013-04-28 MED ORDER — NEOSTIGMINE METHYLSULFATE 1 MG/ML IJ SOLN
INTRAMUSCULAR | Status: AC
  Filled 2013-04-28: qty 1

## 2013-04-28 MED ORDER — MEPERIDINE HCL 25 MG/ML IJ SOLN: 6.2500 mg | INTRAMUSCULAR | Status: DC | PRN

## 2013-04-28 MED ORDER — MIDAZOLAM HCL 2 MG/2ML IJ SOLN
INTRAMUSCULAR | Status: DC | PRN
  Administered 2013-04-28: 2 mg via INTRAVENOUS

## 2013-04-28 MED ORDER — FENTANYL CITRATE 0.05 MG/ML IJ SOLN: 25.0000 ug | INTRAMUSCULAR | Status: DC | PRN

## 2013-04-28 MED ORDER — ACETAMINOPHEN 10 MG/ML IV SOLN
1000.0000 mg | Freq: Once | INTRAVENOUS | Status: AC
  Administered 2013-04-28: 1000 mg via INTRAVENOUS
  Filled 2013-04-28: qty 100

## 2013-04-28 MED ORDER — TRAMADOL HCL 50 MG PO TABS: 50.0000 mg | ORAL_TABLET | Freq: Four times a day (QID) | ORAL | Status: DC | PRN

## 2013-04-28 MED ORDER — ROCURONIUM BROMIDE 100 MG/10ML IV SOLN
INTRAVENOUS | Status: AC
  Filled 2013-04-28: qty 1

## 2013-04-28 MED ORDER — PROPOFOL 10 MG/ML IV BOLUS
INTRAVENOUS | Status: DC | PRN
  Administered 2013-04-28: 200 mg via INTRAVENOUS

## 2013-04-28 MED ORDER — ARTIFICIAL TEARS OP OINT
TOPICAL_OINTMENT | OPHTHALMIC | Status: DC | PRN
  Administered 2013-04-28: 1 via OPHTHALMIC

## 2013-04-28 MED ORDER — DEXAMETHASONE SODIUM PHOSPHATE 10 MG/ML IJ SOLN
INTRAMUSCULAR | Status: DC | PRN
  Administered 2013-04-28: 8 mg via INTRAVENOUS

## 2013-04-28 MED ORDER — SODIUM CHLORIDE BACTERIOSTATIC 0.9 % IJ SOLN
INTRAMUSCULAR | Status: DC | PRN
  Administered 2013-04-28: 3000 mL via VAGINAL

## 2013-04-28 MED ORDER — LIDOCAINE HCL (CARDIAC) 20 MG/ML IV SOLN
INTRAVENOUS | Status: DC | PRN
  Administered 2013-04-28: 60 mg via INTRAVENOUS

## 2013-04-28 MED ORDER — ONDANSETRON HCL 4 MG/2ML IJ SOLN
INTRAMUSCULAR | Status: DC | PRN
  Administered 2013-04-28: 4 mg via INTRAVENOUS

## 2013-04-28 MED ORDER — DEXAMETHASONE SODIUM PHOSPHATE 10 MG/ML IJ SOLN
INTRAMUSCULAR | Status: AC
  Filled 2013-04-28: qty 1

## 2013-04-28 MED ORDER — METOCLOPRAMIDE HCL 5 MG/ML IJ SOLN
INTRAMUSCULAR | Status: AC
  Administered 2013-04-28: 10 mg via INTRAVENOUS
  Filled 2013-04-28: qty 2

## 2013-04-28 MED ORDER — NEOSTIGMINE METHYLSULFATE 1 MG/ML IJ SOLN
INTRAMUSCULAR | Status: DC | PRN
  Administered 2013-04-28: 3 mg via INTRAVENOUS

## 2013-04-28 MED ORDER — ONDANSETRON HCL 4 MG/2ML IJ SOLN
INTRAMUSCULAR | Status: AC
  Filled 2013-04-28: qty 2

## 2013-04-28 MED ORDER — ONDANSETRON HCL 4 MG/2ML IJ SOLN: 4.0000 mg | Freq: Once | INTRAMUSCULAR | Status: DC | PRN

## 2013-04-28 MED ORDER — PROPOFOL 10 MG/ML IV EMUL
INTRAVENOUS | Status: AC
  Filled 2013-04-28: qty 20

## 2013-04-28 MED ORDER — LACTATED RINGERS IR SOLN
Status: DC | PRN
  Administered 2013-04-28: 3000 mL

## 2013-04-28 MED ORDER — CEFAZOLIN SODIUM-DEXTROSE 2-3 GM-% IV SOLR
INTRAVENOUS | Status: AC
  Filled 2013-04-28: qty 50

## 2013-04-28 MED ORDER — CHLOROPROCAINE HCL 1 % IJ SOLN
INTRAMUSCULAR | Status: AC
  Filled 2013-04-28: qty 30

## 2013-04-28 MED ORDER — GLYCOPYRROLATE 0.2 MG/ML IJ SOLN
INTRAMUSCULAR | Status: DC | PRN
  Administered 2013-04-28: .4 mg via INTRAVENOUS
  Administered 2013-04-28: .2 mg via INTRAVENOUS

## 2013-04-28 MED ORDER — LACTATED RINGERS IV SOLN
INTRAVENOUS | Status: DC
  Administered 2013-04-28 (×3): via INTRAVENOUS

## 2013-04-28 MED ORDER — METOCLOPRAMIDE HCL 5 MG/ML IJ SOLN
10.0000 mg | Freq: Once | INTRAMUSCULAR | Status: AC
  Administered 2013-04-28: 10 mg via INTRAVENOUS

## 2013-04-28 MED ORDER — FENTANYL CITRATE 0.05 MG/ML IJ SOLN
INTRAMUSCULAR | Status: DC | PRN
  Administered 2013-04-28: 100 ug via INTRAVENOUS
  Administered 2013-04-28: 50 ug via INTRAVENOUS
  Administered 2013-04-28: 100 ug via INTRAVENOUS

## 2013-04-28 MED ORDER — BUPIVACAINE HCL (PF) 0.25 % IJ SOLN
INTRAMUSCULAR | Status: DC | PRN
  Administered 2013-04-28: 20 mL

## 2013-04-28 SURGICAL SUPPLY — 88 items
ABLATOR ENDOMETRIAL BIPOLAR (ABLATOR) IMPLANT
ADH SKN CLS APL DERMABOND .7 (GAUZE/BANDAGES/DRESSINGS) ×3
APPLICATOR COTTON TIP 6IN STRL (MISCELLANEOUS) ×4 IMPLANT
BARRIER ADHS 3X4 INTERCEED (GAUZE/BANDAGES/DRESSINGS) ×4 IMPLANT
BRR ADH 4X3 ABS CNTRL BYND (GAUZE/BANDAGES/DRESSINGS) ×3
CABLE HIGH FREQUENCY MONO STRZ (ELECTRODE) ×2 IMPLANT
CANISTER SUCT 3000ML (MISCELLANEOUS) ×4 IMPLANT
CATH ROBINSON RED A/P 16FR (CATHETERS) ×4 IMPLANT
CHLORAPREP W/TINT 26ML (MISCELLANEOUS) ×4 IMPLANT
CLOTH BEACON ORANGE TIMEOUT ST (SAFETY) ×4 IMPLANT
CONT PATH 16OZ SNAP LID 3702 (MISCELLANEOUS) ×4 IMPLANT
CONTAINER PREFILL 10% NBF 60ML (FORM) ×8 IMPLANT
COVER MAYO STAND STRL (DRAPES) ×4 IMPLANT
COVER TABLE BACK 60X90 (DRAPES) ×8 IMPLANT
COVER TIP SHEARS 8 DVNC (MISCELLANEOUS) ×3 IMPLANT
COVER TIP SHEARS 8MM DA VINCI (MISCELLANEOUS) ×1
DECANTER SPIKE VIAL GLASS SM (MISCELLANEOUS) ×4 IMPLANT
DERMABOND ADVANCED (GAUZE/BANDAGES/DRESSINGS) ×1
DERMABOND ADVANCED .7 DNX12 (GAUZE/BANDAGES/DRESSINGS) ×5 IMPLANT
DRAPE HUG U DISPOSABLE (DRAPE) ×4 IMPLANT
DRAPE HYSTEROSCOPY (DRAPE) ×4 IMPLANT
DRAPE LG THREE QUARTER DISP (DRAPES) ×8 IMPLANT
DRAPE WARM FLUID 44X44 (DRAPE) ×4 IMPLANT
DRSG TELFA 3X8 NADH (GAUZE/BANDAGES/DRESSINGS) ×4 IMPLANT
ELECT REM PT RETURN 9FT ADLT (ELECTROSURGICAL) ×4
ELECTRODE REM PT RTRN 9FT ADLT (ELECTROSURGICAL) ×3 IMPLANT
EVACUATOR SMOKE 8.L (FILTER) ×4 IMPLANT
GAUZE VASELINE 3X9 (GAUZE/BANDAGES/DRESSINGS) IMPLANT
GLOVE BIO SURGEON STRL SZ 6.5 (GLOVE) ×4 IMPLANT
GLOVE BIOGEL PI IND STRL 7.0 (GLOVE) ×3 IMPLANT
GLOVE BIOGEL PI INDICATOR 7.0 (GLOVE) ×1
GOWN STRL REUS W/TWL LRG LVL3 (GOWN DISPOSABLE) ×24 IMPLANT
IV STOPCOCK 4 WAY 40  W/Y SET (IV SOLUTION) ×1
IV STOPCOCK 4 WAY 40 W/Y SET (IV SOLUTION) ×3 IMPLANT
KIT ACCESSORY DA VINCI DISP (KITS) ×1
KIT ACCESSORY DVNC DISP (KITS) ×3 IMPLANT
LEGGING LITHOTOMY PAIR STRL (DRAPES) ×4 IMPLANT
LOOP ANGLED CUTTING 22FR (CUTTING LOOP) IMPLANT
MANIPULATOR UTERINE 4.5 ZUMI (MISCELLANEOUS) IMPLANT
NDL SPNL 22GX3.5 QUINCKE BK (NEEDLE) ×2 IMPLANT
NEEDLE HYPO 22GX1.5 SAFETY (NEEDLE) IMPLANT
NEEDLE SPNL 22GX3.5 QUINCKE BK (NEEDLE) ×4 IMPLANT
OCCLUDER COLPOPNEUMO (BALLOONS) ×2 IMPLANT
PACK LAPAROSCOPY BASIN (CUSTOM PROCEDURE TRAY) ×4 IMPLANT
PACK LAVH (CUSTOM PROCEDURE TRAY) ×4 IMPLANT
PACK VAGINAL MINOR WOMEN LF (CUSTOM PROCEDURE TRAY) ×4 IMPLANT
PAD DRESSING TELFA 3X8 NADH (GAUZE/BANDAGES/DRESSINGS) ×2 IMPLANT
PAD OB MATERNITY 4.3X12.25 (PERSONAL CARE ITEMS) ×4 IMPLANT
PAD PREP 24X48 CUFFED NSTRL (MISCELLANEOUS) ×8 IMPLANT
PENCIL BUTTON HOLSTER BLD 10FT (ELECTRODE) ×4 IMPLANT
PROTECTOR NERVE ULNAR (MISCELLANEOUS) ×8 IMPLANT
SET CYSTO W/LG BORE CLAMP LF (SET/KITS/TRAYS/PACK) IMPLANT
SET IRRIG TUBING LAPAROSCOPIC (IRRIGATION / IRRIGATOR) ×8 IMPLANT
SET TUBING HYSTEROSCOPY 2 NDL (TUBING) ×2 IMPLANT
SOLUTION ELECTROLUBE (MISCELLANEOUS) ×4 IMPLANT
SUT MNCRL AB 4-0 PS2 18 (SUTURE) ×8 IMPLANT
SUT VIC AB 0 CT1 27 (SUTURE)
SUT VIC AB 0 CT1 27XBRD ANTBC (SUTURE) IMPLANT
SUT VIC AB 0 CT2 27 (SUTURE) IMPLANT
SUT VIC AB 2-0 CT1 27 (SUTURE)
SUT VIC AB 2-0 CT1 TAPERPNT 27 (SUTURE) IMPLANT
SUT VIC AB 3-0 SH 27 (SUTURE)
SUT VIC AB 3-0 SH 27X BRD (SUTURE) IMPLANT
SUT VIC AB 4-0 PS2 27 (SUTURE) ×8 IMPLANT
SUT VICRYL 0 UR6 27IN ABS (SUTURE) ×8 IMPLANT
SYR 50ML LL SCALE MARK (SYRINGE) ×4 IMPLANT
SYR CONTROL 10ML LL (SYRINGE) ×4 IMPLANT
SYSTEM CONVERTIBLE TROCAR (TROCAR) ×2 IMPLANT
TIP UTERINE 5.1X6CM LAV DISP (MISCELLANEOUS) ×2 IMPLANT
TIP UTERINE 6.7X10CM GRN DISP (MISCELLANEOUS) IMPLANT
TIP UTERINE 6.7X6CM WHT DISP (MISCELLANEOUS) IMPLANT
TIP UTERINE 6.7X8CM BLUE DISP (MISCELLANEOUS) IMPLANT
TOWEL OR 17X24 6PK STRL BLUE (TOWEL DISPOSABLE) ×12 IMPLANT
TRAY FOLEY BAG SILVER LF 14FR (CATHETERS) ×4 IMPLANT
TRAY FOLEY CATH 14FR (SET/KITS/TRAYS/PACK) ×4 IMPLANT
TROCAR 12M 150ML BLUNT (TROCAR) ×2 IMPLANT
TROCAR BALLN 12MMX100 BLUNT (TROCAR) ×4 IMPLANT
TROCAR DISP BLADELESS 8 DVNC (TROCAR) ×3 IMPLANT
TROCAR DISP BLADELESS 8MM (TROCAR) ×1
TROCAR OPTI TIP 12M 100M (ENDOMECHANICALS) IMPLANT
TROCAR XCEL 12X100 BLDLESS (ENDOMECHANICALS) ×4 IMPLANT
TROCAR XCEL NON-BLD 11X100MML (ENDOMECHANICALS) ×2 IMPLANT
TROCAR XCEL NON-BLD 5MMX100MML (ENDOMECHANICALS) ×4 IMPLANT
TROCAR XCEL OPT SLVE 5M 100M (ENDOMECHANICALS) IMPLANT
TUBE HYSTEROSCOPY W Y-CONNECT (TUBING) ×2 IMPLANT
TUBING FILTER THERMOFLATOR (ELECTROSURGICAL) ×8 IMPLANT
WARMER LAPAROSCOPE (MISCELLANEOUS) ×4 IMPLANT
WATER STERILE IRR 1000ML POUR (IV SOLUTION) ×12 IMPLANT

## 2013-04-28 NOTE — Anesthesia Preprocedure Evaluation (Signed)
Anesthesia Evaluation  Patient identified by MRN, date of birth, ID band Patient awake    Reviewed: Allergy & Precautions, H&P , NPO status , Patient's Chart, lab work & pertinent test results  Airway Mallampati: I TM Distance: >3 FB Neck ROM: full    Dental no notable dental hx. (+) Teeth Intact   Pulmonary Current Smoker,    Pulmonary exam normal       Cardiovascular negative cardio ROS      Neuro/Psych    GI/Hepatic Neg liver ROS,   Endo/Other  negative endocrine ROS  Renal/GU negative Renal ROS     Musculoskeletal   Abdominal Normal abdominal exam  (+)   Peds  Hematology   Anesthesia Other Findings   Reproductive/Obstetrics negative OB ROS                           Anesthesia Physical Anesthesia Plan  ASA: II  Anesthesia Plan: General   Post-op Pain Management:    Induction: Intravenous  Airway Management Planned: Oral ETT  Additional Equipment:   Intra-op Plan:   Post-operative Plan: Extubation in OR  Informed Consent: I have reviewed the patients History and Physical, chart, labs and discussed the procedure including the risks, benefits and alternatives for the proposed anesthesia with the patient or authorized representative who has indicated his/her understanding and acceptance.   Dental Advisory Given  Plan Discussed with: CRNA and Surgeon  Anesthesia Plan Comments:         Anesthesia Quick Evaluation

## 2013-04-28 NOTE — Discharge Summary (Signed)
  Physician Discharge Summary  Patient ID: Krista MakiDiana K Gunnerson MRN: [id number] DOB/AGE: 1985/04/24 27 y.o.  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 *   Discharged Condition: good  Hospital Course: Outpatient  Consults: None  Treatments: surgery: Diagnostic laparoscopy and hysteroscopy  Disposition: 01-Home or Self Care     Medication List         acetaminophen 500 MG tablet  Commonly known as:  TYLENOL  Take 1,000 mg by mouth every 6 (six) hours as needed for moderate pain.     ondansetron 8 MG disintegrating tablet  Commonly known as:  ZOFRAN-ODT  Take 8 mg by mouth every 8 (eight) hours as needed for nausea or vomiting.     traMADol 50 MG tablet  Commonly known as:  ULTRAM  Take 1 tablet (50 mg total) by mouth every 6 (six) hours as needed.     trolamine salicylate 10 % cream  Commonly known as:  ASPERCREME  Apply 1 application topically as needed for muscle pain.           Follow-up Information   Follow up with Deette Revak,MARIE-LYNE, MD In 3 weeks.   Specialty:  Obstetrics and Gynecology   Contact information:   [address] [place] [id number] [phone]       Signed: Genia DelMarie-Lyne Taiden Raybourn, MD 04/28/2013, 9:23 AM

## 2013-04-28 NOTE — Anesthesia Postprocedure Evaluation (Signed)
Anesthesia Post Note  Patient: Krista Lee  Procedure(s) Performed: Procedure(s) (LRB): LAPAROSCOPY DIAGNOSTIC (N/A)  HYSTEROSCOPY  (N/A)  Anesthesia type: General  Patient location: PACU  Post pain: Pain level controlled  Post assessment: Post-op Vital signs reviewed  Last Vitals:  Filed Vitals:   04/28/13 0945  BP: 110/78  Pulse: 52  Temp:   Resp: 12    Post vital signs: Reviewed  Level of consciousness: sedated  Complications: No apparent anesthesia complications

## 2013-04-28 NOTE — H&P (Signed)
Krista Lee is an 28 y.o. 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  Possible uterine septum for Dx HSC.  Pertinent Gynecological History: Menses: flow is moderate qmth Contraception: none Blood transfusions: none Sexually transmitted diseases: no past history Previous GYN Procedures: None  Last mammogram: None Last pap: normal OB History: G0   Menstrual History:  No LMP recorded. Patient is not currently having periods (Reason: Other).    Past Medical History  Diagnosis Date  . ADD (attention deficit disorder)   . Allergy     no meds  . Insomnia     no meds  . History of bronchitis     as a teenager, no problems as adult  . History of migraine     can't remember when the last one was  . Anemia     as adolescent  . Anxiety   . Panic attacks   . Depression   . GERD (gastroesophageal reflux disease)     doesn't take any meds for this, diet controlled  . Heart murmur     as a child - no problems  . ZOXWRUEA(540.9Headache(784.0)     Past Surgical History  Procedure Laterality Date  . Wisdom tooth extraction  2254yrs ago  . Cholecystectomy  10/22/2011    Procedure: LAPAROSCOPIC CHOLECYSTECTOMY;  Surgeon: Shelly Rubensteinouglas A Blackman, MD;  Location: [facility] OR;  Service: General;  Laterality: N/A;    Family History  Problem Relation Age of Onset  . Hypertension Mother   . Diverticulitis Mother   . Hyperlipidemia Mother   . Arthritis Father     Social History:  reports that she has been smoking Cigarettes.  She has a 4 pack-year smoking history. She quit smokeless tobacco use about 2 years ago. She reports that she drinks about 7 ounces of alcohol per week. She reports that she does not use illicit drugs.  Allergies:  Allergies  Allergen Reactions  . Ibuprofen Other (See Comments)    Childhood reaction of high fever  . Percocet [Oxycodone-Acetaminophen] Nausea Only    Pt can  take regular tylenol but in combination with codeine products is makes her very sick to her stomach.    Prescriptions prior to admission  Medication Sig Dispense Refill  . acetaminophen (TYLENOL) 500 MG tablet Take 1,000 mg by mouth every 6 (six) hours as needed for moderate pain.      Marland Kitchen. ondansetron (ZOFRAN-ODT) 8 MG disintegrating tablet Take 8 mg by mouth every 8 (eight) hours as needed for nausea or vomiting.      . trolamine salicylate (ASPERCREME) 10 % cream Apply 1 application topically as needed for muscle pain.        ROS  Blood pressure 109/74, pulse 88, temperature 97.7 F (36.5 C), temperature source Oral, resp. rate 20, SpO2 100.00%. Physical Exam  No results found for this or any previous visit (from the past 24 hour(s)).  No results found.  Assessment/Plan: Pelvic pain and dysmenorrhea for Dx LPS possible treatment of endometriosis/lysis of adhesions.  Possible uterine septum for Dx HSC.  Surgery and risks reviewed.  Krista Lee 04/28/2013, 6:52 AM

## 2013-04-28 NOTE — Op Note (Signed)
04/28/2013  8:55 AM  PATIENT:  Krista Lee  28 y.o. 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 Ernestina PennaFogleman, MD  ASSISTANTS: Krista SiasKelly A. Fogleman MD   ANESTHESIA:   general  PROCEDURE:  Under general anesthesia with endotracheal intubation the patient is in lithotomy position. She is prepped with ChloraPrep on the abdomen and with Betadine on the suprapubic, vulvar and vaginal areas.  She is draped as usual. The Foley is inserted in the bladder. The vaginal exam reveals a retroverted uterus normal volume no adnexal mass.  The weighted speculum is inserted in the vagina the anterior lip of the cervix is grasped with a tenaculum and on the hysterometry is at 8 cm. A thin #6 Roomy with a small Ko-ring are inserted without difficulty. The tenaculum was removed as well as the weighted speculum.  Abdominally, we infiltrate the infraumbilical area with Marcaine one quarter plain. We make an incision at the site of the previous incision (previous cholecystectomy) with the scalpel.  The aponeurosis is opened with Mayo scissors under direct vision.  The peritoneum is opened under direct vision with Mayo scissors as well. A pursestring stitch of Vicryl 0 is done on the aponeurosis. The Krista Lee is inserted at that level with the camera.  We make a 5 mm incision after subcutaneous infiltration of Marcaine one quarter plain at the left lower abdomen.  We insert the 5 mm ports at that level.  Inspection of the abdomen reveal the absence of the gallbladder. Normal liver. No adhesion.  The appendix is normal to inspection.  The pelvis shows a normal shape of the uterus, in particular the fundus does not show any heart-shaped anatomy.  Both tubes are normal to inspection including the fimbria on both  sides.  Both ovaries are normal in size and appearance. No lesion of endometriosis or adhesions are present in the pelvis.  Pictures are taken of all the above-mentioned anatomy.  Hemostasis is adequate.  We removed laparoscopy instruments.  The ports were removed.  The CO2 is evacuated.  We attached the pursestring stitch at the infraumbilical incision.  A subcuticular stitch of the on Monocryl 3-0 was used at both incisions. We added Dermabond.  We then went vaginally for the diagnostic hysteroscopy.  The Ko-ring and the roomy were removed. A speculum was inserted in the vagina. The anterior lip of the cervix was grasped with a tenaculum.  Dilation of the cervix with Hegar dilators up to #21.  The diagnostic hysteroscope was inserted easily in the intrauterine cavity.  Both ostia were well seen.  No lesion was present in the intrauterine cavity.  In particular no septum was seen.  Pictures were taken of all the above mentioned anatomy.  The hysteroscope was removed. The tenaculum was removed from the cervix. Silver nitrate was used to complete hemostasis at that level. The speculum was removed.  The patient was brought to recovery room in good and stable status.  ESTIMATED BLOOD LOSS: 10 cc FLUID DEFICIT 150 cc   Intake/Output Summary (Last 24 hours) at 04/28/13 0855 Last data filed at 04/28/13 0854  Gross per 24 hour  Intake   1800 ml  Output    210 ml  Net   1590 ml     BLOOD ADMINISTERED:none   LOCAL MEDICATIONS USED:  MARCAINE  SPECIMEN:  No Specimen  DISPOSITION OF SPECIMEN:  N/A  COUNTS:  YES  PLAN OF CARE: Transfer to PACU  Krista DelMarie-Lyne Krista Greenfield MD  04/28/2013 at 8:55 am

## 2013-04-28 NOTE — Transfer of Care (Signed)
Immediate Anesthesia Transfer of Care Note  Patient: Krista Lee  Procedure(s) Performed: Procedure(s) with comments: LAPAROSCOPY DIAGNOSTIC (N/A) - 2 hrs. total  HYSTEROSCOPY  (N/A)  Patient Location: PACU  Anesthesia Type:General  Level of Consciousness: awake, alert  and oriented  Airway & Oxygen Therapy: Patient Spontanous Breathing and Patient connected to nasal cannula oxygen  Post-op Assessment: Report given to PACU RN and Post -op Vital signs reviewed and stable  Post vital signs: Reviewed and stable  Complications: No apparent anesthesia complications

## 2013-04-28 NOTE — Discharge Instructions (Signed)
Diagnostic Laparoscopy  Laparoscopy is a surgical procedure. It is used to diagnose and treat diseases inside the belly (abdomen). It is usually a brief, common, and relatively simple procedure. The laparoscopeis a thin, lighted, pencil-sized instrument. It is like a telescope. It is inserted into your abdomen through a small cut (incision). Your caregiver can look at the organs inside your body through this instrument. He or she can see if there is anything abnormal.  Laparoscopy can be done either in a hospital or outpatient clinic. You may be given a mild sedative to help you relax before the procedure. Once in the operating room, you will be given a drug to make you sleep (general anesthesia). Laparoscopy usually lasts less than 1 hour. After the procedure, you will be monitored in a recovery area until you are stable and doing well. Once you are home, it will take 2 to 3 days to fully recover.  RISKS AND COMPLICATIONS   Laparoscopy has relatively few risks. Your caregiver will discuss the risks with you before the procedure.  Some problems that can occur include:  · Infection.  · Bleeding.  · Damage to other organs.  · Anesthetic side effects.  PROCEDURE  Once you receive anesthesia, your surgeon inflates the abdomen with a harmless gas (carbon dioxide). This makes the organs easier to see. The laparoscope is inserted into the abdomen through a small incision. This allows your surgeon to see into the abdomen. Other small instruments are also inserted into the abdomen through other small openings. Many surgeons attach a video camera to the laparoscope to enlarge the view.  During a diagnostic laparoscopy, the surgeon may be looking for inflammation, infection, or cancer. Your surgeon may take tissue samples(biopsies). The samples are sent to a specialist in looking at cells and tissue samples (pathologist). The pathologist examines them under a microscope. Biopsies can help to diagnose or confirm a  disease.  AFTER THE PROCEDURE   · The gas is released from inside the abdomen.  · The incisions are closed with stitches (sutures). Because these incisions are small (usually less than 1/2 inch), there is usually minimal discomfort after the procedure. There may be some mild discomfort in the throat. This is from the tube placed in the throat while you were sleeping. You may have some mild abdominal discomfort. There may also be discomfort from the instrument placement incisions in the abdomen.  · The recovery time is shortened as long as there are no complications.  · You will rest in a recovery room until stable and doing well. As long as there are no complications, you may be allowed to go home.  FINDING OUT THE RESULTS OF YOUR TEST  Not all test results are available during your visit. If your test results are not back during the visit, make an appointment with your caregiver to find out the results. Do not assume everything is normal if you have not heard from your caregiver or the medical facility. It is important for you to follow up on all of your test results.  HOME CARE INSTRUCTIONS   · Take all medicines as directed.  · Only take over-the-counter or prescription medicines for pain, discomfort, or fever as directed by your caregiver.  · Resume daily activities as directed.  · Showers are preferred over baths.  · You may resume sexual activities in 1 week or as directed.  · Do not drive while taking narcotics.  SEEK MEDICAL CARE IF:   · There is   increasing abdominal pain.  · There is new pain in the shoulders (shoulder strap areas).  · You feel lightheaded or faint.  · You have the chills.  · You or your child has an oral temperature above 102° F (38.9° C).  · There is pus-like (purulent) drainage from any of the wounds.  · You are unable to pass gas or have a bowel movement.  · You feel sick to your stomach (nauseous) or throw up (vomit).  MAKE SURE YOU:   · Understand these instructions.  · Will watch  your condition.  · Will get help right away if you are not doing well or get worse.  Document Released: 03/26/2000 Document Revised: 04/14/2012 Document Reviewed: 12/18/2006  ExitCare® Patient Information ©2014 [employer].

## 2013-04-29 ENCOUNTER — Encounter (HOSPITAL_COMMUNITY): Payer: Self-pay | Admitting: Obstetrics & Gynecology

## 2013-07-27 ENCOUNTER — Other Ambulatory Visit: Payer: Self-pay

## 2013-09-10 ENCOUNTER — Other Ambulatory Visit: Payer: Self-pay

## 2013-12-07 ENCOUNTER — Encounter (HOSPITAL_COMMUNITY): Payer: Self-pay | Admitting: Obstetrics & Gynecology

## 2018-11-10 ENCOUNTER — Emergency Department (INDEPENDENT_AMBULATORY_CARE_PROVIDER_SITE_OTHER)
Admission: EM | Admit: 2018-11-10 | Discharge: 2018-11-10 | Disposition: A | Discharge status: Home / Self Care | Payer: BLUE CROSS/BLUE SHIELD | Source: Home / Self Care

## 2018-11-10 ENCOUNTER — Encounter: Payer: Self-pay | Admitting: Emergency Medicine

## 2018-11-10 ENCOUNTER — Other Ambulatory Visit: Payer: Self-pay

## 2018-11-10 DIAGNOSIS — N898 Other specified noninflammatory disorders of vagina: Secondary | ICD-10-CM

## 2018-11-10 DIAGNOSIS — N760 Acute vaginitis: Secondary | ICD-10-CM

## 2018-11-10 NOTE — ED Provider Notes (Signed)
[facility]    CSN: [id number] Arrival date & time: 11/10/18  1121      History   Chief Complaint Chief Complaint  Patient presents with  . Vaginal Itching    HPI OMESHA BOWERMAN is a 33 y.o. female.   HPI ADRIJANA HAROS is a 33 y.o. female presenting to UC with c/o vaginal itching and discharge that started last week.  She used OTC monistat for 3 days and believes symptoms are gone but wants to make sure since she still has mild irritation.  She has not had a yeast infection in the past. She uses a menstrual cup and believes that is what may have caused her symptoms last week. She started using pads for the end of her menses but still has some irritation.  She denies concern for STIs.  Denies urinary symptoms. Denies n/v/d. No fever or chills. No new soaps, lotions or medications.    Past Medical History:  Diagnosis Date  . ADD (attention deficit disorder)   . Allergy    no meds  . Anemia    as adolescent  . Anxiety   . Depression   . GERD (gastroesophageal reflux disease)    doesn't take any meds for this, diet controlled  . Headache(784.0)   . Heart murmur    as a child - no problems  . History of bronchitis    as a teenager, no problems as adult  . History of migraine    can't remember when the last one was  . Insomnia    no meds  . Panic attacks     Patient Active Problem List   Diagnosis Date Noted  . Symptomatic cholelithiasis 09/25/2011  . Biliary dyskinesia 09/25/2011  . Abdominal pain 08/24/2011    Past Surgical History:  Procedure Laterality Date  . CHOLECYSTECTOMY  10/22/2011   Procedure: LAPAROSCOPIC CHOLECYSTECTOMY;  Surgeon: Shelly Rubenstein, MD;  Location: [facility] OR;  Service: General;  Laterality: N/A;  . HYSTEROSCOPY WITH RESECTOSCOPE N/A 04/28/2013   Procedure:  HYSTEROSCOPY ;  Surgeon: Genia Del, MD;  Location: [facility] ORS;  Service: Gynecology;  Laterality: N/A;  . LAPAROSCOPY N/A 04/28/2013   Procedure: LAPAROSCOPY DIAGNOSTIC;   Surgeon: Genia Del, MD;  Location: [facility] ORS;  Service: Gynecology;  Laterality: N/A;  2 hrs. total  . WISDOM TOOTH EXTRACTION  38yrs ago    OB History   No obstetric history on file.      Home Medications    Prior to Admission medications   Medication Sig Start Date End Date Taking? Authorizing Provider  Turmeric (QC TUMERIC COMPLEX) 500 MG CAPS Take by mouth.   Yes [provider]  acetaminophen (TYLENOL) 500 MG tablet Take 1,000 mg by mouth every 6 (six) hours as needed for moderate pain.    [provider]    Family History Family History  Problem Relation Age of Onset  . Hypertension Mother   . Diverticulitis Mother   . Hyperlipidemia Mother   . Arthritis Father     Social History Social History   Tobacco Use  . Smoking status: Current Every Day Smoker    Packs/day: 0.50    Years: 8.00    Pack years: 4.00    Types: Cigarettes  . Smokeless tobacco: Former Neurosurgeon    Quit date: 01/02/2011  . Tobacco comment: has used e-cig in the past; counseled on smoking cessation, not currently using   Substance Use Topics  . Alcohol use: Yes  Alcohol/week: 14.0 standard drinks    Types: 14 Standard drinks or equivalent per week    Comment: 2-3 glasses wine nightly  . Drug use: No     Allergies   Ibuprofen and Percocet [oxycodone-acetaminophen]   Review of Systems Review of Systems  Gastrointestinal: Negative for abdominal pain, diarrhea, nausea and vomiting.  Genitourinary: Positive for vaginal discharge and vaginal pain (itching, mild burning). Negative for dysuria, flank pain, frequency, genital sores, menstrual problem, pelvic pain and vaginal bleeding.     Physical Exam Triage Vital Signs ED Triage Vitals  Enc Vitals Group     BP 11/10/18 1140 106/72     Pulse Rate 11/10/18 1140 84     Resp --      Temp 11/10/18 1140 98.5 F (36.9 C)     Temp Source 11/10/18 1140 Oral     SpO2 11/10/18 1140 100 %     Weight 11/10/18 1141 118 lb (53.5  kg)     Height 11/10/18 1141 5\' 6"  (1.676 m)     Head Circumference --      Peak Flow --      Pain Score 11/10/18 1141 0     Pain Loc --      Pain Edu? --      Excl. in [place]? --    No data found.  Updated Vital Signs BP 106/72 (BP Location: Right Arm)   Pulse 84   Temp 98.5 F (36.9 C) (Oral)   Ht 5\' 6"  (1.676 m)   Wt 118 lb (53.5 kg)   LMP 11/03/2018 (Approximate)   SpO2 100%   BMI 19.05 kg/m     Physical Exam Vitals signs and nursing note reviewed.  Constitutional:      Appearance: Normal appearance. She is well-developed.  HENT:     Head: Normocephalic and atraumatic.  Neck:     Musculoskeletal: Normal range of motion.  Cardiovascular:     Rate and Rhythm: Normal rate and regular rhythm.  Pulmonary:     Effort: Pulmonary effort is normal. No respiratory distress.     Breath sounds: Normal breath sounds.  Abdominal:     General: There is no distension.     Palpations: Abdomen is soft.     Tenderness: There is no abdominal tenderness.  Genitourinary:    Comments: Deferred. Pt provided vaginal self-swab Musculoskeletal: Normal range of motion.  Skin:    General: Skin is warm and dry.  Neurological:     Mental Status: She is alert and oriented to person, place, and time.  Psychiatric:        Behavior: Behavior normal.      UC Treatments / Results  Labs (all labs ordered are listed, but only abnormal results are displayed) Labs Reviewed  CERVICOVAGINAL ANCILLARY ONLY    EKG   Radiology No results found.  Procedures Procedures (including critical care time)  Medications Ordered in UC Medications - No data to display  Initial Impression / Assessment and Plan / UC Course  I have reviewed the triage vital signs and the nursing notes.  Pertinent labs & imaging results that were available during my care of the patient were reviewed by me and considered in my medical decision making (see chart for details).    Vaginal self swab sent to lab to check  for yeast and/or BV AVS provided  Final Clinical Impressions(s) / UC Diagnoses   Final diagnoses:  Vaginal itching  Vaginitis and vulvovaginitis   Discharge Instructions  None    ED Prescriptions    None     PDMP not reviewed this encounter.   Lurene Shadowhelps, Kadra Kohan O, PA-C 11/11/18 1055

## 2018-11-10 NOTE — ED Triage Notes (Signed)
Vagina itching and burning, white cottage cheese discharge used 3 days monistat, finished, thinks the problem is resolved, she is married and is not concerned about STD's.  Wants to make sure yeast is gone

## 2018-11-13 LAB — CERVICOVAGINAL ANCILLARY ONLY
Bacterial vaginitis: NEGATIVE
Candida vaginitis: NEGATIVE

## 2018-11-14 ENCOUNTER — Telehealth: Payer: Self-pay

## 2018-11-14 NOTE — Telephone Encounter (Signed)
Pt called to get wet prep results. Informed test was neg.

## 2018-12-29 ENCOUNTER — Ambulatory Visit: Payer: BLUE CROSS/BLUE SHIELD | Admitting: Advanced Practice Midwife

## 2019-01-12 ENCOUNTER — Ambulatory Visit (INDEPENDENT_AMBULATORY_CARE_PROVIDER_SITE_OTHER): Payer: 59 | Admitting: Advanced Practice Midwife

## 2019-01-12 ENCOUNTER — Encounter: Payer: Self-pay | Admitting: Advanced Practice Midwife

## 2019-01-12 ENCOUNTER — Other Ambulatory Visit: Payer: Self-pay

## 2019-01-12 VITALS — BP 111/74 | HR 75 | Ht 66.0 in | Wt 123.0 lb

## 2019-01-12 DIAGNOSIS — G8929 Other chronic pain: Secondary | ICD-10-CM

## 2019-01-12 DIAGNOSIS — Z3169 Encounter for other general counseling and advice on procreation: Secondary | ICD-10-CM

## 2019-01-12 DIAGNOSIS — Z01419 Encounter for gynecological examination (general) (routine) without abnormal findings: Secondary | ICD-10-CM

## 2019-01-12 DIAGNOSIS — R102 Pelvic and perineal pain: Secondary | ICD-10-CM

## 2019-01-12 DIAGNOSIS — Z124 Encounter for screening for malignant neoplasm of cervix: Secondary | ICD-10-CM

## 2019-01-12 DIAGNOSIS — Z1151 Encounter for screening for human papillomavirus (HPV): Secondary | ICD-10-CM | POA: Diagnosis not present

## 2019-01-12 NOTE — Patient Instructions (Signed)
Some natural remedies/prevention to try for bacterial vaginosis: --Take a probiotic tablet/capsule every day for at least 1-2 months.   --Whenever you have symptoms, use boric acid suppositories vaginally every night for a week.   --Do not use scented soaps/perfumes in the vaginal area, and do not overwash multiple times daily. --Wear breathable cotton underwear and do not wear tight restrictive clothing. --Limit pantyliner use, change your underwear several times daily instead. --Use condoms during intercourse.    Preparing for Pregnancy If you are considering becoming pregnant, make an appointment to see your regular health care provider to learn how to prepare for a safe and healthy pregnancy (preconception care). During a preconception care visit, your health care provider will:  Do a complete physical exam, including a Pap test.  Take a complete medical history.  Give you information, answer your questions, and help you resolve problems. Preconception checklist Medical history  Tell your health care provider about any current or past medical conditions. Your pregnancy or your ability to become pregnant may be affected by chronic conditions, such as diabetes, chronic hypertension, and thyroid problems.  Include your family's medical history as well as your partner's medical history.  Tell your health care provider about any history of STIs (sexually transmitted infections).These can affect your pregnancy. In some cases, they can be passed to your baby. Discuss any concerns that you have about STIs.  If indicated, discuss the benefits of genetic testing. This testing will show whether there are any genetic conditions that may be passed from you or your partner to your baby.  Tell your health care provider about: ? Any problems you have had with conception or pregnancy. ? Any medicines you take. These include vitamins, herbal supplements, and over-the-counter medicines. ? Your  history of immunizations. Discuss any vaccinations that you may need. Diet  Ask your health care provider what to include in a healthy diet that has a balance of nutrients. This is especially important when you are pregnant or preparing to become pregnant.  Ask your health care provider to help you reach a healthy weight before pregnancy. ? If you are overweight, you may be at higher risk for certain complications, such as high blood pressure, diabetes, and preterm birth. ? If you are underweight, you are more likely to have a baby who has a low birth weight. Lifestyle, work, and home  Let your health care provider know: ? About any lifestyle habits that you have, such as alcohol use, drug use, or smoking. ? About recreational activities that may put you at risk during pregnancy, such as downhill skiing and certain exercise programs. ? Tell your health care provider about any international travel, especially any travel to places with an active [place] virus outbreak. ? About harmful substances that you may be exposed to at work or at home. These include chemicals, pesticides, radiation, or even litter boxes. ? If you do not feel safe at home. Mental health  Tell your health care provider about: ? Any history of mental health conditions, including feelings of depression, sadness, or anxiety. ? Any medicines that you take for a mental health condition. These include herbs and supplements. Home instructions to prepare for pregnancy Lifestyle   Eat a balanced diet. This includes fresh fruits and vegetables, whole grains, lean meats, low-fat dairy products, healthy fats, and foods that are high in fiber. Ask to meet with a nutritionist or registered dietitian for assistance with meal planning and goals.  Get regular exercise. Try to  be active for at least 30 minutes a day on most days of the week. Ask your health care provider which activities are safe during pregnancy.  Do not use any products  that contain nicotine or tobacco, such as cigarettes and e-cigarettes. If you need help quitting, ask your health care provider.  Do not drink alcohol.  Do not take illegal drugs.  Maintain a healthy weight. Ask your health care provider what weight range is right for you. General instructions  Keep an accurate record of your menstrual periods. This makes it easier for your health care provider to determine your baby's due date.  Begin taking prenatal vitamins and folic acid supplements daily as directed by your health care provider.  Manage any chronic conditions, such as high blood pressure and diabetes, as told by your health care provider. This is important. How do I know that I am pregnant? You may be pregnant if you have been sexually active and you miss your period. Symptoms of early pregnancy include:  Mild cramping.  Very light vaginal bleeding (spotting).  Feeling unusually tired.  Nausea and vomiting (morning sickness). If you have any of these symptoms and you suspect that you might be pregnant, you can take a home pregnancy test. These tests check for a hormone in your urine (human chorionic gonadotropin, or hCG). A woman's body begins to make this hormone during early pregnancy. These tests are very accurate. Wait until at least the first day after you miss your period to take one. If the test shows that you are pregnant (you get a positive result), call your health care provider to make an appointment for prenatal care. What should I do if I become pregnant?      Make an appointment with your health care provider as soon as you suspect you are pregnant.  Do not use any products that contain nicotine, such as cigarettes, chewing tobacco, and e-cigarettes. If you need help quitting, ask your health care provider.  Do not drink alcoholic beverages. Alcohol is related to a number of birth defects.  Avoid toxic odors and chemicals.  You may continue to have sexual  intercourse if it does not cause pain or other problems, such as vaginal bleeding. This information is not intended to replace advice given to you by your health care provider. Make sure you discuss any questions you have with your health care provider. Document Revised: 12/20/2016 Document Reviewed: 07/10/2015 Elsevier Patient Education  2020 [employer].

## 2019-01-12 NOTE — Progress Notes (Signed)
Patient presents for Annual Exam today.  Pt wants to conceive.   Last pap: 02/2017 per pt done at [facility] [place].   Needs to sign ROI  From [facility]  OB/GYN. U/S and surgery notes.   CC: Pain w/ intercourse.   Pt consents to student in exam room

## 2019-01-12 NOTE — Progress Notes (Signed)
History:  Ms. Krista Lee is a 34 y.o. No obstetric history on file who presents to clinic today to establish care.   Patient presents for an annual GYN exam and to discuss conception and dyspareunia. She reports 7 year history of painful intercourse; only certain positions will cause a very sharp pain on the right side that "feels like someone is pulling on my ovary." She reports history of ultrasounds and exploratory hysteroscopy at [facility], pt reports that nothing of note was found. She and her husband are now interested in conceiving and she wanted a consultation. This is their first time trying to conceive.   The following portions of the patient's history were reviewed and updated as appropriate: allergies, current medications, family history, past medical history, social history, past surgical history and problem list.  Review of Systems:  Review of Systems  Respiratory: Negative for shortness of breath.   Cardiovascular: Negative for chest pain and palpitations.  Genitourinary:       Negative for dysuria, discharge, AUB.       Objective:  Physical Exam BP 111/74   Pulse 75   Ht 5\' 6"  (1.676 m)   Wt 55.8 kg   LMP 12/21/2018 (Exact Date)   BMI 19.85 kg/m  Physical Exam  Constitutional: She is oriented to person, place, and time. She appears well-developed and well-nourished.  HENT:  Head: Normocephalic and atraumatic.  Eyes: Conjunctivae and EOM are normal.  Neck: No thyromegaly present.  Cardiovascular: Normal rate and regular rhythm. Exam reveals no gallop and no friction rub.  No murmur heard. Respiratory: Breath sounds normal. No respiratory distress. Right breast exhibits no inverted nipple, no mass and no tenderness. Left breast exhibits no inverted nipple, no mass and no tenderness. Breasts are symmetrical.  Genitourinary:    Uterus normal.  Cervix exhibits no motion tenderness and no friability. Right adnexum displays no mass and no tenderness. Left adnexum  displays no mass and no tenderness.    No signs of injury in the vagina.     Genitourinary Comments: Vaginal mucosa pink and without lesions. Cervix is nulliparous. Minimal white discharge present. PAP smear collected.    Musculoskeletal:        General: Normal range of motion.     Cervical back: Normal range of motion and neck supple.  Neurological: She is alert and oriented to person, place, and time.  Skin: Skin is warm and dry.  Psychiatric: She has a normal mood and affect. Her behavior is normal.    Labs and Imaging No results found for this or any previous visit (from the past 24 hour(s)).  No results found.   Assessment & Plan:  1. Encounter for preconception consultation Encouraged patient to start either prenatal or folic acid supplements.  Patient is determined and was encouraged to cease smoking and alcohol consumption.  Informed patient that it may take awhile to become pregnant, use of ovulation kits may help her success.  - Vitamin D (25 hydroxy) - CBC - Hemoglobin A1c - Rubella screen  2. Well woman exam with routine gynecological exam - PAP smear collected  - TSH  3. Chronic pelvic pain in female Encouraged use of tylenol prn for pain alleviation.  - Ambulatory referral to Physical Therapy  Patient records are requested from [facility]. Follow-up in 1 year unless she has any concerns or needs prenatal care before then.    [facility] 01/12/2019 4:39 PM

## 2019-01-13 LAB — CBC
Hematocrit: 44.6 % (ref 34.0–46.6)
Hemoglobin: 15 g/dL (ref 11.1–15.9)
MCH: 31.2 pg (ref 26.6–33.0)
MCHC: 33.6 g/dL (ref 31.5–35.7)
MCV: 93 fL (ref 79–97)
Platelets: 326 10*3/uL (ref 150–450)
RBC: 4.81 x10E6/uL (ref 3.77–5.28)
RDW: 12.4 % (ref 11.7–15.4)
WBC: 8.8 10*3/uL (ref 3.4–10.8)

## 2019-01-13 LAB — CYTOLOGY - PAP
Comment: NEGATIVE
Diagnosis: NEGATIVE
High risk HPV: NEGATIVE

## 2019-01-13 LAB — TSH: TSH: 2.06 u[IU]/mL (ref 0.450–4.500)

## 2019-01-13 LAB — HEMOGLOBIN A1C
Est. average glucose Bld gHb Est-mCnc: 94 mg/dL
Hgb A1c MFr Bld: 4.9 % (ref 4.8–5.6)

## 2019-01-13 LAB — VITAMIN D 25 HYDROXY (VIT D DEFICIENCY, FRACTURES): Vit D, 25-Hydroxy: 26.2 ng/mL — ABNORMAL LOW (ref 30.0–100.0)

## 2019-01-13 LAB — RUBELLA SCREEN: Rubella Antibodies, IGG: 5.98 index (ref >0.99)

## 2019-01-26 ENCOUNTER — Other Ambulatory Visit: Payer: Self-pay

## 2019-01-26 ENCOUNTER — Encounter: Payer: Self-pay | Admitting: Physical Therapy

## 2019-01-26 ENCOUNTER — Ambulatory Visit: Payer: 59 | Attending: Advanced Practice Midwife | Admitting: Physical Therapy

## 2019-01-26 DIAGNOSIS — R252 Cramp and spasm: Secondary | ICD-10-CM | POA: Insufficient documentation

## 2019-01-26 DIAGNOSIS — M25651 Stiffness of right hip, not elsewhere classified: Secondary | ICD-10-CM | POA: Insufficient documentation

## 2019-01-26 DIAGNOSIS — M6281 Muscle weakness (generalized): Secondary | ICD-10-CM | POA: Insufficient documentation

## 2019-01-26 NOTE — Patient Instructions (Addendum)
STRETCHING THE PELVIC FLOOR MUSCLES NO DILATOR  Supplies . Vaginal lubricant . Mirror (optional) . Gloves (optional) Positioning . Start in a semi-reclined position with your head propped up. Bend your knees and place your thumb or finger at the vaginal opening. Procedure . Apply a moderate amount of lubricant on the outer skin of your vagina, the labia minora.  Apply additional lubricant to your finger. Marland Kitchen Spread the skin away from the vaginal opening. Place the end of your finger at the opening. . Do a maximum contraction of the pelvic floor muscles. Tighten the vagina and the anus maximally and relax. . When you know they are relaxed, gently and slowly insert your finger into your vagina, directing your finger slightly downward, for 2-3 inches of insertion. . Relax and stretch the 6 o'clock position . Hold each stretch for _2 min__ and repeat __1_ time with rest breaks of _1__ seconds between each stretch. . Repeat the stretching in the 4 o'clock and 8 o'clock positions. . Total time should be _6__ minutes, _1__ x per day.  Note the amount of theme your were able to achieve and your tolerance to your finger in your vagina. . Once you have accomplished the techniques you may try them in standing with one foot resting on the tub, or in other positions.  This is a good stretch to do in the shower if you don't need to use lubricant.   then move your finger side to side Use your thumb and index finger to massage the right lower half of the pelvic floor.   [facility] [address], [address] [place], [place] [address] Phone # [phone] Fax [phone]

## 2019-01-26 NOTE — Therapy (Signed)
[facility] [address] [place], [place], [address] Phone: [phone]   Fax:  [phone]  Physical Therapy Evaluation  Patient Details  Name: Krista Lee MRN: [id number] Date of Birth: 10-31-1985 Referring Provider (PT): Kathleen Lime, PT   Encounter Date: 01/26/2019  PT End of Session - 01/26/19 1112    Visit Number  1    Date for PT Re-Evaluation  04/20/19    Authorization Type  BCBS    PT Start Time  1100    PT Stop Time  1140    PT Time Calculation (min)  40 min    Activity Tolerance  Patient tolerated treatment well    Behavior During Therapy  [facility] for tasks assessed/performed       Past Medical History:  Diagnosis Date  . ADD (attention deficit disorder)   . Allergy    no meds  . Anemia    as adolescent  . Anxiety   . Depression   . GERD (gastroesophageal reflux disease)    doesn't take any meds for this, diet controlled  . Headache(784.0)   . Heart murmur    as a child - no problems  . History of bronchitis    as a teenager, no problems as adult  . History of migraine    can't remember when the last one was  . Insomnia    no meds  . Panic attacks     Past Surgical History:  Procedure Laterality Date  . CHOLECYSTECTOMY  10/22/2011   Procedure: LAPAROSCOPIC CHOLECYSTECTOMY;  Surgeon: Harl Bowie, MD;  Location: [place];  Service: General;  Laterality: N/A;  . HYSTEROSCOPY WITH RESECTOSCOPE N/A 04/28/2013   Procedure:  HYSTEROSCOPY ;  Surgeon: Princess Bruins, MD;  Location: [facility];  Service: Gynecology;  Laterality: N/A;  . LAPAROSCOPY N/A 04/28/2013   Procedure: LAPAROSCOPY DIAGNOSTIC;  Surgeon: Princess Bruins, MD;  Location: [facility];  Service: Gynecology;  Laterality: N/A;  2 hrs. total  . WISDOM TOOTH EXTRACTION  1yrs ago    There were no vitals filed for this visit.   Subjective Assessment - 01/26/19 1107    Subjective  I have a severe pain in the right lower  quadrant that disrupts intercourse. When had the laproscopic procedure nothing was found.    Patient Stated Goals  reduce pain with intercourse    Currently in Pain?  Yes    Pain Score  10-Worst pain ever    Pain Location  Pelvis    Pain Orientation  Right;Lower    Pain Descriptors / Indicators  Stabbing;Sharp;Moaning    Pain Type  Chronic pain    Pain Onset  More than a month ago    Pain Frequency  Intermittent    Aggravating Factors   penile penetration when she is in a seated style position, her on top seated, supine with legs up; legs in 90 degree hip flexion    Pain Relieving Factors  change positions    Multiple Pain Sites  No         [facility] PT Assessment - 01/26/19 0001      Assessment   Medical Diagnosis  R10.2, G89.29 Chronic pelvic pain in female    Referring Provider (PT)  Kathleen Lime, PT    Onset Date/Surgical Date  --   chronic since 2015   Prior Therapy  none      Precautions   Precautions  Other (comment)    Precaution Comments  trying  to conceive      Restrictions   Weight Bearing Restrictions  No      Balance Screen   Has the patient fallen in the past 6 months  No    Has the patient had a decrease in activity level because of a fear of falling?   No    Is the patient reluctant to leave their home because of a fear of falling?   No      Home Public house manager residence      Prior Function   Level of Independence  Independent    Vocation  Full time employment    Vocation Requirements  standing    Leisure  yoga      Cognition   Overall Cognitive Status  Within Functional Limits for tasks assessed      Posture/Postural Control   Posture/Postural Control  No significant limitations      ROM / Strength   AROM / PROM / Strength  AROM;PROM;Strength      AROM   Overall AROM Comments  lumbar ROM is full      PROM   Right Hip Flexion  115    Right Hip External Rotation   70    Left Hip Flexion  125    Left Hip  External Rotation   65      Strength   Right Hip External Rotation   4/5    Right Hip ABduction  4/5    Left Hip External Rotation  4/5    Left Hip ABduction  4/5      Palpation   SI assessment   right ilium rotated anteriorly                Objective measurements completed on examination: See above findings.    Pelvic Floor Special Questions - 01/26/19 0001    Currently Sexually Active  Yes    Is this Painful  Yes    Marinoff Scale  pain interrupts completion    Urinary Leakage  Yes    Activities that cause leaking  Coughing;Sneezing    Fecal incontinence  No    Skin Integrity  Intact    Perineal Body/Introitus   Elevated    Prolapse  None    Pelvic Floor Internal Exam  Patient confirms identifcation and approves PT to assess pelvic floor and treatment    Exam Type  Vaginal    Palpation  thickness in the right pelvic floor muscles, tenderness along bil. obturator internist and ATFP; thickness of the perineal body    Strength  weak squeeze, no lift               PT Education - 01/26/19 1237    Education Details  educated patient on how to massage the pelvic floor muscles to improve mobility    Person(s) Educated  Patient    Methods  Explanation;Handout    Comprehension  Verbalized understanding       PT Short Term Goals - 01/26/19 1245      PT SHORT TERM GOAL #1   Title  independent with initial HEP    Time  4    Period  Weeks    Status  New    Target Date  02/23/19      PT SHORT TERM GOAL #2   Title  understand how to massage the pelvic floor muscles and perineal body to elongate tissue    Time  4  Period  Weeks    Status  New    Target Date  02/23/19      PT SHORT TERM GOAL #3   Title  understand on standing with equal weight on bilateral extremities to decrease tone on right pelvic floor    Time  4    Period  Weeks    Status  New    Target Date  02/23/19        PT Long Term Goals - 01/26/19 1246      PT LONG TERM GOAL #1    Title  independent with advanced HEP    Time  12    Period  Weeks    Status  New    Target Date  04/20/19      PT LONG TERM GOAL #2   Title  able to elongate the pelvic floor muscles to promote relaxation with penile penetration    Time  12    Period  Weeks      PT LONG TERM GOAL #3   Title  pain with penile penetration </= 1-2/10 due to pelvis in correct alignment    Time  12    Period  Weeks    Status  New    Target Date  04/20/19      PT LONG TERM GOAL #4   Title  left hip flexion >/= 125 degrees so she is able to sit at 90 degrees for penile penetration    Time  12    Period  Weeks    Status  New    Target Date  04/20/19      PT LONG TERM GOAL #5   Title  able to have penile penetration without being anxious to prevent tightening of the tissue    Time  12    Period  Weeks    Status  New    Target Date  04/20/19             Plan - 01/26/19 1237    Clinical Impression Statement  Patient is a 34 year old female with right pelvic pain since 2015. Patient will have pain at level 10/10 with penile penetration when her hips are at 90 degrees flexion of the hips. Patient has tenderness in the lower abdominal area. Patient internally has increased tone of the right pelvic floor muscles and tenderness in bilateral obturator internist and ATFP. Pelvic floor strength 2/5 and difficulty with relaxation of the pelvic floor. Right ilium is rotated anteriorly. Decreased right hip flexion and external rotation. Bilateral hip abduction 4/5. Patient will benefit from skilled therapy to relax the pelvic floor muscles and ability to elongate to reduce her pain.    Personal Factors and Comorbidities  Comorbidity 1;Sex    Comorbidities  s/p laproscopic surgeray    Examination-Participation Restrictions  Interpersonal Relationship    Stability/Clinical Decision Making  Evolving/Moderate complexity    Clinical Decision Making  Low    Rehab Potential  Excellent    PT Frequency  1x / week     PT Duration  12 weeks    PT Treatment/Interventions  Biofeedback;Cryotherapy;Electrical Stimulation;Moist Heat;Ultrasound;Neuromuscular re-education;Therapeutic exercise;Therapeutic activities;Patient/family education;Manual techniques;Dry needling    PT Next Visit Plan  correct right ilium, pelvic floor soft tissue work, bulging of the pelvic floor, diaphgramatic breathing    Consulted and Agree with Plan of Care  Patient       Patient will benefit from skilled therapeutic intervention in order to improve the following deficits and  impairments:  Decreased coordination, Decreased range of motion, Increased fascial restricitons, Increased muscle spasms, Decreased activity tolerance, Pain, Impaired flexibility, Decreased strength  Visit Diagnosis: Cramp and spasm - Plan: PT plan of care cert/re-cert  Muscle weakness (generalized) - Plan: PT plan of care cert/re-cert  Stiffness of right hip, not elsewhere classified - Plan: PT plan of care cert/re-cert     Problem List Patient Active Problem List   Diagnosis Date Noted  . Symptomatic cholelithiasis 09/25/2011  . Biliary dyskinesia 09/25/2011  . Abdominal pain 08/24/2011    Eulis Foster, PT 01/26/19 12:52 PM   [facility] [address], [address] [place], [place], [address] Phone: [phone]   Fax:  [phone]  Name: Krista Lee MRN: [id number] Date of Birth: 11-Sep-1985

## 2019-02-09 ENCOUNTER — Encounter: Payer: Self-pay | Admitting: Physical Therapy

## 2019-02-09 ENCOUNTER — Ambulatory Visit: Payer: 59 | Attending: Advanced Practice Midwife | Admitting: Physical Therapy

## 2019-02-09 ENCOUNTER — Other Ambulatory Visit: Payer: Self-pay

## 2019-02-09 DIAGNOSIS — M25651 Stiffness of right hip, not elsewhere classified: Secondary | ICD-10-CM | POA: Insufficient documentation

## 2019-02-09 DIAGNOSIS — R252 Cramp and spasm: Secondary | ICD-10-CM | POA: Insufficient documentation

## 2019-02-09 DIAGNOSIS — M6281 Muscle weakness (generalized): Secondary | ICD-10-CM | POA: Diagnosis present

## 2019-02-09 NOTE — Therapy (Signed)
[facility] [address], [address] [place], [place], [address] Phone: [phone]   Fax:  [phone]  Physical Therapy Treatment  Patient Details  Name: Krista Lee MRN: [id number] Date of Birth: Jun 12, 1985 Referring Provider (PT): Ulis Rias, PT   Encounter Date: 02/09/2019  PT End of Session - 02/09/19 1445    Visit Number  2    Date for PT Re-Evaluation  04/20/19    Authorization Type  BCBS    PT Start Time  1400    PT Stop Time  1440    PT Time Calculation (min)  40 min    Activity Tolerance  Patient tolerated treatment well    Behavior During Therapy  [facility] for tasks assessed/performed       Past Medical History:  Diagnosis Date  . ADD (attention deficit disorder)   . Allergy    no meds  . Anemia    as adolescent  . Anxiety   . Depression   . GERD (gastroesophageal reflux disease)    doesn't take any meds for this, diet controlled  . Headache(784.0)   . Heart murmur    as a child - no problems  . History of bronchitis    as a teenager, no problems as adult  . History of migraine    can't remember when the last one was  . Insomnia    no meds  . Panic attacks     Past Surgical History:  Procedure Laterality Date  . CHOLECYSTECTOMY  10/22/2011   Procedure: LAPAROSCOPIC CHOLECYSTECTOMY;  Surgeon: Shelly Rubenstein, MD;  Location: [facility];  Service: General;  Laterality: N/A;  . HYSTEROSCOPY WITH RESECTOSCOPE N/A 04/28/2013   Procedure:  HYSTEROSCOPY ;  Surgeon: Genia Del, MD;  Location: [facility];  Service: Gynecology;  Laterality: N/A;  . LAPAROSCOPY N/A 04/28/2013   Procedure: LAPAROSCOPY DIAGNOSTIC;  Surgeon: Genia Del, MD;  Location: [facility];  Service: Gynecology;  Laterality: N/A;  2 hrs. total  . WISDOM TOOTH EXTRACTION  42yrs ago    There were no vitals filed for this visit.  Subjective Assessment - 02/09/19 1406    Subjective  I am doing the internal work. I feel a line that  the right is more firm than the left.    Patient Stated Goals  reduce pain with intercourse    Currently in Pain?  Yes    Pain Score  10-Worst pain ever    Pain Location  Pelvis    Pain Orientation  Right;Lower    Pain Descriptors / Indicators  Stabbing;Sharp    Pain Type  Chronic pain    Pain Onset  More than a month ago    Pain Frequency  Intermittent    Aggravating Factors   penile penetration when is in a seated style position, her on top seated, supine with legs up; legs in 90 degree hip flexion    Pain Relieving Factors  change position    Multiple Pain Sites  No         [facility] PT Assessment - 02/09/19 0001      PROM   Right Hip Flexion  130    Right Hip External Rotation   85      Palpation   SI assessment   right ilium rotated anteriorly                   [facility] Adult PT Treatment/Exercise - 02/09/19 0001      Self-Care  Self-Care  Other Self-Care Comments    Other Self-Care Comments   reviewed how to massage the perineum and pelvic floor muscles      Lumbar Exercises: Stretches   Piriformis Stretch  Right;Left;30 seconds    Piriformis Stretch Limitations  pigeon pose    Other Lumbar Stretch Exercise  butterfly stretch with diaphragmatic breathing    Other Lumbar Stretch Exercise  happy baby with breath      Lumbar Exercises: Supine   Bridge  10 reps;5 seconds    Bridge Limitations  keeping pelvis leveled      Lumbar Exercises: Quadruped   Madcat/Old Horse  10 reps      Manual Therapy   Manual Therapy  Joint mobilization;Muscle Energy Technique;Soft tissue mobilization    Joint Mobilization  P-A mobilizaiton and rotational to T10-L4; using mulligan belt to mobilize right hip for distraction, lateral glide, and inferior glide; prone anterior glide of right femoral head    Soft tissue mobilization  right psoas    Muscle Energy Technique  correct right ilium             PT Education - 02/09/19 1445    Education Details  Access Code:  [email or web address]    Person(s) Educated  Patient    Methods  Explanation;Demonstration;Verbal cues;Handout    Comprehension  Verbalized understanding;Returned demonstration       PT Short Term Goals - 01/26/19 1245      PT SHORT TERM GOAL #1   Title  independent with initial HEP    Time  4    Period  Weeks    Status  New    Target Date  02/23/19      PT SHORT TERM GOAL #2   Title  understand how to massage the pelvic floor muscles and perineal body to elongate tissue    Time  4    Period  Weeks    Status  New    Target Date  02/23/19      PT SHORT TERM GOAL #3   Title  understand on standing with equal weight on bilateral extremities to decrease tone on right pelvic floor    Time  4    Period  Weeks    Status  New    Target Date  02/23/19        PT Long Term Goals - 01/26/19 1246      PT LONG TERM GOAL #1   Title  independent with advanced HEP    Time  12    Period  Weeks    Status  New    Target Date  04/20/19      PT LONG TERM GOAL #2   Title  able to elongate the pelvic floor muscles to promote relaxation with penile penetration    Time  12    Period  Weeks      PT LONG TERM GOAL #3   Title  pain with penile penetration </= 1-2/10 due to pelvis in correct alignment    Time  12    Period  Weeks    Status  New    Target Date  04/20/19      PT LONG TERM GOAL #4   Title  left hip flexion >/= 125 degrees so she is able to sit at 90 degrees for penile penetration    Time  12    Period  Weeks    Status  New    Target Date  04/20/19  PT LONG TERM GOAL #5   Title  able to have penile penetration without being anxious to prevent tightening of the tissue    Time  12    Period  Weeks    Status  New    Target Date  04/20/19            Plan - 02/09/19 1429    Clinical Impression Statement  After manual work, patient had increased in right hip flexion and external rotation. Patient pelvis was in correct alignment. Reviewed how to perform perineal massage and  patient doing correctly. Patient understands how to elongate the pelvic floor muscles. Patient will benefit from skilled therapy to relax the pelvic floor muscles and abdility to elongate to reduce her pain.    Personal Factors and Comorbidities  Comorbidity 1;Sex    Comorbidities  s/p laproscopic surgeray    Examination-Participation Restrictions  Interpersonal Relationship    Stability/Clinical Decision Making  Evolving/Moderate complexity    Rehab Potential  Excellent    PT Frequency  1x / week    PT Duration  12 weeks    PT Treatment/Interventions  Biofeedback;Cryotherapy;Electrical Stimulation;Moist Heat;Ultrasound;Neuromuscular re-education;Therapeutic exercise;Therapeutic activities;Patient/family education;Manual techniques;Dry needling    PT Next Visit Plan  internal soft tissue work to the right pelvic floor and perineal body    PT Home Exercise Plan  Access Code:    Recommended Other Services  MD signed intitial eval    Consulted and Agree with Plan of Care  Patient       Patient will benefit from skilled therapeutic intervention in order to improve the following deficits and impairments:  Decreased coordination, Decreased range of motion, Increased fascial restricitons, Increased muscle spasms, Decreased activity tolerance, Pain, Impaired flexibility, Decreased strength  Visit Diagnosis: Cramp and spasm  Muscle weakness (generalized)  Stiffness of right hip, not elsewhere classified     Problem List Patient Active Problem List   Diagnosis Date Noted  . Symptomatic cholelithiasis 09/25/2011  . Biliary dyskinesia 09/25/2011  . Abdominal pain 08/24/2011    Eulis Foster, PT 02/09/19 2:49 PM   [facility] [address], [address] [place], [place], [address] Phone: [phone]   Fax:  [phone]  Name: TINEY ZIPPER MRN: [id number] Date of Birth: 03-Sep-1985

## 2019-02-09 NOTE — Patient Instructions (Signed)
Access Code:  URL: [email or web address]  Date: 02/09/2019  Prepared by: Eulis Foster   Exercises Supine Butterfly Groin Stretch - 1 reps - 1 sets - 1-2 min hold - 1x daily - 7x weekly Pilates Bridge - 10 reps - 1 sets - 5 sec hold - 1x daily - 7x weekly Supine Pelvic Floor Stretch - 1 reps - 1 sets - 1-2 min hold - 1x daily - 7x weekly Cat-Camel - 10 reps - 1 sets - 1x daily - 7x weekly [facility] [address], [address] [place], [place] [address] Phone # [phone] Fax [phone]

## 2019-02-23 ENCOUNTER — Other Ambulatory Visit: Payer: Self-pay

## 2019-02-23 ENCOUNTER — Encounter: Payer: Self-pay | Admitting: Physical Therapy

## 2019-02-23 ENCOUNTER — Ambulatory Visit: Payer: 59 | Admitting: Physical Therapy

## 2019-02-23 DIAGNOSIS — M6281 Muscle weakness (generalized): Secondary | ICD-10-CM

## 2019-02-23 DIAGNOSIS — M25651 Stiffness of right hip, not elsewhere classified: Secondary | ICD-10-CM

## 2019-02-23 DIAGNOSIS — R252 Cramp and spasm: Secondary | ICD-10-CM | POA: Diagnosis not present

## 2019-02-23 NOTE — Therapy (Signed)
[facility] [address] [place], [place], [address] Phone: [phone]   Fax:  [phone]  Physical Therapy Treatment  Patient Details  Name: Krista Lee MRN: [id number] Date of Birth: 05/12/1985 Referring Provider (PT): Kathleen Lime, PT   Encounter Date: 02/23/2019  PT End of Session - 02/23/19 1234    Visit Number  3    Date for PT Re-Evaluation  04/20/19    Authorization Type  BCBS    PT Start Time  1230    PT Stop Time  1310    PT Time Calculation (min)  40 min    Activity Tolerance  Patient tolerated treatment well    Behavior During Therapy  [facility] for tasks assessed/performed       Past Medical History:  Diagnosis Date  . ADD (attention deficit disorder)   . Allergy    no meds  . Anemia    as adolescent  . Anxiety   . Depression   . GERD (gastroesophageal reflux disease)    doesn't take any meds for this, diet controlled  . Headache(784.0)   . Heart murmur    as a child - no problems  . History of bronchitis    as a teenager, no problems as adult  . History of migraine    can't remember when the last one was  . Insomnia    no meds  . Panic attacks     Past Surgical History:  Procedure Laterality Date  . CHOLECYSTECTOMY  10/22/2011   Procedure: LAPAROSCOPIC CHOLECYSTECTOMY;  Surgeon: Harl Bowie, MD;  Location: [place];  Service: General;  Laterality: N/A;  . HYSTEROSCOPY WITH RESECTOSCOPE N/A 04/28/2013   Procedure:  HYSTEROSCOPY ;  Surgeon: Princess Bruins, MD;  Location: [place] [facility];  Service: Gynecology;  Laterality: N/A;  . LAPAROSCOPY N/A 04/28/2013   Procedure: LAPAROSCOPY DIAGNOSTIC;  Surgeon: Princess Bruins, MD;  Location: [place] [facility];  Service: Gynecology;  Laterality: N/A;  2 hrs. total  . WISDOM TOOTH EXTRACTION  56yrs ago    There were no vitals filed for this visit.  Subjective Assessment - 02/23/19 1235    Subjective  I had no power so not able to do everything. I  have tried diaphragmatic breathing with intercourse and it was not able to calm down.    Patient Stated Goals  reduce pain with intercourse    Currently in Pain?  Yes    Pain Score  10-Worst pain ever    Pain Location  Pelvis    Pain Orientation  Right;Lower    Pain Descriptors / Indicators  Stabbing;Sharp    Pain Type  Chronic pain    Pain Onset  More than a month ago    Pain Frequency  Intermittent    Aggravating Factors   penile penetration when is in a seated style position, her on top seated, supine with legs up; legs in 90 degree hip flexion    Pain Relieving Factors  change position    Multiple Pain Sites  No         [facility] PT Assessment - 02/23/19 0001      Palpation   SI assessment   ASIS are equal      Special Tests    Special Tests  Hip Special Tests    Hip Special Tests   Anterior Hip Impingement Test      Anterior Hip Impingement Test    Findings  Positive    Side  Right    Comments  pain                   [facility] Adult PT Treatment/Exercise - 02/23/19 0001      Therapeutic Activites    Therapeutic Activities  Other Therapeutic Activities    Other Therapeutic Activities  Discussed with patient on hip position while sleeping and when to use a pillow; discussed when to see patient and not when she is on her cycle      Manual Therapy   Manual Therapy  Joint mobilization;Internal Pelvic Floor    Joint Mobilization  inferior, anterior and lateral glide of  right hip    Internal Pelvic Floor  soft tissue work to the right pelvic floor muscles with hip movement and fascial release              PT Education - 02/23/19 1319    Education Details  education on sleeping positions with a pillow; making an appointment with orth to clear her hip    Person(s) Educated  Patient    Methods  Explanation    Comprehension  Verbalized understanding       PT Short Term Goals - 02/23/19 1324      PT SHORT TERM GOAL #1   Title  independent with initial HEP     Time  4    Period  Weeks    Status  Achieved      PT SHORT TERM GOAL #2   Title  understand how to massage the pelvic floor muscles and perineal body to elongate tissue    Time  4    Period  Weeks    Status  Achieved      PT SHORT TERM GOAL #3   Title  understand on standing with equal weight on bilateral extremities to decrease tone on right pelvic floor    Time  4    Period  Weeks    Status  On-going        PT Long Term Goals - 01/26/19 1246      PT LONG TERM GOAL #1   Title  independent with advanced HEP    Time  12    Period  Weeks    Status  New    Target Date  04/20/19      PT LONG TERM GOAL #2   Title  able to elongate the pelvic floor muscles to promote relaxation with penile penetration    Time  12    Period  Weeks      PT LONG TERM GOAL #3   Title  pain with penile penetration </= 1-2/10 due to pelvis in correct alignment    Time  12    Period  Weeks    Status  New    Target Date  04/20/19      PT LONG TERM GOAL #4   Title  left hip flexion >/= 125 degrees so she is able to sit at 90 degrees for penile penetration    Time  12    Period  Weeks    Status  New    Target Date  04/20/19      PT LONG TERM GOAL #5   Title  able to have penile penetration without being anxious to prevent tightening of the tissue    Time  12    Period  Weeks    Status  New    Target Date  04/20/19  Plan - 02/23/19 1235    Clinical Impression Statement  Patient pelvis was in correct alignment. Patient continues to have right anterior hip pain with intercourse when her right hip is at 90 degrees. Patient had a positive anterior right hip impingement test with pain. Patient had improved right hip flexion after manual work. Patient will be making an orthopedic appointment for her right hip to clear it. Patient had fascial release of the right side of the bladder and minimal pain on palpation on the right pelvic floor muscles. Patient will benefit from skilled  therapy to relax the pelvic floor muscles and ability to elongate the muscles to reduce her pain.    Personal Factors and Comorbidities  Comorbidity 1;Sex    Comorbidities  s/p laproscopic surgery    Examination-Participation Restrictions  Interpersonal Relationship    Stability/Clinical Decision Making  Evolving/Moderate complexity    Rehab Potential  Excellent    PT Frequency  1x / week    PT Duration  12 weeks    PT Treatment/Interventions  Biofeedback;Cryotherapy;Electrical Stimulation;Moist Heat;Ultrasound;Neuromuscular re-education;Therapeutic exercise;Therapeutic activities;Patient/family education;Manual techniques;Dry needling    PT Next Visit Plan  internal soft tissue work to the right pelvic floor and perineal body; see if patient saw the orthopedic surgeon    PT Home Exercise Plan  Access Code:    Consulted and Agree with Plan of Care  Patient       Patient will benefit from skilled therapeutic intervention in order to improve the following deficits and impairments:  Decreased coordination, Decreased range of motion, Increased fascial restricitons, Increased muscle spasms, Decreased activity tolerance, Pain, Impaired flexibility, Decreased strength  Visit Diagnosis: Cramp and spasm  Muscle weakness (generalized)  Stiffness of right hip, not elsewhere classified     Problem List Patient Active Problem List   Diagnosis Date Noted  . Symptomatic cholelithiasis 09/25/2011  . Biliary dyskinesia 09/25/2011  . Abdominal pain 08/24/2011    Eulis Foster, PT 02/23/19 1:25 PM   [facility] [address] [place], [place], [address] Phone: [phone]   Fax:  [phone]  Name: Krista Lee MRN: [id number] Date of Birth: [birth year]

## 2019-03-02 ENCOUNTER — Ambulatory Visit: Payer: Self-pay

## 2019-03-02 ENCOUNTER — Ambulatory Visit (INDEPENDENT_AMBULATORY_CARE_PROVIDER_SITE_OTHER): Payer: 59 | Admitting: Orthopaedic Surgery

## 2019-03-02 ENCOUNTER — Other Ambulatory Visit: Payer: Self-pay

## 2019-03-02 DIAGNOSIS — M25551 Pain in right hip: Secondary | ICD-10-CM

## 2019-03-02 NOTE — Progress Notes (Signed)
Krista Lee comes in today for evaluation treatment of right hip pain.  She has been having hip pain for over a year now which is the right hip.  It hurts mainly when she significantly flexes her hip.  She has been going to a pelvic floor physical therapist because this was also intercourse related.  She actually says she may be pregnant right now.  She will know in a few days.  She does not hurt with weightbearing or with rotation of her hip.  Is mainly during hyperflexion of the hip on the right side.  She has never injured this hip before.  On examination of her right hip, I can easily put her through internal and external rotation which is full and causes no pain at all.  When I hyperflex her hip she does report hip pain and its in the anterior aspect of her hip where she points to an anterior thigh.  We did not image her hip today due to the possibility that she may be pregnant.  I do feel that this may be irritation of her right iliopsoas tendon.  I would like her to see Krista Lee in 2 weeks from today for an ultrasound assessment of her right hip.  If she is not pregnant by then, I would recommend a steroid injection in the iliopsoas tendon if he is able to do that under ultrasound.  She agrees with this treatment plan.  All question concerns were answered and addressed.  After she sees Krista Lee he can always get her back to me a few weeks later.

## 2019-03-09 ENCOUNTER — Encounter: Payer: 59 | Admitting: Physical Therapy

## 2019-03-16 ENCOUNTER — Other Ambulatory Visit: Payer: Self-pay

## 2019-03-16 ENCOUNTER — Ambulatory Visit: Payer: Self-pay

## 2019-03-16 ENCOUNTER — Encounter: Payer: Self-pay | Admitting: Family Medicine

## 2019-03-16 ENCOUNTER — Ambulatory Visit (INDEPENDENT_AMBULATORY_CARE_PROVIDER_SITE_OTHER): Payer: 59 | Admitting: Family Medicine

## 2019-03-16 DIAGNOSIS — M25551 Pain in right hip: Secondary | ICD-10-CM | POA: Diagnosis not present

## 2019-03-16 NOTE — Progress Notes (Signed)
IVEE Lee - 34 y.o. female MRN [id number]  Date of birth: [birth year]  Office Visit Note: Visit Date: 03/16/2019 PCP: Patient, No Pcp Per Referred by: No ref. provider found  Subjective: Chief Complaint  Patient presents with  . Right Hip - Pain, Follow-up    Per Dr. Ninfa Linden ultrasound of rt hip----pelvic pain x yrs, seen pelvic floor PT. Cant pull knee to chest   HPI: Krista Lee is a 34 y.o. female who comes in today as a referral from Dr. Ninfa Linden for ultrasound of right hip. She has anterior hip pain worse with passive flexion that bothers her mostly during yoga. She also has pelvic pain that hurts during intercourse and has been seeing a pelvic floor PT specialist with difficulty determining source of pain. Has been taking tumeric for carpal tunnel syndrome which is helping with that pain. No hip pain with running or lifting knee actively.     ROS Otherwise per HPI.  Assessment & Plan: Visit Diagnoses:  1. Pain in right hip     Plan: Ultrasound shows partial tear in labrum that appears to be getting pinched with passive flexion but this does not explain pelvic pain during intercourse. At this time, the pain is not bothering her significantly so she decided not to proceed with injection. Return as needed.  Meds & Orders: No orders of the defined types were placed in this encounter.   Orders Placed This Encounter  Procedures  . US Guided Needle Placement    Follow-up: No follow-ups on file.   Procedures: No procedures performed  No notes on file   Clinical History: No specialty comments available.   She reports that she has been smoking cigarettes. She has a 4.00 pack-year smoking history. She quit smokeless tobacco use about 8 years ago.  Recent Labs    01/12/19 1514  HGBA1C 4.9    Objective:  VS:  HT:    WT:   BMI:     BP:   HR: bpm  TEMP: ( )  RESP:  Physical Exam  PHYSICAL EXAM: Gen: NAD, alert, cooperative with exam, well-appearing HEENT:  clear conjunctiva,  CV:  no edema, capillary refill brisk, normal rate Resp: non-labored Skin: no rashes, normal turgor  Neuro: no gross deficits.  Psych:  alert and oriented  Ortho Exam  Hip:  - Inspection: No gross deformity, no swelling, erythema, or ecchymosis - Palpation: TTP over anterior hip - ROM: Normal range of motion on Flexion, extension, abduction, internal and external rotation. No clicking/popping noted with flexion of leg and abduction outward. Pain with passive flexion of hip - Strength: Normal strength. - Neuro/vasc: NV intact distally - Special Tests: Negative FABER. Mild pain with FADIR (internal ROM on right is ~25 degrees compared to ~45 degrees IR on the left).    Imaging: [place] of hip showed normal iliopsoas in longitudinal and transverse views, normal rectus femoris and sartorius. Labrum with small hypoechoic defect with calcification. No impingement noted with dynamic imaging.  Impression: small labral tear in right hip  Past Medical/Family/Surgical/Social History: Medications & Allergies reviewed per EMR, new medications updated. Patient Active Problem List   Diagnosis Date Noted  . Symptomatic cholelithiasis 09/25/2011  . Biliary dyskinesia 09/25/2011  . Abdominal pain 08/24/2011   Past Medical History:  Diagnosis Date  . ADD (attention deficit disorder)   . Allergy    no meds  . Anemia    as adolescent  . Anxiety   . Depression   .  GERD (gastroesophageal reflux disease)    doesn't take any meds for this, diet controlled  . Headache(784.0)   . Heart murmur    as a child - no problems  . History of bronchitis    as a teenager, no problems as adult  . History of migraine    can't remember when the last one was  . Insomnia    no meds  . Panic attacks    Family History  Problem Relation Age of Onset  . Hypertension Mother   . Diverticulitis Mother   . Hyperlipidemia Mother   . Arthritis Father    Past Surgical History:  Procedure  Laterality Date  . CHOLECYSTECTOMY  10/22/2011   Procedure: LAPAROSCOPIC CHOLECYSTECTOMY;  Surgeon: Shelly Rubenstein, MD;  Location[facility];  Service: General;  Laterality: N/A;  . HYSTEROSCOPY WITH RESECTOSCOPE N/A 04/28/2013   Procedure:  HYSTEROSCOPY ;  Surgeon: Genia Del, MD;  Location: [facility];  Service: Gynecology;  Laterality: N/A;  . LAPAROSCOPY N/A 04/28/2013   Procedure: LAPAROSCOPY DIAGNOSTIC;  Surgeon: Genia Del, MD;  Location: [facility];  Service: Gynecology;  Laterality: N/A;  2 hrs. total  . WISDOM TOOTH EXTRACTION  19yrs ago   Social History   Occupational History  . Occupation: Hair Stylist  Tobacco Use  . Smoking status: Current Every Day Smoker    Packs/day: 0.50    Years: 8.00    Pack years: 4.00    Types: Cigarettes  . Smokeless tobacco: Former Neurosurgeon    Quit date: 01/02/2011  . Tobacco comment: has used e-cig in the past; counseled on smoking cessation, not currently using   Substance and Sexual Activity  . Alcohol use: Yes    Alcohol/week: 14.0 standard drinks    Types: 14 Standard drinks or equivalent per week    Comment: 2-3 glasses wine nightly  . Drug use: No  . Sexual activity: Yes    Birth control/protection: None

## 2019-03-16 NOTE — Progress Notes (Signed)
I saw and examined the patient with Dr. Robby Sermon and agree with assessment and plan as outlined.    Two types of chronic left hip pain.  One is during intercourse, and has been present since her early 60s.  Laproscopic surgery was done with no source revealed.  Doing pelvic floor PT, but unable to locate the pain or improve symptoms.  Other pain is with passive flexion of the hip.  It's not nearly as severe as the intercourse pain.   MSK-US exam shows normal appearing iliopsoas tendon with no bursitis.  Palpation of tendon did not recreate pain.  Anterior labrum looks partially torn.  Dynamic imaging did not show definite impingement.  Palpation over labrum reproduced her second type of pain, but not the intercourse pain.    Elected to not do diagnostic injection today.  If symptoms worsen in future, she can come in for one.

## 2019-03-30 ENCOUNTER — Other Ambulatory Visit: Payer: Self-pay

## 2019-03-30 ENCOUNTER — Encounter: Payer: Self-pay | Admitting: Physical Therapy

## 2019-03-30 ENCOUNTER — Ambulatory Visit: Payer: 59 | Attending: Advanced Practice Midwife | Admitting: Physical Therapy

## 2019-03-30 DIAGNOSIS — M6281 Muscle weakness (generalized): Secondary | ICD-10-CM | POA: Diagnosis present

## 2019-03-30 DIAGNOSIS — M25651 Stiffness of right hip, not elsewhere classified: Secondary | ICD-10-CM | POA: Insufficient documentation

## 2019-03-30 DIAGNOSIS — R252 Cramp and spasm: Secondary | ICD-10-CM | POA: Insufficient documentation

## 2019-03-30 NOTE — Patient Instructions (Addendum)
Slow Contraction: Gravity Eliminated (Hook-Lying)    Lie with hips and knees bent. Slowly squeeze pelvic floor for _5__ seconds. Rest for _5__ seconds. Repeat _5__ times. Do __2_ times a day.   Copyright  VHI. All rights reserved.    Slow Contraction: Gravity Resisted (Sitting)    Sitting, slowly squeeze pelvic floor for _5__ seconds. Rest for _5__ seconds. Repeat __5_ times. Do __2_ times a day.  Copyright  VHI. All rights reserved.  [facility] [address], [address] [place], [place] [address] Phone # [phone] Fax [phone]

## 2019-03-30 NOTE — Therapy (Signed)
[facility] [address], [place], [place], [address] Phone: [phone]   Fax:  [phone]  Physical Therapy Treatment  Patient Details  Name: Krista Lee MRN: [id number] Date of Birth: September 24, 1985 Referring Provider (PT): Kathleen Lime, PT   Encounter Date: 03/30/2019  PT End of Session - 03/30/19 1359    Visit Number  4    Date for PT Re-Evaluation  04/20/19    Authorization Type  BCBS    PT Start Time  1145    PT Stop Time  1225    PT Time Calculation (min)  40 min    Activity Tolerance  Patient tolerated treatment well    Behavior During Therapy  [facility] for tasks assessed/performed       Past Medical History:  Diagnosis Date  . ADD (attention deficit disorder)   . Allergy    no meds  . Anemia    as adolescent  . Anxiety   . Depression   . GERD (gastroesophageal reflux disease)    doesn't take any meds for this, diet controlled  . Headache(784.0)   . Heart murmur    as a child - no problems  . History of bronchitis    as a teenager, no problems as adult  . History of migraine    can't remember when the last one was  . Insomnia    no meds  . Panic attacks     Past Surgical History:  Procedure Laterality Date  . CHOLECYSTECTOMY  10/22/2011   Procedure: LAPAROSCOPIC CHOLECYSTECTOMY;  Surgeon: Harl Bowie, MD;  Location: [place];  Service: General;  Laterality: N/A;  . HYSTEROSCOPY WITH RESECTOSCOPE N/A 04/28/2013   Procedure:  HYSTEROSCOPY ;  Surgeon: Princess Bruins, MD;  Location: [facility];  Service: Gynecology;  Laterality: N/A;  . LAPAROSCOPY N/A 04/28/2013   Procedure: LAPAROSCOPY DIAGNOSTIC;  Surgeon: Princess Bruins, MD;  Location: [facility];  Service: Gynecology;  Laterality: N/A;  2 hrs. total  . WISDOM TOOTH EXTRACTION  36yrs ago    There were no vitals filed for this visit.  Subjective Assessment - 03/30/19 1155    Subjective  The doctor does not think the right lower  abdominal pain is not from the hip. Not pregnant.    Patient Stated Goals  reduce pain with intercourse    Currently in Pain?  Yes    Pain Score  10-Worst pain ever    Pain Location  Pelvis    Pain Orientation  Right;Lower    Pain Descriptors / Indicators  Sharp;Stabbing    Pain Type  Chronic pain    Pain Onset  More than a month ago    Pain Frequency  Intermittent    Aggravating Factors   penile penetration when in a seated style position, her on top seated, supine with legs up; legs i 90 degree hip flexion    Pain Relieving Factors  change position    Multiple Pain Sites  No         [facility] PT Assessment - 03/30/19 0001      Assessment   Medical Diagnosis  R10.2, G89.29 Chronic pelvic pain in female    Referring Provider (PT)  Kathleen Lime, PT    Prior Therapy  none      Precautions   Precautions  Other (comment)    Precaution Comments  trying to conceive      Restrictions   Weight Bearing Restrictions  No  Prior Function   Level of Independence  Independent    Vocation  Full time employment    Vocation Requirements  standing    Leisure  yoga      Cognition   Overall Cognitive Status  Within Functional Limits for tasks assessed      Posture/Postural Control   Posture/Postural Control  No significant limitations      ROM / Strength   AROM / PROM / Strength  AROM;PROM;Strength                Pelvic Floor Special Questions - 03/30/19 0001    Pelvic Floor Internal Exam  Patient confirms identifcation and approves PT to assess pelvic floor and treatment    Exam Type  Vaginal    Strength  fair squeeze, definite lift        [facility] Adult PT Treatment/Exercise - 03/30/19 0001      Self-Care   Self-Care  Other Self-Care Comments    Other Self-Care Comments   discussed with patient on sex therapist wot work on anxiety      Neuro Re-ed    Neuro Re-ed Details   pelvic floor contraction with breath to improve the coordination and tactile cues from  therapist index finger internally      Manual Therapy   Manual Therapy  Internal Pelvic Floor    Muscle Energy Technique  myofascial release around the cervix and right side of the lower abdomen with one finger internally and hand externally on the right lower abdomen             PT Education - 03/30/19 1359    Education Details  pelvic floor contraction in supine and sitting    Person(s) Educated  Patient    Methods  Explanation;Demonstration;Verbal cues;Handout    Comprehension  Returned demonstration;Verbalized understanding       PT Short Term Goals - 03/30/19 1539      PT SHORT TERM GOAL #3   Title  understand on standing with equal weight on bilateral extremities to decrease tone on right pelvic floor    Time  4    Period  Weeks    Status  Achieved        PT Long Term Goals - 03/30/19 1539      PT LONG TERM GOAL #1   Title  independent with advanced HEP    Time  12    Period  Weeks    Status  On-going      PT LONG TERM GOAL #2   Title  able to elongate the pelvic floor muscles to promote relaxation with penile penetration    Time  12    Period  Weeks    Status  On-going      PT LONG TERM GOAL #3   Title  pain with penile penetration </= 1-2/10 due to pelvis in correct alignment    Time  12    Period  Weeks    Status  On-going      PT LONG TERM GOAL #4   Title  left hip flexion >/= 125 degrees so she is able to sit at 90 degrees for penile penetration    Time  12    Period  Weeks    Status  On-going      PT LONG TERM GOAL #5   Title  able to have penile penetration without being anxious to prevent tightening of the tissue    Time  12  Period  Weeks    Status  On-going            Plan - 03/30/19 1532    Clinical Impression Statement  Pelvic floor strength is 3/5 for 5 seconds and needs verbal cues to incorporate the abdominals. Patient is having anterior hip pain with full flexion due to torn labrum. Patient will have the right lower  abdominal pain  when the therapist is palpatient the right side of the cervix and and anterior right pelvic floor. Patient had fascial releases with the manual work. Patient missed several weeks of therapy due to ruling out her right hip causing the right lower abdominal pain.    Personal Factors and Comorbidities  Comorbidity 1;Sex    Comorbidities  s/p laproscopic surgery    Examination-Participation Restrictions  Interpersonal Relationship    Stability/Clinical Decision Making  Evolving/Moderate complexity    Rehab Potential  Excellent    PT Frequency  1x / week    PT Duration  12 weeks    PT Treatment/Interventions  Biofeedback;Cryotherapy;Electrical Stimulation;Moist Heat;Ultrasound;Neuromuscular re-education;Therapeutic exercise;Therapeutic activities;Patient/family education;Manual techniques;Dry needling    PT Next Visit Plan  write renewal for insurance; work on right anterior area of pelvic floor internally, work on pelvic floor endurance    PT Home Exercise Plan  Access Code:    Consulted and Agree with Plan of Care  Patient       Patient will benefit from skilled therapeutic intervention in order to improve the following deficits and impairments:  Decreased coordination, Decreased range of motion, Increased fascial restricitons, Increased muscle spasms, Decreased activity tolerance, Pain, Impaired flexibility, Decreased strength  Visit Diagnosis: Cramp and spasm  Muscle weakness (generalized)  Stiffness of right hip, not elsewhere classified     Problem List Patient Active Problem List   Diagnosis Date Noted  . Symptomatic cholelithiasis 09/25/2011  . Biliary dyskinesia 09/25/2011  . Abdominal pain 08/24/2011    Eulis Foster, PT 03/30/19 3:41 PM   [facility] [address] [place], [place], [address] Phone: [phone]   Fax:  [phone]  Name: Krista Lee MRN: [id number] Date of Birth:  Aug 08, 1985

## 2019-04-20 ENCOUNTER — Encounter: Payer: Self-pay | Admitting: Physical Therapy

## 2019-04-20 ENCOUNTER — Other Ambulatory Visit: Payer: Self-pay

## 2019-04-20 ENCOUNTER — Ambulatory Visit: Payer: 59 | Attending: Advanced Practice Midwife | Admitting: Physical Therapy

## 2019-04-20 DIAGNOSIS — R252 Cramp and spasm: Secondary | ICD-10-CM

## 2019-04-20 DIAGNOSIS — M6281 Muscle weakness (generalized): Secondary | ICD-10-CM | POA: Insufficient documentation

## 2019-04-20 DIAGNOSIS — M25651 Stiffness of right hip, not elsewhere classified: Secondary | ICD-10-CM | POA: Diagnosis present

## 2019-04-20 NOTE — Patient Instructions (Signed)
Access Code: URL: [email or web address] Date: 04/20/2019 Prepared by: Eulis Foster  Exercises Supine Butterfly Groin Stretch - 1 x daily - 7 x weekly - 1 reps - 1 sets - 1-2 min hold Pilates Bridge - 1 x daily - 7 x weekly - 10 reps - 1 sets - 5 sec hold Supine Pelvic Floor Stretch - 1 x daily - 7 x weekly - 1 reps - 1 sets - 1-2 min hold Cat-Camel - 1 x daily - 7 x weekly - 10 reps - 1 sets Deep Squat with Pelvic Floor Relaxation - 1 x daily - 7 x weekly - 1 sets - 1 reps - 30 sec hold [facility] [address] [place], [place] [address] Phone # [phone] Fax [phone]

## 2019-04-20 NOTE — Therapy (Signed)
[facility] [address] [place], [place], [address] Phone: [phone]   Fax:  [phone]  Physical Therapy Treatment  Patient Details  Name: Krista Lee MRN: [id number] Date of Birth: [birth year] Referring Provider (PT): Ulis Rias, PT   Encounter Date: 04/20/2019  PT End of Session - 04/20/19 1359    Visit Number  5    Date for PT Re-Evaluation  10/20/19    Authorization Type  BCBS    PT Start Time  1400    PT Stop Time  1500    PT Time Calculation (min)  60 min    Activity Tolerance  Patient tolerated treatment well    Behavior During Therapy  [facility] for tasks assessed/performed       Past Medical History:  Diagnosis Date  . ADD (attention deficit disorder)   . Allergy    no meds  . Anemia    as adolescent  . Anxiety   . Depression   . GERD (gastroesophageal reflux disease)    doesn't take any meds for this, diet controlled  . Headache(784.0)   . Heart murmur    as a child - no problems  . History of bronchitis    as a teenager, no problems as adult  . History of migraine    can't remember when the last one was  . Insomnia    no meds  . Panic attacks     Past Surgical History:  Procedure Laterality Date  . CHOLECYSTECTOMY  10/22/2011   Procedure: LAPAROSCOPIC CHOLECYSTECTOMY;  Surgeon: Shelly Rubenstein, MD;  Location: [facility] OR;  Service: General;  Laterality: N/A;  . HYSTEROSCOPY WITH RESECTOSCOPE N/A 04/28/2013   Procedure:  HYSTEROSCOPY ;  Surgeon: Genia Del, MD;  Location: [facility];  Service: Gynecology;  Laterality: N/A;  . LAPAROSCOPY N/A 04/28/2013   Procedure: LAPAROSCOPY DIAGNOSTIC;  Surgeon: Genia Del, MD;  Location: [facility];  Service: Gynecology;  Laterality: N/A;  2 hrs. total  . WISDOM TOOTH EXTRACTION  50yrs ago    There were no vitals filed for this visit.  Subjective Assessment - 04/20/19 1401    Subjective  I have been doing the exercises and have been  cramping. I do the exercises 1-2 times per day. I get intense cramps after I orgasm for 30 minutes. Last time I had intercourse I had pain in the lower abdomen. The pain will get better if I tense up .    Patient Stated Goals  reduce pain with intercourse    Currently in Pain?  Yes    Pain Score  7     Pain Location  Pelvis    Pain Orientation  Lower    Pain Descriptors / Indicators  Sharp;Stabbing;Aching    Pain Type  Chronic pain    Pain Onset  More than a month ago    Pain Frequency  Intermittent    Aggravating Factors   penile penetration when in a seated style position, her on top seated, supine with legs up; legs at 90 degree hip flexion    Pain Relieving Factors  change position    Multiple Pain Sites  No         [facility] PT Assessment - 04/20/19 0001      Assessment   Medical Diagnosis  R10.2, G89.29 Chronic pelvic pain in female    Referring Provider (PT)  Ulis Rias, PT    Prior Therapy  none      Precautions  Precautions  Other (comment)    Precaution Comments  trying to conceive      Restrictions   Weight Bearing Restrictions  No      Prior Function   Level of Independence  Independent    Vocation  Full time employment    Vocation Requirements  standing    Leisure  yoga      Cognition   Overall Cognitive Status  Within Functional Limits for tasks assessed      Posture/Postural Control   Posture/Postural Control  No significant limitations      ROM / Strength   AROM / PROM / Strength  AROM;PROM;Strength      PROM   Right Hip Flexion  130    Right Hip External Rotation   85    Left Hip Flexion  125    Left Hip External Rotation   65      Strength   Right Hip External Rotation   4/5    Right Hip ABduction  4/5    Left Hip External Rotation  4/5    Left Hip ABduction  4/5      Palpation   Palpation comment  anteriorly rotated right ilium                Pelvic Floor Special Questions - 04/20/19 0001    Currently Sexually Active   Yes    Is this Painful  Yes    Marinoff Scale  pain interrupts completion    Pelvic Floor Internal Exam  Patient confirms identifcation and approves PT to assess pelvic floor and treatment    Exam Type  Vaginal    Palpation  tightness in the levator ani and obturator internist    Strength  fair squeeze, definite lift        [facility] Adult PT Treatment/Exercise - 04/20/19 0001      Neuro Re-ed    Neuro Re-ed Details   using a mirror to know how to bulge the pelvic floor posteriorly and anteriorly, education on body scan to release the tension in the body to relax the pelvic floor, deep squat to stretch and release the pelvic floor, ways to lay with feet on wall and on leg on the stool to release the pelvic floor      Manual Therapy   Manual Therapy  Joint mobilization;Internal Pelvic Floor    Joint Mobilization  mobilize the sacrum for right rotation and extension; T4-L5 PA and rotational mobiliztion    Internal Pelvic Floor  fascial release of the levator ani, obturator internist, release with one hand on the lower abdomenal and other on the pelvic floor muscles             PT Education - 04/20/19 1508    Education Details  Access Code: ; way to relax the pelvic floor, body scan for tension    Person(s) Educated  Patient    Methods  Explanation;Demonstration;Verbal cues;Handout    Comprehension  Verbalized understanding;Returned demonstration       PT Short Term Goals - 03/30/19 1539      PT SHORT TERM GOAL #3   Title  understand on standing with equal weight on bilateral extremities to decrease tone on right pelvic floor    Time  4    Period  Weeks    Status  Achieved        PT Long Term Goals - 04/20/19 1513      PT LONG TERM GOAL #1  Title  independent with advanced HEP    Time  12    Period  Weeks    Status  On-going      PT LONG TERM GOAL #2   Title  able to elongate the pelvic floor muscles to promote relaxation with penile penetration    Time  12     Period  Weeks    Status  On-going      PT LONG TERM GOAL #3   Title  pain with penile penetration </= 1-2/10 due to pelvis in correct alignment    Time  12    Period  Weeks    Status  On-going      PT LONG TERM GOAL #4   Title  left hip flexion >/= 125 degrees so she is able to sit at 90 degrees for penile penetration    Time  12    Period  Weeks    Status  Achieved      PT LONG TERM GOAL #5   Title  able to have penile penetration without being anxious to prevent tightening of the tissue    Time  12    Period  Weeks    Status  On-going            Plan - 04/20/19 1455    Clinical Impression Statement  Patient continues to have right lower abdominal pain with penile penetration when she is at 90 degrees of hip flexion. Patient was trying to strengthening her pelvic floor but caused increased cramping due to her pelvic floor is too tight and not able to relax. Patient is able to sit and relax the anterior and posteriorl portion of the pelvic floor now after using a mirror to see the vaginal area open up. Patient has tenderness and tightness in bilateral obturator internist and levator ani. Patient will have pain vaginally after she orgasms. Patient pelvis in correct alignment but has tightness in the left lumbar facets. Patient will benefit from skilled therapy to release and elongate pelvic floor tissue.    Personal Factors and Comorbidities  Comorbidity 1;Sex    Comorbidities  s/p laproscopic surgery    Examination-Participation Restrictions  Interpersonal Relationship    Stability/Clinical Decision Making  Evolving/Moderate complexity    Rehab Potential  Excellent    PT Frequency  1x / week    PT Duration  12 weeks    PT Treatment/Interventions  Biofeedback;Cryotherapy;Electrical Stimulation;Moist Heat;Ultrasound;Neuromuscular re-education;Therapeutic exercise;Therapeutic activities;Patient/family education;Manual techniques;Dry needling    PT Next Visit Plan  work on  elongation of pelvic floor tissue, send patient information on jaw exercises    PT Home Exercise Plan  Access Code:    Recommended Other Services  sent MD renewal on 04/20/2019    Consulted and Agree with Plan of Care  Patient       Patient will benefit from skilled therapeutic intervention in order to improve the following deficits and impairments:  Decreased coordination, Decreased range of motion, Increased fascial restricitons, Increased muscle spasms, Decreased activity tolerance, Pain, Impaired flexibility, Decreased strength  Visit Diagnosis: Cramp and spasm - Plan: PT plan of care cert/re-cert  Muscle weakness (generalized) - Plan: PT plan of care cert/re-cert  Stiffness of right hip, not elsewhere classified - Plan: PT plan of care cert/re-cert     Problem List Patient Active Problem List   Diagnosis Date Noted  . Symptomatic cholelithiasis 09/25/2011  . Biliary dyskinesia 09/25/2011  . Abdominal pain 08/24/2011    Eulis Foster, PT  04/20/19 3:21 PM   [facility] [address] [place], [place], [address] Phone: [phone]   Fax:  [phone]  Name: Krista Lee MRN: [id number] Date of Birth: [birth year]

## 2019-05-18 ENCOUNTER — Encounter: Payer: 59 | Admitting: Physical Therapy

## 2019-05-25 ENCOUNTER — Encounter: Payer: Self-pay | Admitting: Emergency Medicine

## 2019-05-25 ENCOUNTER — Other Ambulatory Visit: Payer: Self-pay

## 2019-05-25 ENCOUNTER — Emergency Department (INDEPENDENT_AMBULATORY_CARE_PROVIDER_SITE_OTHER)
Admission: EM | Admit: 2019-05-25 | Discharge: 2019-05-25 | Disposition: A | Discharge status: Home / Self Care | Payer: 59 | Source: Home / Self Care

## 2019-05-25 DIAGNOSIS — Z299 Encounter for prophylactic measures, unspecified: Secondary | ICD-10-CM

## 2019-05-25 DIAGNOSIS — Z23 Encounter for immunization: Secondary | ICD-10-CM

## 2019-05-25 MED ORDER — TETANUS-DIPHTH-ACELL PERTUSSIS 5-2.5-18.5 LF-MCG/0.5 IM SUSP
0.5000 mL | Freq: Once | INTRAMUSCULAR | Status: AC
  Administered 2019-05-25: 0.5 mL via INTRAMUSCULAR

## 2019-05-25 NOTE — ED Triage Notes (Signed)
TDAP

## 2019-06-15 ENCOUNTER — Encounter: Payer: Self-pay | Admitting: Physical Therapy

## 2019-06-15 ENCOUNTER — Ambulatory Visit: Payer: 59 | Attending: Advanced Practice Midwife | Admitting: Physical Therapy

## 2019-06-15 ENCOUNTER — Other Ambulatory Visit: Payer: Self-pay

## 2019-06-15 DIAGNOSIS — M25651 Stiffness of right hip, not elsewhere classified: Secondary | ICD-10-CM | POA: Insufficient documentation

## 2019-06-15 DIAGNOSIS — M6281 Muscle weakness (generalized): Secondary | ICD-10-CM | POA: Insufficient documentation

## 2019-06-15 DIAGNOSIS — R252 Cramp and spasm: Secondary | ICD-10-CM | POA: Diagnosis not present

## 2019-06-15 NOTE — Therapy (Signed)
[facility] [address] [place], [place], [address] Phone: [phone]   Fax:  [phone]  Physical Therapy Treatment  Patient Details  Name: Krista Lee MRN: [id number] Date of Birth: 1985/07/14 Referring Provider (PT): Ulis Rias, PT   Encounter Date: 06/15/2019   PT End of Session - 06/15/19 1221    Visit Number 6    Date for PT Re-Evaluation 10/20/19    Authorization Type BCBS    PT Start Time 1100    PT Stop Time 1145    PT Time Calculation (min) 45 min    Activity Tolerance Patient tolerated treatment well    Behavior During Therapy [facility] for tasks assessed/performed           Past Medical History:  Diagnosis Date  . ADD (attention deficit disorder)   . Allergy    no meds  . Anemia    as adolescent  . Anxiety   . Depression   . GERD (gastroesophageal reflux disease)    doesn't take any meds for this, diet controlled  . Headache(784.0)   . Heart murmur    as a child - no problems  . History of bronchitis    as a teenager, no problems as adult  . History of migraine    can't remember when the last one was  . Insomnia    no meds  . Panic attacks     Past Surgical History:  Procedure Laterality Date  . CHOLECYSTECTOMY  10/22/2011   Procedure: LAPAROSCOPIC CHOLECYSTECTOMY;  Surgeon: Shelly Rubenstein, MD;  Location: [facility] OR;  Service: General;  Laterality: N/A;  . HYSTEROSCOPY WITH RESECTOSCOPE N/A 04/28/2013   Procedure:  HYSTEROSCOPY ;  Surgeon: Genia Del, MD;  Location: [facility];  Service: Gynecology;  Laterality: N/A;  . LAPAROSCOPY N/A 04/28/2013   Procedure: LAPAROSCOPY DIAGNOSTIC;  Surgeon: Genia Del, MD;  Location: [facility];  Service: Gynecology;  Laterality: N/A;  2 hrs. total  . WISDOM TOOTH EXTRACTION  54yrs ago    There were no vitals filed for this visit.   Subjective Assessment - 06/15/19 1103    Subjective During my cycle I get a cramp with pulling  feeling on the suprapubic area. I have not had intercourse due to the cramping. I get pain where the right ovary is. The sensation of arousal is lower. I had trouble relaxing my pelvic floor. Now I am doing a better job with relaxing my pelvic floor. I am squating sevral times per day and yoga to relax my pelvic floor. Pain started when I went to the beach.    Patient Stated Goals reduce pain with intercourse    Currently in Pain? Yes    Pain Score 5     Pain Location Suprapubic    Pain Orientation Lower    Pain Descriptors / Indicators Dull    Pain Type Chronic pain    Pain Onset More than a month ago    Pain Frequency Intermittent    Aggravating Factors  penile penetration when in a seated style position, her on tap seated, supine with legs  up, legs at 90 degree hip flexion    Pain Relieving Factors chane position                             [facility] Adult PT Treatment/Exercise - 06/15/19 0001      Self-Care   Self-Care Other Self-Care Comments  Other Self-Care Comments  discussed the anatomy of the pelvis and intestines, went over her diet changes,      Manual Therapy   Manual Therapy Myofascial release    Myofascial Release fascial release                  PT Education - 06/15/19 1221    Education Details education on massaging the suprapubic area to release the fascia    Person(s) Educated Patient    Methods Explanation;Demonstration    Comprehension Verbalized understanding;Returned demonstration            PT Short Term Goals - 03/30/19 1539      PT SHORT TERM GOAL #3   Title understand on standing with equal weight on bilateral extremities to decrease tone on right pelvic floor    Time 4    Period Weeks    Status Achieved             PT Long Term Goals - 06/15/19 1225      PT LONG TERM GOAL #1   Title independent with advanced HEP    Time 12    Period Weeks    Status On-going      PT LONG TERM GOAL #2   Title able to  elongate the pelvic floor muscles to promote relaxation with penile penetration    Period Weeks    Status On-going      PT LONG TERM GOAL #3   Title pain with penile penetration </= 1-2/10 due to pelvis in correct alignment    Time 12    Period Weeks    Status On-going      PT LONG TERM GOAL #4   Title left hip flexion >/= 125 degrees so she is able to sit at 90 degrees for penile penetration    Time 12    Period Weeks    Status Achieved      PT LONG TERM GOAL #5   Title able to have penile penetration without being anxious to prevent tightening of the tissue    Time 12    Period Weeks    Status On-going                 Plan - 06/15/19 1221    Clinical Impression Statement Patient has fascial tightnes in the lower suprapubic area that may contribute to the cramping she is experiencing. Patient has not had intercourse at this time  so she is not sure she is having pain. Patinet reports she has low feeling when masturbating. Patient has limited right hip flexion due to the labral tear. Patient will benefit from skilled therapy to release and elongate the pelvic floor tissue.    Personal Factors and Comorbidities Comorbidity 1;Sex    Comorbidities s/p laproscopic surgery    Examination-Participation Restrictions Interpersonal Relationship    Stability/Clinical Decision Making Evolving/Moderate complexity    Rehab Potential Excellent    PT Frequency 1x / week    PT Duration 12 weeks    PT Treatment/Interventions Biofeedback;Cryotherapy;Electrical Stimulation;Moist Heat;Ultrasound;Neuromuscular re-education;Therapeutic exercise;Therapeutic activities;Patient/family education;Manual techniques;Dry needling    PT Next Visit Plan jaw exercises; elongation of the pelvic floor tissue, fascial work for lower abdomen    PT Home Exercise Plan Access Code: [email or web address]    Consulted and Agree with Plan of Care Patient           Patient will benefit from skilled therapeutic intervention  in order to improve the following deficits and impairments:  Decreased coordination, Decreased range of motion, Increased fascial restricitons, Increased muscle spasms, Decreased activity tolerance, Pain, Impaired flexibility, Decreased strength  Visit Diagnosis: Cramp and spasm  Muscle weakness (generalized)  Stiffness of right hip, not elsewhere classified     Problem List Patient Active Problem List   Diagnosis Date Noted  . Symptomatic cholelithiasis 09/25/2011  . Biliary dyskinesia 09/25/2011  . Abdominal pain 08/24/2011    Eulis Foster, PT 06/15/19 12:26 PM   [facility] [address] [place], [place], [address] Phone: [phone]   Fax:  [phone]  Name: Krista Lee MRN: [id number] Date of Birth: [birth year]

## 2019-07-13 ENCOUNTER — Other Ambulatory Visit: Payer: Self-pay

## 2019-07-13 ENCOUNTER — Ambulatory Visit: Payer: 59 | Attending: Advanced Practice Midwife | Admitting: Physical Therapy

## 2019-07-13 ENCOUNTER — Encounter: Payer: Self-pay | Admitting: Physical Therapy

## 2019-07-13 DIAGNOSIS — M25651 Stiffness of right hip, not elsewhere classified: Secondary | ICD-10-CM | POA: Insufficient documentation

## 2019-07-13 DIAGNOSIS — R252 Cramp and spasm: Secondary | ICD-10-CM

## 2019-07-13 DIAGNOSIS — M6281 Muscle weakness (generalized): Secondary | ICD-10-CM | POA: Diagnosis present

## 2019-07-13 NOTE — Therapy (Signed)
[facility] [address] [place], [place], [address] Phone: [phone]   Fax:  [phone]  Physical Therapy Treatment  Patient Details  Name: Krista Lee MRN: [id number] Date of Birth: 1985-05-04 Referring Provider (PT): Ulis Rias, PT   Encounter Date: 07/13/2019   PT End of Session - 07/13/19 1111    Visit Number 7    Date for PT Re-Evaluation 10/20/19    Authorization Type BCBS    PT Start Time 1100    PT Stop Time 1142    PT Time Calculation (min) 42 min    Activity Tolerance Patient tolerated treatment well    Behavior During Therapy [facility] for tasks assessed/performed           Past Medical History:  Diagnosis Date  . ADD (attention deficit disorder)   . Allergy    no meds  . Anemia    as adolescent  . Anxiety   . Depression   . GERD (gastroesophageal reflux disease)    doesn't take any meds for this, diet controlled  . Headache(784.0)   . Heart murmur    as a child - no problems  . History of bronchitis    as a teenager, no problems as adult  . History of migraine    can't remember when the last one was  . Insomnia    no meds  . Panic attacks     Past Surgical History:  Procedure Laterality Date  . CHOLECYSTECTOMY  10/22/2011   Procedure: LAPAROSCOPIC CHOLECYSTECTOMY;  Surgeon: Shelly Rubenstein, MD;  Location: [facility] OR;  Service: General;  Laterality: N/A;  . HYSTEROSCOPY WITH RESECTOSCOPE N/A 04/28/2013   Procedure:  HYSTEROSCOPY ;  Surgeon: Genia Del, MD;  Location: [facility];  Service: Gynecology;  Laterality: N/A;  . LAPAROSCOPY N/A 04/28/2013   Procedure: LAPAROSCOPY DIAGNOSTIC;  Surgeon: Genia Del, MD;  Location: [facility];  Service: Gynecology;  Laterality: N/A;  2 hrs. total  . WISDOM TOOTH EXTRACTION  65yrs ago    There were no vitals filed for this visit.   Subjective Assessment - 07/13/19 1105    Subjective I have had some cramping. I was on vacation last  week so I was relaxed. I have done some squats. I feel like things are improved with the accupuncture and PT. When on back with intercourse I have not had pain. Sitting on top with intercourse is less pain.    Patient Stated Goals reduce pain with intercourse    Currently in Pain? Yes    Pain Score 5     Pain Location Suprapubic    Pain Orientation Lower    Pain Descriptors / Indicators Dull    Pain Type Chronic pain    Pain Onset More than a month ago    Pain Frequency Intermittent    Aggravating Factors  penile penetration with her on top    Pain Relieving Factors change position    Multiple Pain Sites No                             [facility] Adult PT Treatment/Exercise - 07/13/19 0001      Manual Therapy   Manual Therapy Myofascial release    Manual therapy comments instructed patient on how to do fascial release on herself    Myofascial Release fascial release of the lower abdomen, around the umbilicus, and the right lower abdomen to go through the  layers                  PT Education - 07/13/19 1147    Education Details education on how to perform fascial release of the abdomen on her own    Person(s) Educated Patient    Methods Explanation;Demonstration    Comprehension Verbalized understanding;Returned demonstration            PT Short Term Goals - 03/30/19 1539      PT SHORT TERM GOAL #3   Title understand on standing with equal weight on bilateral extremities to decrease tone on right pelvic floor    Time 4    Period Weeks    Status Achieved             PT Long Term Goals - 07/13/19 1242      PT LONG TERM GOAL #1   Title independent with advanced HEP    Time 12    Period Weeks    Status On-going      PT LONG TERM GOAL #2   Title able to elongate the pelvic floor muscles to promote relaxation with penile penetration    Time 12    Period Weeks    Status On-going      PT LONG TERM GOAL #3   Title pain with penile penetration  </= 1-2/10 due to pelvis in correct alignment    Time 12    Period Weeks    Status On-going      PT LONG TERM GOAL #4   Title left hip flexion >/= 125 degrees so she is able to sit at 90 degrees for penile penetration    Time 12    Period Weeks    Status Achieved      PT LONG TERM GOAL #5   Title able to have penile penetration without being anxious to prevent tightening of the tissue    Time 12    Period Weeks    Status Achieved                 Plan - 07/13/19 1111    Clinical Impression Statement Patient is having accupuncture too. Patient feels the accupuncture and physical therapy is helping her. She is able to have intercourse with less pain intensity and does not have to stop in the middle. Patient is doing her stretches. Today patient had increased fascial tightness in the abdomen.  Patient will benefit from skilled therapy to release and elongate the pelvic floor tissue.    Personal Factors and Comorbidities Comorbidity 1;Sex    Comorbidities s/p laproscopic surgery    Examination-Participation Restrictions Interpersonal Relationship    Stability/Clinical Decision Making Evolving/Moderate complexity    Rehab Potential Excellent    PT Frequency 1x / week    PT Duration 12 weeks    PT Treatment/Interventions Biofeedback;Cryotherapy;Electrical Stimulation;Moist Heat;Ultrasound;Neuromuscular re-education;Therapeutic exercise;Therapeutic activities;Patient/family education;Manual techniques;Dry needling    PT Next Visit Plan jaw exercises; elongation of the pelvic floor tissue, fascial work for lower abdomen    PT Home Exercise Plan Access Code:    Consulted and Agree with Plan of Care Patient           Patient will benefit from skilled therapeutic intervention in order to improve the following deficits and impairments:  Decreased coordination, Decreased range of motion, Increased fascial restricitons, Increased muscle spasms, Decreased activity tolerance,  Pain, Impaired flexibility, Decreased strength  Visit Diagnosis: Cramp and spasm  Muscle weakness (generalized)  Stiffness of right  hip, not elsewhere classified     Problem List Patient Active Problem List   Diagnosis Date Noted  . Symptomatic cholelithiasis 09/25/2011  . Biliary dyskinesia 09/25/2011  . Abdominal pain 08/24/2011    Eulis Foster, PT 07/13/19 12:42 PM   [facility] [address] [place], [place], [address] Phone: [phone]   Fax:  [phone]  Name: Krista Lee MRN: [id number] Date of Birth: 1985/06/11

## 2019-08-13 ENCOUNTER — Encounter: Payer: Self-pay | Admitting: Physical Therapy

## 2019-08-17 ENCOUNTER — Ambulatory Visit (INDEPENDENT_AMBULATORY_CARE_PROVIDER_SITE_OTHER): Payer: 59 | Admitting: Advanced Practice Midwife

## 2019-08-17 ENCOUNTER — Other Ambulatory Visit: Payer: Self-pay

## 2019-08-17 ENCOUNTER — Encounter: Payer: Self-pay | Admitting: Advanced Practice Midwife

## 2019-08-17 VITALS — BP 110/68 | HR 92 | Ht 66.2 in | Wt 122.8 lb

## 2019-08-17 DIAGNOSIS — R6882 Decreased libido: Secondary | ICD-10-CM

## 2019-08-17 DIAGNOSIS — N979 Female infertility, unspecified: Secondary | ICD-10-CM | POA: Diagnosis not present

## 2019-08-17 MED ORDER — LETROZOLE 2.5 MG PO TABS: ORAL_TABLET | ORAL | 2 refills | Status: DC

## 2019-08-17 NOTE — Progress Notes (Signed)
  GYNECOLOGY PROGRESS NOTE  History:  34 y.o. G0P0000 presents to [place] office today for problem gyn visit. She reports regular menses x 6-8 months with 25-29 day cycles and ovulation kit without LH surge until the last 2 months.  Last month she had LH surge per ovulation kit but no ovulatory pain, which she has had in the past, and did not conceive. This month she saw an [facility] again 8 days ago, so is too early to test for pregnancy by UPT and is waiting.  She kept the appointment because she reports some decreased libido the last 2 months, and to see if there is any further testing/management needed to become pregnant.  She has PT for pelvic pain which is helping, and her therapist recommended discussing labwork with the OB/Gyn due to her low libido and infertility.  She denies h/a, dizziness, shortness of breath, n/v, or fever/chills.    The following portions of the patient's history were reviewed and updated as appropriate: allergies, current medications, past family history, past medical history, past social history, past surgical history and problem list. Last pap smear on 01/12/19 was normal, negative HRHPV.  Review of Systems:  Pertinent items are noted in HPI.   Objective:  Physical Exam Blood pressure 110/68, pulse 92, height 5' 6.2" (1.681 m), weight 122 lb 12.8 oz (55.7 kg), last menstrual period 07/26/2019. 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  Given regular menstrual cycles, no labwork is needed.  Start Femara low dose for 5 day course on pt next cycle, Day 3 to Day 7 to stimulate ovulation then resume timing of intercourse  midcycle as usual.  Refill x 2 for pt to resume with next cycle if pregnancy not achieved.  --After 3 month course of 2.5 mg, increase dose to 5 mg daily x 5 days timed with pt cycle x 3 months.  --If pregnancy not achieved, refer to fertility specialist  - letrozole (Percival) 2.5 MG tablet; Take one 2.5 mg tab daily from Day 3 to Day 7 on your menstrual cycle, with Day 1 being the first day of your menstrual period.  Dispense: 5 tablet; Refill: 2   2. Decreased libido --x 2 months, may be related to pt recent acupuncture, vitamin changes, or stress/anxiety.   --F/U in 3 months regarding fertility medication and to address any ongoing libido issues.  Fatima Blank, CNM 3:54 PM

## 2019-08-17 NOTE — Patient Instructions (Signed)
Female Infertility  Female infertility refers to a woman's inability to get pregnant (conceive) after a year of having sex regularly (or after 6 months in women over age 32) without using birth control. Infertility can also mean that a woman is not able to carry a pregnancy to full term. Both women and men can have fertility problems. What are the causes? This condition may be caused by:  Problems with reproductive organs. Infertility can result if a woman: ? Has an abnormally short cervix or a cervix that does not remain closed during a pregnancy. ? Has a blockage or scarring in the fallopian tubes. ? Has an abnormally shaped uterus. ? Has uterine fibroids. This is a benign mass of tissue or muscle (tumor) that can develop in the uterus. ? Is not ovulating in a regular way.  Certain medical conditions. These may include: ? Polycystic ovary syndrome (PCOS). This is a hormonal disorder that can cause small cysts to grow on the ovaries. This is the most common cause of infertility in women. ? Endometriosis. This is a condition in which the tissue that lines the uterus (endometrium) grows outside of its normal location. ? Cancer and cancer treatments, such as chemotherapy or radiation. ? Premature ovarian failure. This is when ovaries stop producing eggs and hormones before age 40. ? Sexually transmitted diseases, such as chlamydia or gonorrhea. ? Autoimmune disorders. These are disorders in which the body's defense system (immune system) attacks normal, healthy cells. Infertility can be linked to more than one cause. For some women, the cause of infertility is not known (unexplained infertility). What increases the risk?  Age. A woman's fertility declines with age, especially after her mid-30s.  Being underweight or overweight.  Drinking too much alcohol.  Using drugs such as anabolic steroids, cocaine, and marijuana.  Exercising excessively.  Being exposed to environmental toxins,  such as radiation, pesticides, and certain chemicals. What are the signs or symptoms? The main sign of infertility in women is the inability to get pregnant or carry a pregnancy to full term. How is this diagnosed? This condition may be diagnosed by:  Checking whether you are ovulating each month. The tests may include: ? Blood tests to check hormone levels. ? An ultrasound of the ovaries. ? Taking a small tissue that lines the uterus and checking it under a microscope (endometrial biopsy).  Doing additional tests. This is done if ovulation is normal. Tests may include: ? Hysterosalpingography. This X-ray test can show the shape of the uterus and whether the fallopian tubes are open. ? Laparoscopy. This test uses a lighted tube (laparoscope) to look for problems in the fallopian tubes and other organs. ? Transvaginal ultrasound. This imaging test is used to check for abnormalities in the uterus and ovaries. ? Hysteroscopy. This test uses a lighted tube to check for problems in the cervix and the uterus. To be diagnosed with infertility, both partners will have a physical exam. Both partners will also have an extensive medical and sexual history taken. Additional tests may be done. How is this treated? Treatment depends on the cause of infertility. Most cases of infertility in women are treated with medicine or surgery.  Women may take medicine to: ? Correct ovulation problems. ? Treat other health conditions.  Surgery may be done to: ? Repair damage to the ovaries, fallopian tubes, cervix, or uterus. ? Remove growths from the uterus. ? Remove scar tissue from the uterus, pelvis, or other organs. Assisted reproductive technology (ART) Assisted reproductive technology (  ART) refers to all treatments and procedures that combine eggs and sperm outside the body to try to help a couple conceive. ART is often combined with fertility drugs to stimulate ovulation. Sometimes ART is done using eggs  retrieved from another woman's body (donor eggs) or from previously frozen fertilized eggs (embryos). There are different types of ART. These include:  Intrauterine insemination (IUI). A long, thin tube is used to place sperm directly into a woman's uterus. This procedure: ? Is effective for infertility caused by sperm problems, including low sperm count and low motility. ? Can be used in combination with fertility drugs.  In vitro fertilization (IVF). This is done when a woman's fallopian tubes are blocked or when a man has low sperm count. In this procedure: ? Fertility drugs are used to stimulate the ovaries to produce multiple eggs. ? Once mature, these eggs are removed from the body and combined with the sperm to be fertilized. ? The fertilized eggs are then placed into the woman's uterus. Follow these instructions at home:  Take over-the-counter and prescription medicines only as told by your health care provider.  Do not use any products that contain nicotine or tobacco, such as cigarettes and e-cigarettes. If you need help quitting, ask your health care provider.  If you drink alcohol, limit how much you have to 1 drink a day.  Make dietary changes to lose weight or maintain a healthy weight. Work with your health care provider and a dietitian to set a weight-loss goal that is healthy and reasonable for you.  Seek support from a counselor or support group to talk about your concerns related to infertility. Couples counseling may be helpful for you and your partner.  Practice stress reduction techniques that work well for you, such as regular physical activity, meditation, or deep breathing.  Keep all follow-up visits as told by your health care provider. This is important. Contact a health care provider if you:  Feel that stress is interfering with your life and relationships.  Have side effects from treatments for infertility. Summary  Female infertility refers to a woman's  inability to get pregnant (conceive) after a year of having sex regularly (or after 6 months in women over age 32) without using birth control.  To be diagnosed with infertility, both partners will have a physical exam. Both partners will also have an extensive medical and sexual history taken.  Seek support from a counselor or support group to talk about your concerns related to infertility. Couples counseling may be helpful for you and your partner. This information is not intended to replace advice given to you by your health care provider. Make sure you discuss any questions you have with your health care provider. Document Revised: 04/10/2018 Document Reviewed: 11/19/2016 Elsevier Patient Education  2020 Elsevier Inc.  

## 2019-08-17 NOTE — Progress Notes (Signed)
Pt is in the office to discuss ovulation. LMP 07-26-19 Pt states that she has been trying to conceive for 8 months.

## 2019-09-14 ENCOUNTER — Ambulatory Visit: Payer: 59 | Attending: Advanced Practice Midwife | Admitting: Physical Therapy

## 2019-09-14 ENCOUNTER — Other Ambulatory Visit: Payer: Self-pay

## 2019-09-14 ENCOUNTER — Encounter: Payer: Self-pay | Admitting: Physical Therapy

## 2019-09-14 DIAGNOSIS — M25651 Stiffness of right hip, not elsewhere classified: Secondary | ICD-10-CM | POA: Diagnosis present

## 2019-09-14 DIAGNOSIS — R252 Cramp and spasm: Secondary | ICD-10-CM | POA: Diagnosis not present

## 2019-09-14 DIAGNOSIS — M6281 Muscle weakness (generalized): Secondary | ICD-10-CM | POA: Diagnosis present

## 2019-09-14 NOTE — Therapy (Signed)
[facility] [address], [address] [place], [place], [address] Phone: [phone]   Fax:  [phone]  Physical Therapy Treatment  Patient Details  Name: CALVARY DIFRANCO MRN: [id number] Date of Birth: Sep 23, 1985 Referring Provider (PT): Ulis Rias, PT   Encounter Date: 09/14/2019   PT End of Session - 09/14/19 1102    Visit Number 8    Date for PT Re-Evaluation 10/20/19    Authorization Type BC[employer]    PT Start Time 1102    PT Stop Time 1142    PT Time Calculation (min) 40 min    Activity Tolerance Patient tolerated treatment well    Behavior During Therapy [facility] for tasks assessed/performed           Past Medical History:  Diagnosis Date  . ADD (attention deficit disorder)   . Allergy    no meds  . Anemia    as adolescent  . Anxiety   . Depression   . GERD (gastroesophageal reflux disease)    doesn't take any meds for this, diet controlled  . Headache(784.0)   . Heart murmur    as a child - no problems  . History of bronchitis    as a teenager, no problems as adult  . History of migraine    can't remember when the last one was  . Insomnia    no meds  . Panic attacks     Past Surgical History:  Procedure Laterality Date  . CHOLECYSTECTOMY  10/22/2011   Procedure: LAPAROSCOPIC CHOLECYSTECTOMY;  Surgeon: Shelly Rubenstein, MD;  Location: [facility] OR;  Service: General;  Laterality: N/A;  . HYSTEROSCOPY WITH RESECTOSCOPE N/A 04/28/2013   Procedure:  HYSTEROSCOPY ;  Surgeon: Genia Del, MD;  Location: [facility] OR[facility];  Service: Gynecology;  Laterality: N/A;  . LAPAROSCOPY N/A 04/28/2013   Procedure: LAPAROSCOPY DIAGNOSTIC;  Surgeon: Genia Del, MD;  Location: [facility];  Service: Gynecology;  Laterality: N/A;  2 hrs. total  . WISDOM TOOTH EXTRACTION  35yrs ago    There were no vitals filed for this visit.   Subjective Assessment - 09/14/19 1104    Subjective The small vibration spasm in the front of the  pelvis lasted for several days. The pain came back 1 month ago. 2 weeks ago was my fertile window. When picture the pelvic floor bulging it would help.  I have the pain with intercourse. Posibility to be pregnant. Ihave some kneck discomfort and with arms down is uncomfrotable.    Patient Stated Goals reduce pain with intercourse    Currently in Pain? Yes    Pain Score 5     Pain Location Suprapubic    Pain Orientation Lower    Pain Descriptors / Indicators Dull    Pain Type Chronic pain    Pain Onset More than a month ago    Pain Frequency Intermittent    Aggravating Factors  penile penetration with her on top or from behind    Pain Relieving Factors change position    Multiple Pain Sites No                             [facility] Adult PT Treatment/Exercise - 09/14/19 0001      Posture/Postural Control   Posture Comments education on sitting postures to reduce pelvic floor tightness with sitting and knees flexed against her chest, standing with forward head posture and tightening of the pelvic floor, sleeping  with flexed posture that will tighten the pelvic floor      Lumbar Exercises: Stretches   Other Lumbar Stretch Exercise doorway stretch hold 30 sec.     Other Lumbar Stretch Exercise butterfly stretch with pillow under knees and expand the pelvic floor      Lumbar Exercises: Supine   Other Supine Lumbar Exercises supine with legs up wall and perfroming breathing with elongation o fthe pelvic floor ans therapist elongates the cervical and perfoms soft tissue work to the cervical musculatiure       Lumbar Exercises: Quadruped   Opposite Arm/Leg Raise Right arm/Left leg;Left arm/Right leg;10 reps;1 second                  PT Education - 09/14/19 1148    Education Details Access Code: ; education on posture    Person(s) Educated Patient    Methods Explanation;Demonstration;Verbal cues;Handout    Comprehension Returned demonstration;Verbalized  understanding            PT Short Term Goals - 03/30/19 1539      PT SHORT TERM GOAL #3   Title understand on standing with equal weight on bilateral extremities to decrease tone on right pelvic floor    Time 4    Period Weeks    Status Achieved             PT Long Term Goals - 09/14/19 1152      PT LONG TERM GOAL #1   Title independent with advanced HEP    Time 12    Period Weeks    Status On-going      PT LONG TERM GOAL #2   Title able to elongate the pelvic floor muscles to promote relaxation with penile penetration    Time 12    Period Weeks    Status Achieved      PT LONG TERM GOAL #3   Title pain with penile penetration </= 1-2/10 due to pelvis in correct alignment    Baseline had a flare-up    Time 12    Period Weeks    Status On-going      PT LONG TERM GOAL #4   Title left hip flexion >/= 125 degrees so she is able to sit at 90 degrees for penile penetration    Time 12    Period Weeks    Status Achieved      PT LONG TERM GOAL #5   Title able to have penile penetration without being anxious to prevent tightening of the tissue    Time 12    Period Weeks    Status Achieved                 Plan - 09/14/19 1102    Clinical Impression Statement Patient reports her pain has returned in the right lower abdominal but will reduce when she bulges the pelvic floor. Patient reports there is a chance of her being pregnant so we did not do manual work on the pelvis ore pelvic floor since it is early in the pregnancy. Today we discussed postures that can tighten the pelvic floor and cause forward head posture. Patient understands which postures this is and knows how to correct them. Patient is able to demonstrate ways to bulge the pelvic floor to improve muscle relaxation. Patient will benefit from skilled therapy to release and elongate the pelvic floor tissue.    Personal Factors and Comorbidities Comorbidity 1;Sex    Comorbidities s/p laproscopic surgery  Stability/Clinical Decision Making Evolving/Moderate complexity    Rehab Potential Excellent    PT Frequency 1x / week    PT Duration 12 weeks    PT Treatment/Interventions Biofeedback;Cryotherapy;Electrical Stimulation;Moist Heat;Ultrasound;Neuromuscular re-education;Therapeutic exercise;Therapeutic activities;Patient/family education;Manual techniques;Dry needling    PT Next Visit Plan see if patient is pregnant, if she is give her exercises for her pelvic floor and ways to expand the chest and rib cage, If not then work with manual tissue mobilization to reduce right lower abdominal pain    PT Home Exercise Plan Access Code:    Recommended Other Services MD signed all notes    Consulted and Agree with Plan of Care Patient           Patient will benefit from skilled therapeutic intervention in order to improve the following deficits and impairments:  Decreased coordination, Decreased range of motion, Increased fascial restricitons, Increased muscle spasms, Decreased activity tolerance, Pain, Impaired flexibility, Decreased strength  Visit Diagnosis: Cramp and spasm  Muscle weakness (generalized)  Stiffness of right hip, not elsewhere classified     Problem List Patient Active Problem List   Diagnosis Date Noted  . Female infertility 08/17/2019  . Symptomatic cholelithiasis 09/25/2011  . Biliary dyskinesia 09/25/2011  . Abdominal pain 08/24/2011    Eulis Foster, PT 09/14/19 11:54 AM   [facility] [address], [address] [place], [place], [address] Phone: [phone]   Fax:  [phone]  Name: LEIGHTON LUSTER MRN: [id number] Date of Birth: [birth year]

## 2019-09-14 NOTE — Patient Instructions (Signed)
Access Code: URL: [email or web address] Date: 09/14/2019 Prepared by: Eulis Foster  Exercises Supine Butterfly Groin Stretch - 1 x daily - 7 x weekly - 1 reps - 1 sets - 1-2 min hold Pilates Bridge - 1 x daily - 7 x weekly - 10 reps - 1 sets - 5 sec hold Supine Pelvic Floor Stretch - 1 x daily - 7 x weekly - 1 reps - 1 sets - 1-2 min hold Cat-Camel - 1 x daily - 7 x weekly - 10 reps - 1 sets Deep Squat with Pelvic Floor Relaxation - 1 x daily - 7 x weekly - 1 sets - 1 reps - 30 sec hold Doorway Pec Stretch at 120 Degrees Abduction - 1 x daily - 7 x weekly - 1 sets - 2 reps - 30 sec hold Sidelying Thoracic Rotation with Open Book - 1 x daily - 7 x weekly - 1 sets - 3 reps - 3 breaths hold Bird Dog - 1 x daily - 7 x weekly - 3 sets - 10 reps [facility] [address] [place], [place] [address] Phone # [phone] Fax [phone]

## 2019-09-15 ENCOUNTER — Telehealth: Payer: Self-pay

## 2019-09-15 NOTE — Telephone Encounter (Signed)
Received message from patient - She was start on Femara and  reports getting a positive pregnancy test and would like to know what to do. I advised her to start taking prenatal vitamins and we can see her around 10 weeks.This will give the baby time to grow and develop. Please advise

## 2019-09-28 ENCOUNTER — Other Ambulatory Visit: Payer: Self-pay

## 2019-09-28 ENCOUNTER — Ambulatory Visit (INDEPENDENT_AMBULATORY_CARE_PROVIDER_SITE_OTHER): Payer: 59

## 2019-09-28 VITALS — BP 103/62 | HR 70 | Ht 66.0 in | Wt 125.0 lb

## 2019-09-28 DIAGNOSIS — Z3400 Encounter for supervision of normal first pregnancy, unspecified trimester: Secondary | ICD-10-CM | POA: Insufficient documentation

## 2019-09-28 DIAGNOSIS — Z3201 Encounter for pregnancy test, result positive: Secondary | ICD-10-CM

## 2019-09-28 LAB — POCT URINE PREGNANCY: Preg Test, Ur: POSITIVE — AB

## 2019-09-28 NOTE — Progress Notes (Signed)
Krista Lee presents today for UPT. She has no unusual complaints.  LMP: 08/23/2019 EDD: 05/29/2020 [place]      [redacted]w[redacted]d    OBJECTIVE: Appears well, in no apparent distress.  OB History    Gravida  1   Para  0   Term  0   Preterm  0   AB  0   Living  0     SAB  0   TAB  0   Ectopic  0   Multiple  0   Live Births  0          Home UPT Result:POSITIVE X10 In-Office UPT result: POSITIVE  I have reviewed the patient's medical, obstetrical, social, and family histories, and medications.   ASSESSMENT: Positive pregnancy test  PLAN Prenatal care to be completed at: [facility]

## 2019-09-28 NOTE — Progress Notes (Signed)
I have reviewed the chart and agree with the nurse's note and plan of care for this visit.   Sharen Counter, CNM 5:30 PM

## 2019-10-12 ENCOUNTER — Encounter: Payer: Self-pay | Admitting: Physical Therapy

## 2019-10-12 ENCOUNTER — Ambulatory Visit: Payer: 59 | Attending: Advanced Practice Midwife | Admitting: Physical Therapy

## 2019-10-12 ENCOUNTER — Other Ambulatory Visit: Payer: Self-pay

## 2019-10-12 DIAGNOSIS — R252 Cramp and spasm: Secondary | ICD-10-CM | POA: Insufficient documentation

## 2019-10-12 DIAGNOSIS — M25651 Stiffness of right hip, not elsewhere classified: Secondary | ICD-10-CM | POA: Insufficient documentation

## 2019-10-12 DIAGNOSIS — M6281 Muscle weakness (generalized): Secondary | ICD-10-CM | POA: Diagnosis present

## 2019-10-12 NOTE — Therapy (Addendum)
[facility] [address], [place], [place], [address] Phone: [phone]   Fax:  [phone]  Physical Therapy Treatment  Patient Details  Name: Krista Lee MRN: [id number] Date of Birth: 04/07/1985 Referring Provider (PT): Fatima Blank, PT   Encounter Date: 10/12/2019   PT End of Session - 10/12/19 1112    Visit Number 9    Date for PT Re-Evaluation 10/20/19    Authorization Type BCBS    PT Start Time 1107    PT Stop Time 1145    PT Time Calculation (min) 38 min    Activity Tolerance Patient tolerated treatment well    Behavior During Therapy [facility] for tasks assessed/performed           Past Medical History:  Diagnosis Date  . ADD (attention deficit disorder)   . Allergy    no meds  . Anemia    as adolescent  . Anxiety   . Depression   . GERD (gastroesophageal reflux disease)    doesn't take any meds for this, diet controlled  . Headache(784.0)   . Heart murmur    as a child - no problems  . History of bronchitis    as a teenager, no problems as adult  . History of migraine    can't remember when the last one was  . Insomnia    no meds  . Panic attacks     Past Surgical History:  Procedure Laterality Date  . CHOLECYSTECTOMY  10/22/2011   Procedure: LAPAROSCOPIC CHOLECYSTECTOMY;  Surgeon: Harl Bowie, MD;  Location: [facility];  Service: General;  Laterality: N/A;  . HYSTEROSCOPY WITH RESECTOSCOPE N/A 04/28/2013   Procedure:  HYSTEROSCOPY ;  Surgeon: Princess Bruins, MD;  Location: [facility];  Service: Gynecology;  Laterality: N/A;  . LAPAROSCOPY N/A 04/28/2013   Procedure: LAPAROSCOPY DIAGNOSTIC;  Surgeon: Princess Bruins, MD;  Location: [facility];  Service: Gynecology;  Laterality: N/A;  2 hrs. total  . WISDOM TOOTH EXTRACTION  110yr ago    There were no vitals filed for this visit.   Subjective Assessment - 10/12/19 1107    Subjective I have noticed that I clench alot. Patient  reports she is pregnant. I am getting cramping on the lower right side. I had 2 quick sharp pains.    Patient Stated Goals reduce pain with intercourse    Currently in Pain? Yes    Pain Score 5     Pain Location Abdomen    Pain Orientation Right    Pain Descriptors / Indicators Pressure    Pain Type Chronic pain    Pain Onset More than a month ago    Pain Frequency Intermittent    Aggravating Factors  on top with being more forward    Pain Relieving Factors change position    Multiple Pain Sites No              [facility] PT Assessment - 10/12/19 0001      Assessment   Medical Diagnosis R10.2, G89.29 Chronic pelvic pain in female    Referring Provider (PT) LFatima Blank PT    Prior Therapy none      Precautions   Precautions Other (comment)    Precaution Comments pregnant      Restrictions   Weight Bearing Restrictions No      Home Environment   Living Environment Private residence      Prior Function   Level of Independence Independent  Cognition   Overall Cognitive Status Within Functional Limits for tasks assessed      PROM   Right Hip Flexion 130    Right Hip External Rotation  85    Left Hip Flexion 125    Left Hip External Rotation  65      Strength   Right Hip External Rotation  5/5    Right Hip ABduction 5/5    Left Hip External Rotation 5/5    Left Hip ABduction 5/5      Palpation   SI assessment  ASIS is equal    Palpation comment tenderness located in bil. lower quadrant                      Pelvic Floor Special Questions - 10/12/19 0001    Currently Sexually Active Yes    Is this Painful Yes    Marinoff Scale discomfort that does not affect completion    Urinary Leakage Yes    Activities that cause leaking Sneezing   1 time   Pelvic Floor Internal Exam Patient confirms identifcation and approves PT to assess pelvic floor and treatment    Exam Type Vaginal    Strength fair squeeze, definite lift             [facility]  Adult PT Treatment/Exercise - 10/12/19 0001      Self-Care   Self-Care Other Self-Care Comments    Other Self-Care Comments  instruction on instagram people to follow for information, baby belly band to purchase if her back hurts, information on perineal massage to so at [redacted] weeks along in the pregnancy.      Neuro Re-ed    Neuro Re-ed Details  breathing to elongate the pelvic floor due to tightness      Manual Therapy   Manual Therapy Internal Pelvic Floor    Internal Pelvic Floor soft tissue work to the pelvic floor muscles to assess for pain, manual work to the bulbocavernosis to assist in reduction in muscle tightness, as she is breathing to elongate the muscles the therapist is using her finger to assist in pelvic floor lengthening                  PT Education - 10/12/19 1150    Education Details educatioin on things to do while she is pregnant, suggested websits and instagram    Person(s) Educated Patient    Methods Explanation;Demonstration;Handout    Comprehension Verbalized understanding            PT Short Term Goals - 10/12/19 1200      PT SHORT TERM GOAL #1   Title independent with initial HEP    Time 4    Period Weeks    Status Achieved    Target Date 02/23/19      PT SHORT TERM GOAL #2   Title understand how to massage the pelvic floor muscles and perineal body to elongate tissue    Time 4    Period Weeks    Status Achieved    Target Date 02/23/19      PT SHORT TERM GOAL #3   Title understand on standing with equal weight on bilateral extremities to decrease tone on right pelvic floor    Time 4    Period Weeks    Status Achieved    Target Date 02/23/19             PT Long Term Goals - 10/12/19 1200  PT LONG TERM GOAL #1   Title independent with advanced HEP    Time 12    Status Achieved      PT LONG TERM GOAL #2   Title able to elongate the pelvic floor muscles to promote relaxation with penile penetration    Time 12    Period  Weeks    Status Achieved      PT LONG TERM GOAL #3   Title pain with penile penetration </= 1-2/10 due to pelvis in correct alignment    Baseline pain level is 5/10 but difficult to palpate where the pain is internally    Time 12    Period Weeks    Status Partially Met      PT LONG TERM GOAL #4   Title left hip flexion >/= 125 degrees so she is able to sit at 90 degrees for penile penetration    Time 12    Period Weeks    Status Achieved      PT LONG TERM GOAL #5   Title able to have penile penetration without being anxious to prevent tightening of the tissue    Time 12    Period Weeks    Status Achieved                 Plan - 10/12/19 1201    Clinical Impression Statement Patient is now [redacted] weeks along in her pregnancy. She will have pain at level 5/10 with penile penetration. Patient feels cramping in the right lower quadrant now. Patient is able to relax her pelvic floor with breathing technique. Patient understands were to find information as her pergnancy moves forward. Patient has met all goals with exception of pain goal. Patient is independent with her HEP.    Personal Factors and Comorbidities Comorbidity 1;Sex    Comorbidities s/p laproscopic surgery    Examination-Participation Restrictions Interpersonal Relationship    Stability/Clinical Decision Making Evolving/Moderate complexity    Rehab Potential Excellent    PT Treatment/Interventions Biofeedback;Cryotherapy;Electrical Stimulation;Moist Heat;Ultrasound;Neuromuscular re-education;Therapeutic exercise;Therapeutic activities;Patient/family education;Manual techniques;Dry needling    PT Next Visit Plan Discharge to HEP    PT Home Exercise Plan Access Code: [email or web address]    Consulted and Agree with Plan of Care Patient           Patient will benefit from skilled therapeutic intervention in order to improve the following deficits and impairments:  Decreased coordination, Decreased range of motion, Increased fascial  restricitons, Increased muscle spasms, Decreased activity tolerance, Pain, Impaired flexibility, Decreased strength  Visit Diagnosis: Cramp and spasm  Muscle weakness (generalized)  Stiffness of right hip, not elsewhere classified     Problem List Patient Active Problem List   Diagnosis Date Noted  . Supervision of normal first pregnancy 09/28/2019  . Female infertility 08/17/2019  . Symptomatic cholelithiasis 09/25/2011  . Biliary dyskinesia 09/25/2011  . Abdominal pain 08/24/2011    Earlie Counts, PT 10/12/19 12:08 PM    [facility] [address], [place], [place], [address] Phone: [phone]   Fax:  [phone]  Name: Krista Lee MRN: [id number] Date of Birth: [birth year]  PHYSICAL THERAPY DISCHARGE SUMMARY  Visits from Start of Care: 9  Current functional level related to goals / functional outcomes: See above.    Remaining deficits: See above.    Education / Equipment: HEP  Plan: Patient agrees to discharge.  Patient goals were met. Patient is being discharged due to meeting the stated rehab goals.  Thank you  for the referral. Earlie Counts, PT 10/12/19 12:05 PM  ?????

## 2019-10-12 NOTE — Patient Instructions (Addendum)
instagram [email or web address]  [facility] PT  Dr. Hermine Messick The Vagina Whisperer The post partum PT My pelvic floor muscles  [email or web address]  Perineal massage for pregnancy at 35 weeks  [email or web address] for support when you feel your back   [email or web address] -email me if you have questions [facility] [address] [place], [place] [address] Phone # [phone] Fax [phone]

## 2019-10-27 ENCOUNTER — Ambulatory Visit: Payer: 59

## 2019-10-27 ENCOUNTER — Ambulatory Visit (INDEPENDENT_AMBULATORY_CARE_PROVIDER_SITE_OTHER): Payer: 59

## 2019-10-27 ENCOUNTER — Other Ambulatory Visit: Payer: Self-pay

## 2019-10-27 VITALS — BP 112/70 | HR 63 | Ht 66.0 in | Wt 129.2 lb

## 2019-10-27 DIAGNOSIS — Z3400 Encounter for supervision of normal first pregnancy, unspecified trimester: Secondary | ICD-10-CM

## 2019-10-27 DIAGNOSIS — O3680X0 Pregnancy with inconclusive fetal viability, not applicable or unspecified: Secondary | ICD-10-CM

## 2019-10-27 DIAGNOSIS — Z789 Other specified health status: Secondary | ICD-10-CM | POA: Diagnosis not present

## 2019-10-27 DIAGNOSIS — Z3A08 8 weeks gestation of pregnancy: Secondary | ICD-10-CM | POA: Diagnosis not present

## 2019-10-27 NOTE — Progress Notes (Signed)
PRENATAL INTAKE SUMMARY  Ms. Maina presents today New OB Nurse Interview.  OB History    Gravida  1   Para  0   Term  0   Preterm  0   AB  0   Living  0     SAB  0   TAB  0   Ectopic  0   Multiple  0   Live Births  0          I have reviewed the patient's medical, obstetrical, social, and family histories, medications, and available lab results.  SUBJECTIVE She has no unusual complaints. She states that she noticed one episode of slight brown discharge this am and minimal cramping.  OBJECTIVE Initial Physical Exam (New OB)  GENERAL APPEARANCE: alert, well appearing   ASSESSMENT Normal pregnancy  PLAN Patient plans to switch her care to [facility].  New OB Labs will be completed at [facility]. U/S performed today reveals single live IUP at [redacted]w[redacted]d by CRL. FHR 169.

## 2019-11-02 ENCOUNTER — Ambulatory Visit (INDEPENDENT_AMBULATORY_CARE_PROVIDER_SITE_OTHER): Payer: 59 | Admitting: Advanced Practice Midwife

## 2019-11-02 ENCOUNTER — Other Ambulatory Visit: Payer: Self-pay

## 2019-11-02 ENCOUNTER — Encounter: Payer: Self-pay | Admitting: Advanced Practice Midwife

## 2019-11-02 ENCOUNTER — Other Ambulatory Visit (HOSPITAL_COMMUNITY)
Admission: RE | Admit: 2019-11-02 | Discharge: 2019-11-02 | Disposition: A | Discharge status: Home / Self Care | Payer: 59 | Source: Ambulatory Visit | Attending: Advanced Practice Midwife | Admitting: Advanced Practice Midwife

## 2019-11-02 VITALS — BP 99/62 | HR 76 | Wt 130.0 lb

## 2019-11-02 DIAGNOSIS — Z3A10 10 weeks gestation of pregnancy: Secondary | ICD-10-CM

## 2019-11-02 DIAGNOSIS — Z3401 Encounter for supervision of normal first pregnancy, first trimester: Secondary | ICD-10-CM

## 2019-11-02 NOTE — Progress Notes (Signed)
Subjective:   Krista Lee is a 34 y.o. G1P0000 at [redacted]w[redacted]d by LMP being seen today for her first obstetrical visit.  Her obstetrical history is significant for none, G1 and has Abdominal pain; Symptomatic cholelithiasis; Biliary dyskinesia; Female infertility; and Supervision of normal first pregnancy on their problem list.. Patient does intend to breast feed. Pregnancy history fully reviewed.  Patient reports no complaints.  HISTORY: OB History  Gravida Para Term Preterm AB Living  1 0 0 0 0 0  SAB TAB Ectopic Multiple Live Births  0 0 0 0 0    # Outcome Date GA Lbr Len/2nd Weight Sex Delivery Anes PTL Lv  1 Current            Past Medical History:  Diagnosis Date  . ADD (attention deficit disorder)   . Allergy    no meds  . Anemia    as adolescent  . Anxiety   . Depression   . GERD (gastroesophageal reflux disease)    doesn't take any meds for this, diet controlled  . Headache(784.0)   . Heart murmur    as a child - no problems  . History of bronchitis    as a teenager, no problems as adult  . History of migraine    can't remember when the last one was  . Insomnia    no meds  . Panic attacks    Past Surgical History:  Procedure Laterality Date  . CHOLECYSTECTOMY  10/22/2011   Procedure: LAPAROSCOPIC CHOLECYSTECTOMY;  Surgeon: Shelly Rubenstein, MD;  Location: [facility];  Service: General;  Laterality: N/A;  . HYSTEROSCOPY WITH RESECTOSCOPE N/A 04/28/2013   Procedure:  HYSTEROSCOPY ;  Surgeon: Genia Del, MD;  Location: [facility];  Service: Gynecology;  Laterality: N/A;  . LAPAROSCOPY N/A 04/28/2013   Procedure: LAPAROSCOPY DIAGNOSTIC;  Surgeon: Genia Del, MD;  Location: [facility];  Service: Gynecology;  Laterality: N/A;  2 hrs. total  . WISDOM TOOTH EXTRACTION  66yrs ago   Family History  Problem Relation Age of Onset  . Hypertension Mother   . Diverticulitis Mother   . Hyperlipidemia Mother   . Arthritis Father   . Rheum arthritis Paternal Grandmother     Social History   Tobacco Use  . Smoking status: Former Smoker    Packs/day: 0.50    Years: 8.00    Pack years: 4.00    Types: Cigarettes    Quit date: 08/2019    Years since quitting: 0.2  . Smokeless tobacco: Former Neurosurgeon    Quit date: 01/02/2011  Vaping Use  . Vaping Use: Former  Substance Use Topics  . Alcohol use: Not Currently    Alcohol/week: 14.0 standard drinks    Types: 14 Standard drinks or equivalent per week    Comment: last drink 6 weeks ago  . Drug use: No   Allergies  Allergen Reactions  . Ibuprofen Other (See Comments)    Childhood reaction of high fever  . Percocet [Oxycodone-Acetaminophen] Nausea Only    Pt can take regular tylenol but in combination with codeine products is makes her very sick to her stomach.   Current Outpatient Medications on File Prior to Visit  Medication Sig Dispense Refill  . acetaminophen (TYLENOL) 500 MG tablet Take 1,000 mg by mouth every 6 (six) hours as needed for moderate pain.    . cetirizine (ZYRTEC) 10 MG tablet Take 10 mg by mouth daily.    . Prenatal Vit-Fe Fumarate-FA (MULTIVITAMIN-PRENATAL) 27-0.8  MG TABS tablet Take 1 tablet by mouth daily at 12 noon.    . Probiotic Product (PROBIOTIC-10 PO) Take by mouth.    . Multiple Vitamins-Minerals (MULTIVITAMIN WITH MINERALS) tablet Take 1 tablet by mouth daily.    . Turmeric (QC TUMERIC COMPLEX) 500 MG CAPS Take by mouth.     Marland Kitchen VITAMIN D PO Take by mouth.      No current facility-administered medications on file prior to visit.    Indications for ASA therapy (per uptodate) One of the following: Previous pregnancy with preeclampsia, especially early onset and with an adverse outcome No Multifetal gestation No Chronic hypertension No Type 1 or 2 diabetes mellitus No Chronic kidney disease No Autoimmune disease (antiphospholipid syndrome, systemic lupus erythematosus) No   Two or more of the following: Nulliparity Yes Obesity (body mass index >30 kg/m2) No Family  history of preeclampsia in mother or sister No Age ?35 years No Sociodemographic characteristics (African American race, low socioeconomic level) No Personal risk factors (eg, previous pregnancy with low birth weight or small for gestational age infant, previous adverse pregnancy outcome [eg, stillbirth], interval >10 years between pregnancies) No   Indications for early 1 hour GTT (per uptodate)  BMI >25 (>23 in Asian women) : No BMI 20   Exam   Vitals:   11/02/19 1410  BP: 99/62  Pulse: 76  Weight: 130 lb (59 kg)    VS reviewed, nursing note reviewed,  Constitutional: well developed, well nourished, no distress HEENT: normocephalic HEART: normal rate, heart sounds, regular rhythm RESP: normal effort, lung sounds clear and equal bilaterally Abdomen: soft Neuro: alert and oriented x 3 Skin: warm, dry Psych: affect normal   Assessment:   Pregnancy: G1P0000 Patient Active Problem List   Diagnosis Date Noted  . Supervision of normal first pregnancy 09/28/2019  . Female infertility 08/17/2019  . Symptomatic cholelithiasis 09/25/2011  . Biliary dyskinesia 09/25/2011  . Abdominal pain 08/24/2011     Plan:  1. Encounter for supervision of normal first pregnancy in first trimester --Anticipatory guidance about next visits/weeks of pregnancy given. --Next visit in 4 weeks in person for AFP  --Pap wnl 01/12/19 - CBC/D/Plt+RPR+Rh+ABO+Rub Ab... - Genetic Screening - Cervicovaginal ancillary only( [place]) - Culture, OB Urine - Enroll Patient in Babyscripts - Babyscripts Schedule Optimization  2. [redacted] weeks gestation of pregnancy    Initial labs drawn. Continue prenatal vitamins. Discussed and offered genetic screening options, including Quad screen/AFP, NIPS testing, and option to decline testing. Benefits/risks/alternatives reviewed. Pt aware that anatomy US is form of genetic screening with lower accuracy in detecting trisomies than blood work.  Pt chooses genetic  screening today. NIPS: requested. Ultrasound discussed; fetal anatomic survey: requested. Problem list reviewed and updated. The nature of [facility] with multiple MDs and other Advanced Practice Providers was explained to patient; also emphasized that residents, students are part of our team. Routine obstetric precautions reviewed. Return in about 4 weeks (around 11/30/2019).   Sharen Counter, CNM 11/02/19 3:13 PM

## 2019-11-03 LAB — CBC/D/PLT+RPR+RH+ABO+RUB AB...
Antibody Screen: NEGATIVE
Basophils Absolute: 0 10*3/uL (ref 0.0–0.2)
Basos: 0 %
EOS (ABSOLUTE): 0.1 10*3/uL (ref 0.0–0.4)
Eos: 1 %
HCV Ab: <0.1 s/co ratio (ref 0.0–0.9)
HIV Screen 4th Generation wRfx: NONREACTIVE
Hematocrit: 41 % (ref 34.0–46.6)
Hemoglobin: 13.7 g/dL (ref 11.1–15.9)
Hepatitis B Surface Ag: NEGATIVE
Immature Grans (Abs): 0 10*3/uL (ref 0.0–0.1)
Immature Granulocytes: 0 %
Lymphocytes Absolute: 2.7 10*3/uL (ref 0.7–3.1)
Lymphs: 27 %
MCH: 30.9 pg (ref 26.6–33.0)
MCHC: 33.4 g/dL (ref 31.5–35.7)
MCV: 93 fL (ref 79–97)
Monocytes Absolute: 0.6 10*3/uL (ref 0.1–0.9)
Monocytes: 6 %
Neutrophils Absolute: 6.4 10*3/uL (ref 1.4–7.0)
Neutrophils: 66 %
Platelets: 323 10*3/uL (ref 150–450)
RBC: 4.43 x10E6/uL (ref 3.77–5.28)
RDW: 11.8 % (ref 11.7–15.4)
RPR Ser Ql: NONREACTIVE
Rh Factor: POSITIVE
Rubella Antibodies, IGG: 4.6 index (ref >0.99)
WBC: 9.8 10*3/uL (ref 3.4–10.8)

## 2019-11-03 LAB — CERVICOVAGINAL ANCILLARY ONLY
Chlamydia: NEGATIVE
Comment: NEGATIVE
Comment: NORMAL
Neisseria Gonorrhea: NEGATIVE

## 2019-11-03 LAB — HCV INTERPRETATION

## 2019-11-04 LAB — CULTURE, OB URINE

## 2019-11-04 LAB — URINE CULTURE, OB REFLEX

## 2019-11-06 ENCOUNTER — Encounter: Payer: Self-pay | Admitting: Physical Therapy

## 2019-11-09 ENCOUNTER — Encounter: Payer: 59 | Admitting: Advanced Practice Midwife

## 2019-11-09 ENCOUNTER — Encounter: Payer: Self-pay | Admitting: Advanced Practice Midwife

## 2019-11-09 ENCOUNTER — Other Ambulatory Visit: Payer: Self-pay | Admitting: Advanced Practice Midwife

## 2019-11-09 DIAGNOSIS — O285 Abnormal chromosomal and genetic finding on antenatal screening of mother: Secondary | ICD-10-CM

## 2019-11-10 ENCOUNTER — Encounter: Payer: Self-pay | Admitting: Advanced Practice Midwife

## 2019-11-16 ENCOUNTER — Ambulatory Visit: Payer: Self-pay

## 2019-11-16 ENCOUNTER — Other Ambulatory Visit: Payer: Self-pay

## 2019-11-16 ENCOUNTER — Ambulatory Visit: Payer: 59 | Attending: Advanced Practice Midwife | Admitting: Genetic Counselor

## 2019-11-16 DIAGNOSIS — Z315 Encounter for genetic counseling: Secondary | ICD-10-CM | POA: Diagnosis not present

## 2019-11-16 DIAGNOSIS — O285 Abnormal chromosomal and genetic finding on antenatal screening of mother: Secondary | ICD-10-CM | POA: Diagnosis not present

## 2019-11-16 NOTE — Progress Notes (Signed)
11/16/2019  Krista Lee Apr 17, 1985 MRN: [id number] DOV: 11/16/2019  Krista Lee presented to the [facility] for a genetics consultation regarding noninvasive prenatal screening (NIPS) results that were high risk for trisomy 21. Krista Lee was accompanied to her appointment by her partner, Krista Lee.   Indication for genetic counseling - NIPS high risk for trisomy 60  Prenatal history  Krista Lee is a G1P0000, 34 y.o. female. Her current pregnancy has completed [redacted]w[redacted]d (Estimated Date of Delivery: 05/29/20).  Krista Lee denied exposure to environmental toxins or chemical agents. She denied the use of alcohol, tobacco or street drugs. She reported taking prenatal vitamins, a multivitamin, vitamin D, and probiotics. She took Clomid prior to this pregnancy. She denied significant viral illnesses, fevers, and bleeding during the course of her pregnancy. She reported a history of gastrointestinal issues and pelvic pain. She had a cholecystectomy at age 65. Her medical and surgical histories were otherwise noncontributory.  Family History  A three generation pedigree was drafted and reviewed. The family history is remarkable for the following:  - Krista Lee sister has a history of infertility. She has had two miscarriages of pregnancies conceived via IVF and does not have any children. Krista Lee mother also reportedly had trouble getting pregnant, and Krista Lee also took Clomid prior to this pregnancy. The cause of infertility in the family is unknown.   - Mr. Symes's mother has a paternal half brother with possible muscular dystrophy. Mr. Elissa Hefty was not sure of the specific subtype of muscular dystrophy this relative is affected by. We reviewed that muscular dystrophy can be inherited in an autosomal dominant, autosomal recessive, or X-linked fashion. Without knowing the cause of muscular dystrophy in the family, risk assessment was limited.  - Ms. Symes's mother has a  paternal half sister with learning difficulties. We discussed that many times, learning difficulties are multifactorial in nature, occurring due to a combination of genetic and environmental factors that are difficult to identify. Since this relative is a fourth degree relative to the couple's children, they are likely not at increased risk for learning difficulties based on family history alone. However, without knowing the etiology of learning difficulties in the family, precise risk assessment was limited.  - Mr. Symes has a paternal aunt whose grandson Ave Filter has Down syndrome. We reviewed that if Ave Filter has full trisomy 21, there would not be an impact on the couple's risk to have a pregnancy affected by Down syndrome, as this process most often occurs sporadically. However, we reviewed that every woman has an age-related risk for having a pregnancy affected by a chromosomal condition such as Down syndrome. Ms. Deats had NIPS performed during this pregnancy to assess for the chance of the current pregnancy being affected by Down syndrome. See Discussion section for more details.  The remaining family histories were reviewed and found to be noncontributory for birth defects, intellectual disability, recurrent pregnancy loss, and known genetic conditions. Ms. Elissa Hefty had limited information about his paternal family history; thus, risk assessment was limited.  The patient's ancestry is [place], [place], [place], [place], and [place]. The father of the pregnancy's ancestry is [place] and [place]. Ashkenazi Jewish ancestry and consanguinity were denied. Pedigree will be scanned under Media.  Discussion  NIPS result:   Krista Lee was referred for genetic counseling as results from noninvasive prenatal screening (NIPS) came back positive for an increased risk of trisomy 33, AKA Down syndrome in the current pregnancy. Down syndrome is one  of the most common extra chromosome conditions, as approximately 1 in 800  babies are born with this condition. There are different types of Down syndrome, with each type determined by the arrangement of the chromosome 21 pair. Approximately 95% of cases are caused by an entire extra copy of chromosome 21 (trisomy 21), 2-4% of cases are due to a chromosomal rearrangement (translocation) involving chromosome 21, and 1-2% of cases are due to trisomy 8421 mosaicism. We reviewed that Down syndrome most commonly occurs by chance due to an error in chromosomal division during the formation of egg and sperm cells in a process called nondisjunction.    Down syndrome is characterized by a distinctive facial appearance, mild to moderate intellectual disability, and an increased chance for a heart defect. Approximately half of babies with Down syndrome are born with a heart defect that may require surgery after birth. While many children with Down syndrome look similar to each other, each child with Down syndrome is unique and will have many more features in common with his or her own family members. Children with Down syndrome also have an increased chance for thyroid problems, which can range from an underactive to an overactive thyroid. Additionally, low muscle tone, gastrointestinal abnormalities, vision problems, and respiratory and ear infections are more common among babies with Down syndrome. We discussed that there are many more features that can be associated with Down syndrome; however, it is not possible to accurately predict all features that would be present in an individual with Down syndrome prenatally. Additionally, there is a high degree of variability seen among children who have this condition, meaning that every child with Down syndrome will not be affected in exactly the same way, and some children will have more or less features than others. It is not possible to predict what strengths and weaknesses a child with Down syndrome will have, just like it is not possible to predict  this for any child.    Krista Lee was also counseled that there is an increased fetal loss rate associated with Down syndrome, with up to 30% of affected pregnancies miscarrying between 12 weeks' gestation and term. Of affected liveborn infants, 90% survive the first 10 years of life. With advances in medical technology, early intervention, and supportive therapies, many individuals with Down syndrome are able to live with an increasing degree of independence. Today, many adults with Down syndrome care for themselves, have jobs, and may live in group homes or apartments where assistance is available if needed.   We reviewed that NIPS analyzes cell free DNA originating from the placenta that is found in the maternal blood circulation during pregnancy. This test can provide information regarding the presence or absence of extra fetal DNA for chromosomes 13, 18 and 21 as well as the sex chromosomes. The reported detection rate is greater than 99% for trisomy 21, greater than 99% for trisomy 18, greater than 91% for trisomy 13, and greater than 94% for monosomy X. However, it cannot be considered diagnostic for chromosome conditions. Positive predictive value (PPV) is the probability that a pregnancy with a positive test result is truly affected. The PPV reported by the NIPS laboratory for trisomy 21 in the current pregnancy was estimated to be 90%. The PPV calculator offered by the [employer] and the [employer] estimated PPV for trisomy 21 in the current pregnancy to be 68%. Thus, there is a ~70-90% chance that this could be a true positive result.  Krista Lee was counseled that there are several possible explanations for her high risk NIPS result. Firstly, the fetus could truly be affected by Down syndrome. Secondly, the fetus could be mosaic for Down syndrome, though this is rare. Mosaicism occurs when an individual has two or more genetically different sets of cells  in their body. Individuals who are mosaic for Down syndrome have some cells in the body with trisomy 21 and may have other cells that are chromosomally normal. We discussed that it would not be possible to tell which features an individual with mosaic Down syndrome may have, as it is impossible to assess which specific cells and tissues in the body have Down syndrome. Individuals who are mosaic for Down syndrome may be more mildly affected than individuals with full trisomy 21, though this is not always the case. Thirdly, the placenta could have trisomy 21 while the fetus could be unaffected. This is a phenomenon known as confined placental mosaicism (CPM). Lastly, this could be a false positive result.  Further testing:   Krista Lee was also counseled regarding the option of diagnostic testing via chorionic villus sampling (CVS) or amniocentesis. We discussed the technical aspects of each procedure and quoted up to a 1 in 500 (0.2%) risk for spontaneous pregnancy loss or other adverse pregnancy outcomes as a result of either procedure. Cultured cells from either a placental or amniotic fluid sample allow for the visualization of a fetal karyotype, which can detect >99% of large chromosomal aberrations. Chromosomal microarray can also be performed to identify smaller deletions or duplications of fetal chromosomal material. Krista Lee was informed that diagnostic testing would be the only way to definitively determine if the fetus has Down syndrome prenatally. Krista Lee was also made aware that she has the option of continuing with standard ultrasounds and pursuing genetic testing postnatally. We discussed that approximately half (50%) of fetuses with Down syndrome demonstrate signs of the condition on anatomy ultrasound. Additionally, fetal echocardiogram for a deeper assessment of cardiac defects is recommended given the increased risk for congenital heart defects associated with Down syndrome. After careful  consideration, Krista Lee declined diagnostic testing at this time.   Plan:  The couple declined further testing today. They prefer to follow the pregnancy via routine ultrasounds and fetal echocardiogram and pursue genetic testing postnatally. Krista Lee and her partner informed me that they are at peace with the possibility of the baby having Down syndrome. They are intimately familiar with the condition given Mr. Symes's family history and told me that Ave Filter is one of their favorite people in the world. Since diagnostic testing will not affect how they manage the pregnancy, they would prefer not to take any necessary risks associated with diagnostic testing procedures. They would like postnatal testing to aid in determining recurrence risks for future pregnancies.   Krista Lee expressed interest in a first trimester complete ultrasound. However, she wished to learn about the cost of this ultrasound first prior to scheduling an appointment in case her insurance does not cover the cost. I will follow-up with her once I have determined the cost of a first trimester complete ultrasound.  The couple had several additional questions, including recommendations for daycares and pediatricians that have experience caring for children with Down syndrome and information about Medicaid/health insurance coverage for their baby after birth should they be affected. I will reach out to our pediatric genetics team to determine if they have any recommendations for pediatricians and daycares in the [place] area.  I will also contact our social work team to determine if Ms. Skog needs to do anything prenatally to secure insurance coverage for her infant.  I provided the couple with several support resources at their request, including a guidebook for expectant parents of babies with Down syndrome form the National Down Syndrome Society and a Recruitment consultant from the Down Syndrome Network of [place]. While  they understand that their NIPS result is not a definitive diagnosis, they prefer to prepare and educate themselves as thoroughly as possible during the prenatal period.  I counseled Ms. Bernardini regarding the above risks and available options. Second year [employer] genetic counseling student Jacklynn Lewis participated in portions of today's session under my supervision. The approximate face-to-face time with the genetic counselor was 90 minutes.  In summary:  Discussed NIPS result   NIPS high risk for trisomy21(PPV68-90%)  Offered additional testing and screening  Declined CVS, opting to pursue genetic testing postnatally  Will schedule for anatomy scan and refer for fetal echocardiogram  Will follow-up with patient about cost of first trimester complete ultrasound and recommendations for daycare, pediatricians, and postnatal insurance coverage  Reviewed family history concerns  Partner has cousin with trisomy 38   Gershon Crane, MS, [facility] Dentist

## 2019-11-17 ENCOUNTER — Encounter: Payer: Self-pay | Admitting: Genetic Counselor

## 2019-11-18 ENCOUNTER — Other Ambulatory Visit: Payer: Self-pay | Admitting: *Deleted

## 2019-11-18 DIAGNOSIS — Q999 Chromosomal abnormality, unspecified: Secondary | ICD-10-CM

## 2019-11-30 ENCOUNTER — Ambulatory Visit (INDEPENDENT_AMBULATORY_CARE_PROVIDER_SITE_OTHER): Payer: 59

## 2019-11-30 ENCOUNTER — Other Ambulatory Visit: Payer: Self-pay

## 2019-11-30 ENCOUNTER — Ambulatory Visit (INDEPENDENT_AMBULATORY_CARE_PROVIDER_SITE_OTHER): Payer: 59 | Admitting: Advanced Practice Midwife

## 2019-11-30 ENCOUNTER — Encounter: Payer: Self-pay | Admitting: *Deleted

## 2019-11-30 ENCOUNTER — Encounter (HOSPITAL_BASED_OUTPATIENT_CLINIC_OR_DEPARTMENT_OTHER): Payer: Self-pay | Admitting: Obstetrics and Gynecology

## 2019-11-30 VITALS — BP 106/57 | HR 79 | Wt 132.0 lb

## 2019-11-30 DIAGNOSIS — O351XX0 Maternal care for (suspected) chromosomal abnormality in fetus, not applicable or unspecified: Secondary | ICD-10-CM

## 2019-11-30 DIAGNOSIS — O368390 Maternal care for abnormalities of the fetal heart rate or rhythm, unspecified trimester, not applicable or unspecified: Secondary | ICD-10-CM | POA: Diagnosis not present

## 2019-11-30 DIAGNOSIS — O021 Missed abortion: Secondary | ICD-10-CM | POA: Diagnosis not present

## 2019-11-30 DIAGNOSIS — Z3A14 14 weeks gestation of pregnancy: Secondary | ICD-10-CM

## 2019-11-30 DIAGNOSIS — O3513X0 Maternal care for (suspected) chromosomal abnormality in fetus, trisomy 21, not applicable or unspecified: Secondary | ICD-10-CM

## 2019-11-30 DIAGNOSIS — Z3401 Encounter for supervision of normal first pregnancy, first trimester: Secondary | ICD-10-CM

## 2019-11-30 NOTE — Progress Notes (Signed)
   PRENATAL VISIT NOTE  Subjective:  Krista Lee is a 34 y.o. G1P0000 at [redacted]w[redacted]d being seen today for ongoing prenatal care.  She is currently monitored for the following issues for this low-risk pregnancy and has Abdominal pain; Symptomatic cholelithiasis; Biliary dyskinesia; Female infertility; and Supervision of normal first pregnancy on their problem list.  Patient reports backache.  Contractions: Not present. Vag. Bleeding: None.   . Denies leaking of fluid.   The following portions of the patient's history were reviewed and updated as appropriate: allergies, current medications, past family history, past medical history, past social history, past surgical history and problem list.   Objective:   Vitals:   11/30/19 1008  BP: (!) 106/57  Pulse: 79  Weight: 132 lb (59.9 kg)    Fetal Status:           General:  Alert, oriented and cooperative. Patient is in no acute distress.  Skin: Skin is warm and dry. No rash noted.   Cardiovascular: Normal heart rate noted  Respiratory: Normal respiratory effort, no problems with respiration noted  Abdomen: Soft, gravid, appropriate for gestational age.  Pain/Pressure: Present     Pelvic: Cervical exam deferred        Extremities: Normal range of motion.     Mental Status: Normal mood and affect. Normal behavior. Normal judgment and thought content.   Assessment and Plan:  Pregnancy: G1P0000 at [redacted]w[redacted]d 1. [redacted] weeks gestation of pregnancy   2. Encounter for supervision of normal first pregnancy in first trimester   3. Unable to hear fetal heart tones as reason for ultrasound scan --Bedside US without visible hearttones so formal US done --No FHT on formal US today - US OB Limited; Future  4. 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 Missed abortion with fetal demise before 20 completed weeks of gestation --Discussed results of [place] with pt at time of [place]. Pt husband came from work to the  office and I discussed US findings and plan of care with both the pt and her husband. --If available, pt prefers D&E to Cytotec induction --Message sent to [place] to schedule with appropriate provider --Precautions given, pt to come to MAU with pain or heavy bleeding  Preterm labor symptoms and general obstetric precautions including but not limited to vaginal bleeding, contractions, leaking of fluid and fetal movement were reviewed in detail with the patient. Please refer to After Visit Summary for other counseling recommendations.   No follow-ups on file.  Future Appointments  Date Time Provider Department Center  12/29/2019 12:45 PM [facility] NURSE [facility]  12/29/2019  1:00 PM [facility] US1 [facility]    Sharen Counter, CNM

## 2019-12-03 ENCOUNTER — Other Ambulatory Visit (HOSPITAL_COMMUNITY): Payer: 59

## 2019-12-04 ENCOUNTER — Other Ambulatory Visit: Payer: Self-pay | Admitting: Obstetrics and Gynecology

## 2019-12-04 ENCOUNTER — Other Ambulatory Visit (HOSPITAL_COMMUNITY): Payer: Self-pay | Admitting: Obstetrics and Gynecology

## 2019-12-04 DIAGNOSIS — Z7689 Persons encountering health services in other specified circumstances: Secondary | ICD-10-CM

## 2019-12-04 NOTE — Progress Notes (Signed)
Reminder text sent to patient to go for covid testing tomorrow. 

## 2019-12-05 ENCOUNTER — Other Ambulatory Visit (HOSPITAL_COMMUNITY)
Admission: RE | Admit: 2019-12-05 | Discharge: 2019-12-05 | Disposition: A | Discharge status: Home / Self Care | Payer: 59 | Source: Ambulatory Visit | Attending: Obstetrics and Gynecology | Admitting: Obstetrics and Gynecology

## 2019-12-05 DIAGNOSIS — Z01812 Encounter for preprocedural laboratory examination: Secondary | ICD-10-CM | POA: Diagnosis present

## 2019-12-05 DIAGNOSIS — Z20822 Contact with and (suspected) exposure to covid-19: Secondary | ICD-10-CM | POA: Diagnosis not present

## 2019-12-05 LAB — SARS CORONAVIRUS 2 (TAT 6-24 HRS): SARS Coronavirus 2: NEGATIVE

## 2019-12-07 ENCOUNTER — Ambulatory Visit (HOSPITAL_BASED_OUTPATIENT_CLINIC_OR_DEPARTMENT_OTHER)
Admission: RE | Admit: 2019-12-07 | Discharge: 2019-12-07 | Disposition: A | Discharge status: Home / Self Care | Payer: 59 | Attending: Obstetrics and Gynecology | Admitting: Obstetrics and Gynecology

## 2019-12-07 ENCOUNTER — Other Ambulatory Visit: Payer: Self-pay

## 2019-12-07 ENCOUNTER — Encounter (HOSPITAL_BASED_OUTPATIENT_CLINIC_OR_DEPARTMENT_OTHER)
Admission: RE | Disposition: A | Discharge status: Home / Self Care | Payer: Self-pay | Source: Home / Self Care | Attending: Obstetrics and Gynecology

## 2019-12-07 ENCOUNTER — Other Ambulatory Visit (HOSPITAL_COMMUNITY): Payer: Self-pay | Admitting: Obstetrics and Gynecology

## 2019-12-07 ENCOUNTER — Encounter (HOSPITAL_BASED_OUTPATIENT_CLINIC_OR_DEPARTMENT_OTHER): Payer: Self-pay | Admitting: Obstetrics and Gynecology

## 2019-12-07 ENCOUNTER — Ambulatory Visit (HOSPITAL_BASED_OUTPATIENT_CLINIC_OR_DEPARTMENT_OTHER): Payer: 59 | Admitting: Anesthesiology

## 2019-12-07 ENCOUNTER — Ambulatory Visit (HOSPITAL_COMMUNITY)
Admission: RE | Admit: 2019-12-07 | Discharge: 2019-12-07 | Disposition: A | Discharge status: Home / Self Care | Payer: 59 | Source: Ambulatory Visit | Attending: Obstetrics and Gynecology | Admitting: Obstetrics and Gynecology

## 2019-12-07 DIAGNOSIS — Z7689 Persons encountering health services in other specified circumstances: Secondary | ICD-10-CM

## 2019-12-07 DIAGNOSIS — O021 Missed abortion: Secondary | ICD-10-CM | POA: Diagnosis not present

## 2019-12-07 DIAGNOSIS — Z3A14 14 weeks gestation of pregnancy: Secondary | ICD-10-CM | POA: Insufficient documentation

## 2019-12-07 DIAGNOSIS — Z885 Allergy status to narcotic agent status: Secondary | ICD-10-CM | POA: Insufficient documentation

## 2019-12-07 DIAGNOSIS — Z87891 Personal history of nicotine dependence: Secondary | ICD-10-CM | POA: Insufficient documentation

## 2019-12-07 DIAGNOSIS — Z886 Allergy status to analgesic agent status: Secondary | ICD-10-CM | POA: Insufficient documentation

## 2019-12-07 HISTORY — PX: DILATION AND EVACUATION: SHX1459

## 2019-12-07 HISTORY — PX: OPERATIVE ULTRASOUND: SHX5996

## 2019-12-07 LAB — TYPE AND SCREEN
ABO/RH(D): A POS
Antibody Screen: NEGATIVE

## 2019-12-07 LAB — ABO/RH: ABO/RH(D): A POS

## 2019-12-07 LAB — CBC
HCT: 35.5 % — ABNORMAL LOW (ref 36.0–46.0)
Hemoglobin: 12.3 g/dL (ref 12.0–15.0)
MCH: 31.1 pg (ref 26.0–34.0)
MCHC: 34.6 g/dL (ref 30.0–36.0)
MCV: 89.9 fL (ref 80.0–100.0)
Platelets: 298 10*3/uL (ref 150–400)
RBC: 3.95 MIL/uL (ref 3.87–5.11)
RDW: 12.5 % (ref 11.5–15.5)
WBC: 7.5 10*3/uL (ref 4.0–10.5)
nRBC: 0 % (ref 0.0–0.2)

## 2019-12-07 SURGERY — DILATION AND EVACUATION, UTERUS
Anesthesia: Monitor Anesthesia Care | Site: Uterus

## 2019-12-07 MED ORDER — CARBOPROST TROMETHAMINE 250 MCG/ML IM SOLN
INTRAMUSCULAR | Status: AC
  Filled 2019-12-07: qty 1

## 2019-12-07 MED ORDER — ONDANSETRON HCL 4 MG/2ML IJ SOLN
INTRAMUSCULAR | Status: AC
  Filled 2019-12-07: qty 2

## 2019-12-07 MED ORDER — CHLOROPROCAINE HCL 1 % IJ SOLN
INTRAMUSCULAR | Status: DC | PRN
  Administered 2019-12-07: 10 mL

## 2019-12-07 MED ORDER — FENTANYL CITRATE (PF) 100 MCG/2ML IJ SOLN
INTRAMUSCULAR | Status: AC
  Filled 2019-12-07: qty 2

## 2019-12-07 MED ORDER — CHLOROPROCAINE HCL 1 % IJ SOLN
INTRAMUSCULAR | Status: AC
  Filled 2019-12-07: qty 30

## 2019-12-07 MED ORDER — PROPOFOL 10 MG/ML IV BOLUS
INTRAVENOUS | Status: AC
  Filled 2019-12-07: qty 20

## 2019-12-07 MED ORDER — DEXAMETHASONE SODIUM PHOSPHATE 10 MG/ML IJ SOLN
INTRAMUSCULAR | Status: DC | PRN
  Administered 2019-12-07: 4 mg via INTRAVENOUS

## 2019-12-07 MED ORDER — MIDAZOLAM HCL 2 MG/2ML IJ SOLN
INTRAMUSCULAR | Status: AC
  Filled 2019-12-07: qty 2

## 2019-12-07 MED ORDER — LACTATED RINGERS IV SOLN: INTRAVENOUS | Status: DC

## 2019-12-07 MED ORDER — FENTANYL CITRATE (PF) 100 MCG/2ML IJ SOLN
INTRAMUSCULAR | Status: DC | PRN
  Administered 2019-12-07: 100 ug via INTRAVENOUS

## 2019-12-07 MED ORDER — LIDOCAINE HCL (CARDIAC) PF 100 MG/5ML IV SOSY
PREFILLED_SYRINGE | INTRAVENOUS | Status: DC | PRN
  Administered 2019-12-07: 100 mg via INTRAVENOUS

## 2019-12-07 MED ORDER — HYDROMORPHONE HCL 2 MG PO TABS
2.0000 mg | ORAL_TABLET | ORAL | 0 refills | Status: DC | PRN
Start: 2019-12-07 — End: 2020-09-26

## 2019-12-07 MED ORDER — PROPOFOL 10 MG/ML IV BOLUS
INTRAVENOUS | Status: DC | PRN
  Administered 2019-12-07: 200 mg via INTRAVENOUS

## 2019-12-07 MED ORDER — DEXTROSE 5 % IV SOLN
200.0000 mg | INTRAVENOUS | Status: AC
Start: 2019-12-07 — End: 2019-12-07
  Administered 2019-12-07: 200 mg via INTRAVENOUS

## 2019-12-07 MED ORDER — MIDAZOLAM HCL 5 MG/5ML IJ SOLN
INTRAMUSCULAR | Status: DC | PRN
  Administered 2019-12-07 (×2): 2 mg via INTRAVENOUS

## 2019-12-07 MED ORDER — SCOPOLAMINE 1 MG/3DAYS TD PT72
MEDICATED_PATCH | TRANSDERMAL | Status: AC
  Filled 2019-12-07: qty 1

## 2019-12-07 MED ORDER — ONDANSETRON HCL 4 MG/2ML IJ SOLN
INTRAMUSCULAR | Status: DC | PRN
  Administered 2019-12-07: 4 mg via INTRAVENOUS

## 2019-12-07 MED ORDER — DEXAMETHASONE SODIUM PHOSPHATE 10 MG/ML IJ SOLN
INTRAMUSCULAR | Status: AC
  Filled 2019-12-07: qty 1

## 2019-12-07 MED ORDER — SCOPOLAMINE 1 MG/3DAYS TD PT72
1.0000 | MEDICATED_PATCH | TRANSDERMAL | Status: DC
  Administered 2019-12-07: 1.5 mg via TRANSDERMAL

## 2019-12-07 MED ORDER — SODIUM CHLORIDE 0.9 % IV SOLN
INTRAVENOUS | Status: AC
  Filled 2019-12-07 (×2): qty 100

## 2019-12-07 MED ORDER — MISOPROSTOL 100 MCG PO TABS
ORAL_TABLET | ORAL | Status: DC | PRN
  Administered 2019-12-07: 100 ug

## 2019-12-07 MED ORDER — MISOPROSTOL 200 MCG PO TABS
ORAL_TABLET | ORAL | Status: AC
  Filled 2019-12-07: qty 5

## 2019-12-07 MED ORDER — OXYTOCIN 10 UNIT/ML IJ SOLN
INTRAMUSCULAR | Status: AC
  Filled 2019-12-07: qty 1

## 2019-12-07 MED ORDER — POVIDONE-IODINE 10 % EX SWAB: 2.0000 "application " | Freq: Once | CUTANEOUS | Status: DC

## 2019-12-07 MED ORDER — METHYLERGONOVINE MALEATE 0.2 MG/ML IJ SOLN
INTRAMUSCULAR | Status: AC
  Filled 2019-12-07: qty 1

## 2019-12-07 MED ORDER — LIDOCAINE 2% (20 MG/ML) 5 ML SYRINGE
INTRAMUSCULAR | Status: AC
  Filled 2019-12-07: qty 5

## 2019-12-07 SURGICAL SUPPLY — 25 items
CATH ROBINSON RED A/P 14FR (CATHETERS) ×2 IMPLANT
DECANTER SPIKE VIAL GLASS SM (MISCELLANEOUS) ×2 IMPLANT
FILTER UTR ASPR ASSEMBLY (MISCELLANEOUS) IMPLANT
GLOVE BIOGEL PI IND STRL 6.5 (GLOVE) ×1 IMPLANT
GLOVE BIOGEL PI IND STRL 7.0 (GLOVE) ×1 IMPLANT
GLOVE BIOGEL PI INDICATOR 6.5 (GLOVE) ×1
GLOVE BIOGEL PI INDICATOR 7.0 (GLOVE) ×1
GLOVE SURG SS PI 6.5 STRL IVOR (GLOVE) ×2 IMPLANT
GOWN STRL REUS W/TWL LRG LVL3 (GOWN DISPOSABLE) ×4 IMPLANT
HIBICLENS CHG 4% 4OZ BTL (MISCELLANEOUS) ×2 IMPLANT
HOSE CONNECTING 18IN BERKELEY (TUBING) IMPLANT
KIT BERKELEY 1ST TRI 3/8 NO TR (MISCELLANEOUS) ×2 IMPLANT
KIT BERKELEY 1ST TRIMESTER 3/8 (MISCELLANEOUS) IMPLANT
NS IRRIG 1000ML POUR BTL (IV SOLUTION) ×2 IMPLANT
PACK VAGINAL MINOR WOMEN LF (CUSTOM PROCEDURE TRAY) ×2 IMPLANT
PAD OB MATERNITY 4.3X12.25 (PERSONAL CARE ITEMS) ×2 IMPLANT
PAD PREP 24X48 CUFFED NSTRL (MISCELLANEOUS) ×2 IMPLANT
SET BERKELEY SUCTION TUBING (SUCTIONS) ×2 IMPLANT
SLEEVE SCD COMPRESS KNEE MED (MISCELLANEOUS) ×2 IMPLANT
TOWEL GREEN STERILE FF (TOWEL DISPOSABLE) ×4 IMPLANT
VACURETTE 10 RIGID CVD (CANNULA) IMPLANT
VACURETTE 6 ASPIR F TIP BERK (CANNULA) IMPLANT
VACURETTE 7MM CVD STRL WRAP (CANNULA) IMPLANT
VACURETTE 8 RIGID CVD (CANNULA) IMPLANT
VACURETTE 9 RIGID CVD (CANNULA) IMPLANT

## 2019-12-07 NOTE — H&P (Signed)
Krista Lee is an 34 y.o. female P0 with 14 weeks missed abortion here for scheduled D&E. Patient with prenatal care at [facility] complicated by high risk NIPS for Trisomy 21. Patient reports doing well and denies cramping or vaginal bleeding.   Pertinent Gynecological History: Menses: regular every month without intermenstrual spotting Contraception: none DES exposure: denies Blood transfusions: none Sexually transmitted diseases: no past history Last pap: normal Date: 01/2019 OB History: G1, P0   Menstrual History: Patient's last menstrual period was 08/23/2019 (exact date).    Past Medical History:  Diagnosis Date  . ADD (attention deficit disorder)   . Allergy    no meds  . Anemia    as adolescent  . Anxiety   . Depression   . GERD (gastroesophageal reflux disease)    doesn't take any meds for this, diet controlled  . Headache(784.0)   . Heart murmur    as a child - no problems  . History of bronchitis    as a teenager, no problems as adult  . History of migraine    can't remember when the last one was  . Insomnia    no meds  . Panic attacks     Past Surgical History:  Procedure Laterality Date  . CHOLECYSTECTOMY  10/22/2011   Procedure: LAPAROSCOPIC CHOLECYSTECTOMY;  Surgeon: Shelly Rubenstein, MD;  Location: [facility] OR;  Service: General;  Laterality: N/A;  . HYSTEROSCOPY WITH RESECTOSCOPE N/A 04/28/2013   Procedure:  HYSTEROSCOPY ;  Surgeon: Genia Del, MD;  Location: [facility] ORS;  Service: Gynecology;  Laterality: N/A;  . LAPAROSCOPY N/A 04/28/2013   Procedure: LAPAROSCOPY DIAGNOSTIC;  Surgeon: Genia Del, MD;  Location: [facility] ORS;  Service: Gynecology;  Laterality: N/A;  2 hrs. total  . WISDOM TOOTH EXTRACTION  42yrs ago    Family History  Problem Relation Age of Onset  . Hypertension Mother   . Diverticulitis Mother   . Hyperlipidemia Mother   . Arthritis Father   . Rheum arthritis Paternal Grandmother     Social History:  reports that she quit  smoking about 4 months ago. Her smoking use included cigarettes. She has a 4.00 pack-year smoking history. She quit smokeless tobacco use about 8 years ago. She reports previous alcohol use of about 14.0 standard drinks of alcohol per week. She reports that she does not use drugs.  Allergies:  Allergies  Allergen Reactions  . Ibuprofen Other (See Comments)    Childhood reaction of high fever  . Percocet [Oxycodone-Acetaminophen] Nausea Only    Pt can take regular tylenol but in combination with codeine products is makes her very sick to her stomach.    Medications Prior to Admission  Medication Sig Dispense Refill Last Dose  . acetaminophen (TYLENOL) 500 MG tablet Take 1,000 mg by mouth every 6 (six) hours as needed for moderate pain.   Past Week at Unknown time  . cetirizine (ZYRTEC) 10 MG tablet Take 10 mg by mouth daily.   Past Month at Unknown time  . Multiple Vitamins-Minerals (MULTIVITAMIN WITH MINERALS) tablet Take 1 tablet by mouth daily.   Past Month at Unknown time  . Prenatal Vit-Fe Fumarate-FA (MULTIVITAMIN-PRENATAL) 27-0.8 MG TABS tablet Take 1 tablet by mouth daily at 12 noon.   Past Month at Unknown time  . Probiotic Product (PROBIOTIC-10 PO) Take by mouth.   12/06/2019 at Unknown time  . Turmeric (QC TUMERIC COMPLEX) 500 MG CAPS Take by mouth.    Past Month at Unknown time  .  VITAMIN D PO Take by mouth.    Past Month at Unknown time    Review of Systems See pertinent in HPI. All other systems reviewed and non contributory Blood pressure 111/67, pulse 74, temperature 98.3 F (36.8 C), temperature source Oral, resp. rate 16, height 5\' 6"  (1.676 m), weight 61.1 kg, last menstrual period 08/23/2019, SpO2 100 %. Physical Exam GENERAL: Well-developed, well-nourished female in no acute distress.  LUNGS: Clear to auscultation bilaterally.  HEART: Regular rate and rhythm. ABDOMEN: Soft, nontender, nondistended. No organomegaly. PELVIC: Deferred to OR EXTREMITIES: No cyanosis,  clubbing, or edema, 2+ distal pulses.  Results for orders placed or performed during the hospital encounter of 12/07/19 (from the past 24 hour(s))  CBC     Status: Abnormal   Collection Time: 12/07/19  8:15 AM  Result Value Ref Range   WBC 7.5 4.0 - 10.5 K/uL   RBC 3.95 3.87 - 5.11 MIL/uL   Hemoglobin 12.3 12.0 - 15.0 g/dL   HCT 14/06/21 (L) 36 - 46 %   MCV 89.9 80.0 - 100.0 fL   MCH 31.1 26.0 - 34.0 pg   MCHC 34.6 30.0 - 36.0 g/dL   RDW 38.1 82.9 - 93.7 %   Platelets 298 150 - 400 K/uL   nRBC 0.0 0.0 - 0.2 %  Type and screen [name]     Status: None   Collection Time: 12/07/19  8:15 AM  Result Value Ref Range   ABO/RH(D) A POS    Antibody Screen NEG    Sample Expiration      12/10/2019,2359 Performed at [facility], [address] [place], [place]   ABO/Rh     Status: None   Collection Time: 12/07/19  9:45 AM  Result Value Ref Range   ABO/RH(D)      A POS Performed at [facility], [address], [place], [place]     No results found.  Assessment/Plan: 34 yo G1P0 with [redacted]w[redacted]d missed abortion her for D&E - Risks, benefits and alternatives were explained including but not limited to risks of bleeding, infection, uterine perforation and damage to adjacent organs. - Patient verbalized understanding and all questions were answered  Krista Lee 12/07/2019, 10:06 AM

## 2019-12-07 NOTE — Transfer of Care (Signed)
Immediate Anesthesia Transfer of Care Note  Patient: Krista Lee  Procedure(s) Performed: DILATATION AND EVACUATION (N/A ) CHROMOSOME STUDIES (N/A ) OPERATIVE ULTRASOUND (N/A )  Patient Location: PACU  Anesthesia Type:General  Level of Consciousness: awake, alert , oriented and patient cooperative  Airway & Oxygen Therapy: Patient Spontanous Breathing and Patient connected to face mask oxygen  Post-op Assessment: Report given to RN, Post -op Vital signs reviewed and stable and Patient moving all extremities X 4  Post vital signs: Reviewed and stable  Last Vitals:  Vitals Value Taken Time  BP    Temp    Pulse    Resp 13 12/07/19 1122  SpO2    Vitals shown include unvalidated device data.  Last Pain:  Vitals:   12/07/19 0823  TempSrc: Oral  PainSc: 0-No pain         Complications: No complications documented.

## 2019-12-07 NOTE — Anesthesia Procedure Notes (Signed)
Procedure Name: LMA Insertion Date/Time: 12/07/2019 10:43 AM Performed by: Shanon Payor, CRNA Pre-anesthesia Checklist: Patient identified, Emergency Drugs available, Suction available, Patient being monitored and Timeout performed Patient Re-evaluated:Patient Re-evaluated prior to induction Oxygen Delivery Method: Circle system utilized Preoxygenation: Pre-oxygenation with 100% oxygen Induction Type: IV induction LMA: LMA inserted LMA Size: 4.0 Number of attempts: 1 Placement Confirmation: positive ETCO2 and breath sounds checked- equal and bilateral Dental Injury: Teeth and Oropharynx as per pre-operative assessment

## 2019-12-07 NOTE — OR Nursing (Signed)
Spoke with Lattie Haw in Pathology at [facility].  She is saving the Products of Conception for the patient, for later pick up, at the patient's request.

## 2019-12-07 NOTE — Anesthesia Preprocedure Evaluation (Addendum)
Anesthesia Evaluation  Patient identified by MRN, date of birth, ID band Patient awake    Reviewed: Allergy & Precautions, NPO status , Patient's Chart, lab work & pertinent test results  Airway Mallampati: II  TM Distance: >3 FB Neck ROM: Full    Dental no notable dental hx.    Pulmonary Patient abstained from smoking., former smoker,    Pulmonary exam normal breath sounds clear to auscultation       Cardiovascular Exercise Tolerance: Good negative cardio ROS Normal cardiovascular exam Rhythm:Regular Rate:Normal     Neuro/Psych  Headaches, PSYCHIATRIC DISORDERS Anxiety Depression    GI/Hepatic Neg liver ROS, GERD  ,  Endo/Other  negative endocrine ROS  Renal/GU negative Renal ROS  negative genitourinary   Musculoskeletal negative musculoskeletal ROS (+)   Abdominal   Peds  Hematology  (+) anemia ,   Anesthesia Other Findings   Reproductive/Obstetrics Missed Ab                           Anesthesia Physical Anesthesia Plan  ASA: II  Anesthesia Plan: MAC   Post-op Pain Management:    Induction: Intravenous  PONV Risk Score and Plan: 2 and Propofol infusion, TIVA and Treatment may vary due to age or medical condition  Airway Management Planned: Natural Airway and Simple Face Mask  Additional Equipment:   Intra-op Plan:   Post-operative Plan:   Informed Consent: I have reviewed the patients History and Physical, chart, labs and discussed the procedure including the risks, benefits and alternatives for the proposed anesthesia with the patient or authorized representative who has indicated his/her understanding and acceptance.       Plan Discussed with: CRNA, Anesthesiologist and Surgeon  Anesthesia Plan Comments: (Propofol gtt. MAC. Natural airway. GA/LMA as backup plan. Norton Blizzard, MD  )       Anesthesia Quick Evaluation

## 2019-12-07 NOTE — Discharge Instructions (Signed)
Post Anesthesia Home Care Instructions  Activity: Get plenty of rest for the remainder of the day. A responsible individual must stay with you for 24 hours following the procedure.  For the next 24 hours, DO NOT: -Drive a car -Advertising copywriter -Drink alcoholic beverages -Take any medication unless instructed by your physician -Make any legal decisions or sign important papers.  Meals: Start with liquid foods such as gelatin or soup. Progress to regular foods as tolerated. Avoid greasy, spicy, heavy foods. If nausea and/or vomiting occur, drink only clear liquids until the nausea and/or vomiting subsides. Call your physician if vomiting continues.  Special Instructions/Symptoms: Your throat may feel dry or sore from the anesthesia or the breathing tube placed in your throat during surgery. If this causes discomfort, gargle with warm salt water. The discomfort should disappear within 24 hours.  If you had a scopolamine patch placed behind your ear for the management of post- operative nausea and/or vomiting:  1. The medication in the patch is effective for 72 hours, after which it should be removed.  Wrap patch in a tissue and discard in the trash. Wash hands thoroughly with soap and water. 2. You may remove the patch earlier than 72 hours if you experience unpleasant side effects which may include dry mouth, dizziness or visual disturbances. 3. Avoid touching the patch. Wash your hands with soap and water after contact with the patch.        Dilation and Curettage or Vacuum Curettage  Dilation and curettage (D&C) and vacuum curettage are minor procedures. A D&C involves stretching (dilation) the cervix and scraping (curettage) the inside lining of the uterus (endometrium). During a D&C, tissue is gently scraped from the endometrium, starting from the top portion of the uterus down to the lowest part of the uterus (cervix). During a vacuum curettage, the lining and tissue in the uterus  are removed with the use of gentle suction. Curettage may be performed to either diagnose or treat a problem. As a diagnostic procedure, curettage is performed to examine tissues from the uterus. A diagnostic curettage may be done if you have:  Irregular bleeding in the uterus.  Bleeding with the development of clots.  Spotting between menstrual periods.  Prolonged menstrual periods or other abnormal bleeding.  Bleeding after menopause.  No menstrual period (amenorrhea).  A change in size and shape of the uterus.  Abnormal endometrial cells discovered during a Pap test. As a treatment procedure, curettage may be performed for the following reasons:  Removal of an IUD (intrauterine device).  Removal of retained placenta after giving birth.  Abortion.  Miscarriage.  Removal of endometrial polyps.  Removal of uncommon types of noncancerous lumps (fibroids). Tell a health care provider about:  Any allergies you have, including allergies to prescribed medicine or latex.  All medicines you are taking, including vitamins, herbs, eye drops, creams, and over-the-counter medicines. This is especially important if you take any blood-thinning medicine. Bring a list of all of your medicines to your appointment.  Any problems you or family members have had with anesthetic medicines.  Any blood disorders you have.  Any surgeries you have had.  Your medical history and any medical conditions you have.  Whether you are pregnant or may be pregnant.  Recent vaginal infections you have had.  Recent menstrual periods, bleeding problems you have had, and what form of birth control (contraception) you use. What are the risks? Generally, this is a safe procedure. However, problems may occur, including:  Infection.  Heavy vaginal bleeding.  Allergic reactions to medicines.  Damage to the cervix or other structures or organs.  Development of scar tissue (adhesions) inside the  uterus, which can cause abnormal amounts of menstrual bleeding. This may make it harder to get pregnant in the future.  A hole (perforation) or puncture in the uterine wall. This is rare. What happens before the procedure? Staying hydrated Follow instructions from your health care provider about hydration, which may include:  Up to 2 hours before the procedure - you may continue to drink clear liquids, such as water, clear fruit juice, black coffee, and plain tea. Eating and drinking restrictions Follow instructions from your health care provider about eating and drinking, which may include:  8 hours before the procedure - stop eating heavy meals or foods such as meat, fried foods, or fatty foods.  6 hours before the procedure - stop eating light meals or foods, such as toast or cereal.  6 hours before the procedure - stop drinking milk or drinks that contain milk.  2 hours before the procedure - stop drinking clear liquids. If your health care provider told you to take your medicine(s) on the day of your procedure, take them with only a sip of water. Medicines  Ask your health care provider about: ? Changing or stopping your regular medicines. This is especially important if you are taking diabetes medicines or blood thinners. ? Taking medicines such as aspirin and ibuprofen. These medicines can thin your blood. Do not take these medicines before your procedure if your health care provider instructs you not to.  You may be given antibiotic medicine to help prevent infection. General instructions  For 24 hours before your procedure, do not: ? Douche. ? Use tampons. ? Use medicines, creams, or suppositories in the vagina. ? Have sexual intercourse.  You may be given a pregnancy test on the day of the procedure.  Plan to have someone take you home from the hospital or clinic.  You may have a blood or urine sample taken.  If you will be going home right after the procedure, plan  to have someone with you for 24 hours. What happens during the procedure?  To reduce your risk of infection: ? Your health care team will wash or sanitize their hands. ? Your skin will be washed with soap.  An IV tube will be inserted into one of your veins.  You will be given one of the following: ? A medicine that numbs the area in and around the cervix (local anesthetic). ? A medicine to make you fall asleep (general anesthetic).  You will lie down on your back, with your feet in foot rests (stirrups).  The size and position of your uterus will be checked.  A lubricated instrument (speculum or Sims retractor) will be inserted into the back side of your vagina. The speculum will be used to hold apart the walls of your vagina so your health care provider can see your cervix.  A tool (tenaculum) will be attached to the lip of the cervix to stabilize it.  Your cervix will be softened and dilated. This may be done by: ? Taking a medicine. ? Having tapered dilators or thin rods (laminaria) or gradual widening instruments (tapered dilators) inserted into your cervix.  A small, sharp, curved instrument (curette) will be used to scrape a small amount of tissue or cells from the endometrium or cervical canal. In some cases, gentle suction is applied with the  curette. The curette will then be removed. The cells will be taken to a lab for testing. The procedure may vary among health care providers and hospitals. What happens after the procedure?  You may have mild cramping, backache, pain, and light bleeding or spotting. You may pass small blood clots from your vagina.  You may have to wear compression stockings. These stockings help to prevent blood clots and reduce swelling in your legs.  Your blood pressure, heart rate, breathing rate, and blood oxygen level will be monitored until the medicines you were given have worn off. Summary  Dilation and curettage (D&C) involves stretching  (dilation) the cervix and scraping (curettage) the inside lining of the uterus (endometrium).  After the procedure, you may have mild cramping, backache, pain, and light bleeding or spotting. You may pass small blood clots from your vagina.  Plan to have someone take you home from the hospital or clinic. This information is not intended to replace advice given to you by your health care provider. Make sure you discuss any questions you have with your health care provider. Document Revised: 11/30/2016 Document Reviewed: 09/04/2015 Elsevier Patient Education  2020 [employer].

## 2019-12-07 NOTE — Op Note (Addendum)
Krista Lee PROCEDURE DATE: 12/07/2019  PREOPERATIVE DIAGNOSIS: 14 week missed abortion. POSTOPERATIVE DIAGNOSIS: The same. PROCEDURE:     Dilation and Evacuation under ultrasound guidance SURGEON:  Dr. Catalina Antigua  INDICATIONS: 34 y.o. G1P0000with MAB at [redacted] weeks gestation, needing surgical completion.  Risks of surgery were discussed with the patient including but not limited to: bleeding which may require transfusion; infection which may require antibiotics; injury to uterus or surrounding organs;need for additional procedures including laparotomy or laparoscopy; possibility of intrauterine scarring which may impair future fertility; and other postoperative/anesthesia complications. Written informed consent was obtained.    FINDINGS:  A 14-week size midline uterus, moderate amounts of products of conception, specimen sent to pathology.  ANESTHESIA:    Monitored intravenous sedation, paracervical block. INTRAVENOUS FLUIDS:  1000 ml of LR ESTIMATED BLOOD LOSS:  100 ml. SPECIMENS:  Products of conception sent to pathology COMPLICATIONS:  None immediate.  PROCEDURE DETAILS:  The patient received intravenous antibiotics while in the preoperative area.  She was then taken to the operating room where general anesthesia was administered and was found to be adequate.  After an adequate timeout was performed, she was placed in the dorsal lithotomy position and examined; then prepped and draped in the sterile manner.   Her bladder was catheterized for an unmeasured amount of clear, yellow urine. A vaginal speculum was then placed in the patient's vagina and a single tooth tenaculum was applied to the anterior lip of the cervix.  A paracervical block using 0.5% Marcaine was administered. The cervix was gently dilated to accommodate a 14 mm suction curette that was gently advanced to the uterine fundus.  The suction device was then activated and curette slowly rotated to clear the uterus of products of  conception.  A sharp curettage was then performed to confirm complete emptying of the uterus. Intraoperative ultrasound revealed a thin endometrial stripe. There was minimal bleeding noted and the tenaculum removed with good hemostasis noted.   All instruments were removed from the patient's vagina. The patient tolerated the procedure well and was taken to the recovery area awake, and in stable condition. 1000 mcg of misoprostol was administered rectally to ensure minimal vaginal bleeding. Specimen collected for genetic testing  The patient will be discharged to home as per PACU criteria.  Routine postoperative instructions given.  She will follow up in the clinic in 2 weeks for postoperative evaluation.

## 2019-12-07 NOTE — Anesthesia Postprocedure Evaluation (Signed)
Anesthesia Post Note  Patient: Krista Lee  Procedure(s) Performed: DILATATION AND EVACUATION (N/A Uterus) CHROMOSOME STUDIES (N/A Uterus) OPERATIVE ULTRASOUND (N/A Uterus)     Patient location during evaluation: PACU Anesthesia Type: General Level of consciousness: awake and alert Pain management: pain level controlled Vital Signs Assessment: post-procedure vital signs reviewed and stable Respiratory status: spontaneous breathing, nonlabored ventilation and respiratory function stable Cardiovascular status: blood pressure returned to baseline and stable Postop Assessment: no apparent nausea or vomiting Anesthetic complications: no   No complications documented.  Last Vitals:  Vitals:   12/07/19 1139 12/07/19 1230  BP: 98/69 (!) 111/59  Pulse: 66   Resp: 17 16  Temp:  (!) 36.4 C  SpO2: 100% 100%    Last Pain:  Vitals:   12/07/19 1245  TempSrc:   PainSc: 0-No pain                 Mellody Dance

## 2019-12-08 ENCOUNTER — Encounter (HOSPITAL_BASED_OUTPATIENT_CLINIC_OR_DEPARTMENT_OTHER): Payer: Self-pay | Admitting: Obstetrics and Gynecology

## 2019-12-08 LAB — SURGICAL PATHOLOGY

## 2019-12-17 ENCOUNTER — Encounter: Payer: Self-pay | Admitting: Obstetrics and Gynecology

## 2019-12-21 ENCOUNTER — Encounter: Payer: 59 | Admitting: Obstetrics and Gynecology

## 2019-12-29 ENCOUNTER — Ambulatory Visit: Payer: 59

## 2020-01-11 ENCOUNTER — Ambulatory Visit (INDEPENDENT_AMBULATORY_CARE_PROVIDER_SITE_OTHER): Payer: 59 | Admitting: Obstetrics and Gynecology

## 2020-01-11 ENCOUNTER — Other Ambulatory Visit: Payer: Self-pay

## 2020-01-11 ENCOUNTER — Encounter: Payer: Self-pay | Admitting: Obstetrics and Gynecology

## 2020-01-11 VITALS — BP 116/77 | HR 76 | Wt 135.0 lb

## 2020-01-11 DIAGNOSIS — Z9889 Other specified postprocedural states: Secondary | ICD-10-CM

## 2020-01-11 NOTE — Progress Notes (Signed)
35 yo P0010 here for post op check s/p D&E on 12/6 for missed abortion. Patient reports doing well and is without any complaints. She reports a normal period on 01/10/20. She has returned to her regular activities  Past Medical History:  Diagnosis Date  . ADD (attention deficit disorder)   . Allergy    no meds  . Anemia    as adolescent  . Anxiety   . Depression   . GERD (gastroesophageal reflux disease)    doesn't take any meds for this, diet controlled  . Headache(784.0)   . Heart murmur    as a child - no problems  . History of bronchitis    as a teenager, no problems as adult  . History of migraine    can't remember when the last one was  . Insomnia    no meds  . Panic attacks    Past Surgical History:  Procedure Laterality Date  . CHOLECYSTECTOMY  10/22/2011   Procedure: LAPAROSCOPIC CHOLECYSTECTOMY;  Surgeon: Harl Bowie, MD;  Location: [facility];  Service: General;  Laterality: N/A;  . DILATION AND EVACUATION N/A 12/07/2019   Procedure: DILATATION AND EVACUATION;  Surgeon: Mora Bellman, MD;  Location: [facility];  Service: Gynecology;  Laterality: N/A;  NEEDS ULTRASOUND GUIDANCE ANORA KIT SENT   . HYSTEROSCOPY WITH RESECTOSCOPE N/A 04/28/2013   Procedure:  HYSTEROSCOPY ;  Surgeon: Princess Bruins, MD;  Location: [facility];  Service: Gynecology;  Laterality: N/A;  . LAPAROSCOPY N/A 04/28/2013   Procedure: LAPAROSCOPY DIAGNOSTIC;  Surgeon: Princess Bruins, MD;  Location: [facility];  Service: Gynecology;  Laterality: N/A;  2 hrs. total  . OPERATIVE ULTRASOUND N/A 12/07/2019   Procedure: OPERATIVE ULTRASOUND;  Surgeon: Mora Bellman, MD;  Location: [facility];  Service: Gynecology;  Laterality: N/A;  . WISDOM TOOTH EXTRACTION  20yr ago   Family History  Problem Relation Age of Onset  . Hypertension Mother   . Diverticulitis Mother   . Hyperlipidemia Mother   . Arthritis Father   . Rheum arthritis Paternal Grandmother    Social History    Tobacco Use  . Smoking status: Former Smoker    Packs/day: 0.50    Years: 8.00    Pack years: 4.00    Types: Cigarettes    Quit date: 08/2019    Years since quitting: 0.4  . Smokeless tobacco: Former [employer] developer   Quit date: 01/02/2011  Vaping Use  . Vaping Use: Former  Substance Use Topics  . Alcohol use: Not Currently    Alcohol/week: 14.0 standard drinks    Types: 14 Standard drinks or equivalent per week    Comment: last drink 6 weeks ago  . Drug use: No   ROS See pertinent in HPI. All other systems reviewed and negative  Blood pressure 116/77, pulse 76, weight 135 lb (61.2 kg), last menstrual period 01/10/2020, unknown if currently breastfeeding. GENERAL: Well-developed, well-nourished female in no acute distress.  ABDOMEN: Soft, nontender, nondistended. No organomegaly. EXTREMITIES: No cyanosis, clubbing, or edema, 2+ distal pulses.  A/P 35yo here for post op check s/p D&E - Reviewed pathology and genetic testing which confirms T21 - Patient is medically cleared to resume all activities of daily living - Patient is actively trying to conceive and was advised to continue prenatal vitamins - RTC prn

## 2020-01-11 NOTE — Progress Notes (Signed)
Pt states she is doing well. Did start what seems to be her cycle yesterday.

## 2020-02-15 ENCOUNTER — Ambulatory Visit: Payer: 59 | Admitting: Clinical

## 2020-02-25 ENCOUNTER — Telehealth: Payer: Self-pay

## 2020-02-25 NOTE — Telephone Encounter (Addendum)
Received VM from pt regarding an insurance issue with [place]. She needs an explanation of benefits from the provider  Lindsay's email and phone number given patient

## 2020-03-02 ENCOUNTER — Ambulatory Visit (INDEPENDENT_AMBULATORY_CARE_PROVIDER_SITE_OTHER): Payer: 59 | Admitting: Clinical

## 2020-03-02 DIAGNOSIS — F419 Anxiety disorder, unspecified: Secondary | ICD-10-CM | POA: Diagnosis not present

## 2020-03-07 ENCOUNTER — Ambulatory Visit: Payer: 59 | Admitting: Psychology

## 2020-03-14 ENCOUNTER — Ambulatory Visit: Payer: 59 | Admitting: Clinical

## 2020-03-24 ENCOUNTER — Ambulatory Visit (INDEPENDENT_AMBULATORY_CARE_PROVIDER_SITE_OTHER): Payer: 59 | Admitting: Clinical

## 2020-03-24 DIAGNOSIS — F419 Anxiety disorder, unspecified: Secondary | ICD-10-CM | POA: Diagnosis not present

## 2020-04-04 ENCOUNTER — Ambulatory Visit: Payer: 59 | Admitting: Psychology

## 2020-04-11 ENCOUNTER — Ambulatory Visit (INDEPENDENT_AMBULATORY_CARE_PROVIDER_SITE_OTHER): Payer: 59 | Admitting: Clinical

## 2020-04-11 DIAGNOSIS — F419 Anxiety disorder, unspecified: Secondary | ICD-10-CM

## 2020-04-21 ENCOUNTER — Ambulatory Visit: Payer: 59 | Admitting: Clinical

## 2020-04-26 ENCOUNTER — Other Ambulatory Visit: Payer: Self-pay | Admitting: Advanced Practice Midwife

## 2020-04-26 DIAGNOSIS — N979 Female infertility, unspecified: Secondary | ICD-10-CM

## 2020-04-26 MED ORDER — LETROZOLE 2.5 MG PO TABS: 2.5000 mg | ORAL_TABLET | Freq: Every day | ORAL | 3 refills | Status: AC

## 2020-05-02 ENCOUNTER — Ambulatory Visit: Payer: 59 | Admitting: Psychology

## 2020-05-02 ENCOUNTER — Ambulatory Visit (INDEPENDENT_AMBULATORY_CARE_PROVIDER_SITE_OTHER): Payer: 59 | Admitting: Clinical

## 2020-05-02 DIAGNOSIS — F419 Anxiety disorder, unspecified: Secondary | ICD-10-CM

## 2020-05-23 ENCOUNTER — Ambulatory Visit (INDEPENDENT_AMBULATORY_CARE_PROVIDER_SITE_OTHER): Payer: 59 | Admitting: Clinical

## 2020-05-23 DIAGNOSIS — F419 Anxiety disorder, unspecified: Secondary | ICD-10-CM

## 2020-06-13 ENCOUNTER — Ambulatory Visit (INDEPENDENT_AMBULATORY_CARE_PROVIDER_SITE_OTHER): Payer: 59 | Admitting: Clinical

## 2020-06-13 DIAGNOSIS — F419 Anxiety disorder, unspecified: Secondary | ICD-10-CM

## 2020-07-25 ENCOUNTER — Ambulatory Visit (INDEPENDENT_AMBULATORY_CARE_PROVIDER_SITE_OTHER): Payer: 59 | Admitting: Clinical

## 2020-07-25 DIAGNOSIS — F419 Anxiety disorder, unspecified: Secondary | ICD-10-CM | POA: Diagnosis not present

## 2020-08-22 ENCOUNTER — Ambulatory Visit: Payer: 59 | Admitting: Clinical

## 2020-08-22 ENCOUNTER — Telehealth: Payer: Self-pay

## 2020-08-22 NOTE — Telephone Encounter (Signed)
Pt called regarding brown spotting when wiping , no pain , no recent intercourse, notes using dildo on the outside but nothing has been inserted in the vaginal area.  Pt concerned wanted to know if she cld get sooner appt.  Pt is 4wks 2days I consulted with a provider pt is advised to monitor for now. If pain, cramping, clots and heavy period like bleeding pt will need evaluation at hospital  Pt voiced understanding.

## 2020-08-29 ENCOUNTER — Ambulatory Visit: Payer: 59 | Admitting: Clinical

## 2020-08-29 ENCOUNTER — Other Ambulatory Visit: Payer: Self-pay

## 2020-08-29 ENCOUNTER — Ambulatory Visit: Payer: Self-pay

## 2020-08-29 ENCOUNTER — Ambulatory Visit (INDEPENDENT_AMBULATORY_CARE_PROVIDER_SITE_OTHER): Payer: 59 | Admitting: Clinical

## 2020-08-29 VITALS — BP 131/79 | HR 79

## 2020-08-29 DIAGNOSIS — F419 Anxiety disorder, unspecified: Secondary | ICD-10-CM | POA: Diagnosis not present

## 2020-08-29 DIAGNOSIS — Z3200 Encounter for pregnancy test, result unknown: Secondary | ICD-10-CM

## 2020-08-29 NOTE — Progress Notes (Signed)
Krista Lee presents today for UPT. She has no unusual complaints and complains of nausea and fatigue. LMP: 07/23/2020    OBJECTIVE: Appears well, in no apparent distress.  OB History     Gravida  1   Para  0   Term  0   Preterm  0   AB  0   Living  0      SAB  0   IAB  0   Ectopic  0   Multiple  0   Live Births  0          Home UPT Result: positive In-Office UPT result: positive I have reviewed the patient's medical, obstetrical, social, and family histories, and medications.   ASSESSMENT: Positive pregnancy test  PLAN Prenatal care to be completed at: Patient requested further  work-up today regarding her possible pregnancy test. She had failed pregnancy and this has cause her to worry about this pregnancy and  Ectopic pregnancy. Will go ahead and order HCG today.

## 2020-08-30 ENCOUNTER — Telehealth: Payer: Self-pay

## 2020-08-30 LAB — BETA HCG QUANT (REF LAB): hCG Quant: 7480 m[IU]/mL

## 2020-08-30 NOTE — Telephone Encounter (Cosign Needed)
Patient has been informed of test results. She will return to clinic tomorrow for another hcg check. Patient does report some brown spotting and side pain. I have advise her to monitor her symptoms, if it get worse call us back or go MAU.Patient voice understanding at this time.

## 2020-08-31 ENCOUNTER — Other Ambulatory Visit: Payer: Self-pay

## 2020-08-31 ENCOUNTER — Other Ambulatory Visit: Payer: 59

## 2020-08-31 ENCOUNTER — Ambulatory Visit: Payer: 59

## 2020-08-31 DIAGNOSIS — Z3200 Encounter for pregnancy test, result unknown: Secondary | ICD-10-CM

## 2020-08-31 LAB — BETA HCG QUANT (REF LAB): hCG Quant: 12644 m[IU]/mL

## 2020-08-31 NOTE — Progress Notes (Signed)
beta

## 2020-09-19 ENCOUNTER — Ambulatory Visit (INDEPENDENT_AMBULATORY_CARE_PROVIDER_SITE_OTHER): Payer: 59 | Admitting: Clinical

## 2020-09-19 DIAGNOSIS — F419 Anxiety disorder, unspecified: Secondary | ICD-10-CM

## 2020-09-26 ENCOUNTER — Ambulatory Visit (INDEPENDENT_AMBULATORY_CARE_PROVIDER_SITE_OTHER): Payer: 59

## 2020-09-26 ENCOUNTER — Other Ambulatory Visit: Payer: Self-pay

## 2020-09-26 VITALS — BP 116/69 | HR 87 | Ht 66.0 in | Wt 131.9 lb

## 2020-09-26 DIAGNOSIS — Z3A09 9 weeks gestation of pregnancy: Secondary | ICD-10-CM

## 2020-09-26 DIAGNOSIS — Z3481 Encounter for supervision of other normal pregnancy, first trimester: Secondary | ICD-10-CM

## 2020-09-26 DIAGNOSIS — Z3491 Encounter for supervision of normal pregnancy, unspecified, first trimester: Secondary | ICD-10-CM | POA: Insufficient documentation

## 2020-09-26 DIAGNOSIS — Z3480 Encounter for supervision of other normal pregnancy, unspecified trimester: Secondary | ICD-10-CM | POA: Insufficient documentation

## 2020-09-26 NOTE — Progress Notes (Signed)
Patient will return in 1 week for genetic testing and all New OB labs prior to New OB visit with provider.  Patient will need: Genetic Testing OB Panel Urine Culture Hep C

## 2020-09-26 NOTE — Progress Notes (Signed)
Patient was assessed and managed by nursing staff during this encounter. I have reviewed the chart and agree with the documentation and plan. I have also made any necessary editorial changes.  Catalina Antigua, MD 09/26/2020 12:51 PM

## 2020-09-26 NOTE — Progress Notes (Signed)
New OB Intake  I connected with  Krista Lee on 09/26/20 at 10:15 AM EDT by in person. Video Visit and verified that I am speaking with the correct person using two identifiers. Nurse is located at [facility] and pt is located at [place].  I discussed the limitations, risks, security and privacy concerns of performing an evaluation and management service by telephone and the availability of in person appointments. I also discussed with the patient that there may be a patient responsible charge related to this service. The patient expressed understanding and agreed to proceed.  I explained I am completing New OB Intake today. We discussed her EDD of 04/29/20 that is based on LMP of 07/23/20. Pt is G2/P1010. I reviewed her allergies, medications, Medical/Surgical/OB history, and appropriate screenings. I informed her of [facility] services. Based on history, this is a/an  pregnancy uncomplicated .   Patient Active Problem List   Diagnosis Date Noted   Missed abortion    Supervision of normal first pregnancy 09/28/2019   Female infertility 08/17/2019   Symptomatic cholelithiasis 09/25/2011   Biliary dyskinesia 09/25/2011   Abdominal pain 08/24/2011    Concerns addressed today  Delivery Plans:  Plans to deliver at [facility].   MyChart/Babyscripts MyChart access verified. I explained pt will have some visits in office and some virtually. Babyscripts instructions given and order placed. Patient verifies receipt of registration text/e-mail. Account successfully created and app downloaded.  Blood Pressure Cuff  Patient has private insurance; instructed to purchase blood pressure cuff and bring to first prenatal appt. Explained after first prenatal appt pt will check weekly and document in Babyscripts.  Weight scale: Patient does have weight scale. Weight scale ordered for patient to pick up form [facility].   Anatomy US Explained first scheduled [place] will be around 19 weeks. Dating and viability  scan performed today.  Labs Discussed Avelina Laine genetic screening with patient. Would like both Panorama and Horizon drawn at new OB visit. Routine prenatal labs needed.  Covid Vaccine Patient has covid vaccine.   Mother/ Baby Dyad Candidate?    If yes, offer as possibility  Informed patient of [facility] website  and placed link in her AVS.   Social Determinants of Health Food Insecurity: Patient denies food insecurity. WIC Referral: Patient is not interested in referral to [facility].  Transportation: Patient denies transportation needs. Childcare: Discussed no children allowed at ultrasound appointments. Offered childcare services; patient declines childcare services at this time.  Send link to Pregnancy Navigators   Placed OB Box on problem list and updated  First visit review I reviewed new OB appt with pt. I explained she will have a pelvic exam, ob bloodwork with genetic screening, and PAP smear. Explained pt will be seen by Sharen Counter at first visit; encounter routed to appropriate provider. Explained that patient will be seen by pregnancy navigator following visit with provider. [facility] information placed in AVS.   Hamilton Capri, RN 09/26/2020  10:07 AM

## 2020-10-03 ENCOUNTER — Ambulatory Visit: Payer: 59

## 2020-10-03 ENCOUNTER — Other Ambulatory Visit: Payer: 59

## 2020-10-03 ENCOUNTER — Other Ambulatory Visit: Payer: Self-pay

## 2020-10-03 ENCOUNTER — Other Ambulatory Visit (HOSPITAL_COMMUNITY)
Admission: RE | Admit: 2020-10-03 | Discharge: 2020-10-03 | Disposition: A | Discharge status: Home / Self Care | Payer: 59 | Source: Ambulatory Visit | Attending: Obstetrics and Gynecology | Admitting: Obstetrics and Gynecology

## 2020-10-03 DIAGNOSIS — N898 Other specified noninflammatory disorders of vagina: Secondary | ICD-10-CM

## 2020-10-03 DIAGNOSIS — O26891 Other specified pregnancy related conditions, first trimester: Secondary | ICD-10-CM | POA: Diagnosis present

## 2020-10-03 DIAGNOSIS — Z3481 Encounter for supervision of other normal pregnancy, first trimester: Secondary | ICD-10-CM

## 2020-10-03 NOTE — Progress Notes (Signed)
Patient was assessed and managed by nursing staff during this encounter. I have reviewed the chart and agree with the documentation and plan. I have also made any necessary editorial changes.  Catalina Antigua, MD 10/03/2020 11:08 AM

## 2020-10-03 NOTE — Progress Notes (Signed)
Pt here for lab visit only  Patient requested NOB labs prior to NOB visit.

## 2020-10-03 NOTE — Progress Notes (Signed)
SUBJECTIVE:  Pt was scheduled for lab only visit. During visit, 35 y.o. female complains of green vaginal discharge for 1 week. Denies abnormal vaginal bleeding or significant pelvic pain or fever. No UTI symptoms. Denies history of known exposure to STD.  Patient's last menstrual period was 07/23/2020.  OBJECTIVE:  She appears well, afebrile. Urine dipstick: not done.  ASSESSMENT:  Vaginal Discharge     PLAN:  GC, chlamydia, trichomonas, BVAG, CVAG probe sent to lab. Treatment: To be determined once lab results are reviewed by the provider ROV prn if symptoms persist or worsen.  Keep upcoming NOB visit

## 2020-10-04 LAB — CERVICOVAGINAL ANCILLARY ONLY
Bacterial Vaginitis (gardnerella): NEGATIVE
Candida Glabrata: NEGATIVE
Candida Vaginitis: NEGATIVE
Chlamydia: NEGATIVE
Comment: NEGATIVE
Comment: NEGATIVE
Comment: NEGATIVE
Comment: NEGATIVE
Comment: NEGATIVE
Comment: NORMAL
Neisseria Gonorrhea: NEGATIVE
Trichomonas: NEGATIVE

## 2020-10-04 LAB — CBC/D/PLT+RPR+RH+ABO+RUBIGG...
Antibody Screen: NEGATIVE
Basophils Absolute: 0.1 10*3/uL (ref 0.0–0.2)
Basos: 1 %
EOS (ABSOLUTE): 0.2 10*3/uL (ref 0.0–0.4)
Eos: 2 %
HCV Ab: <0.1 s/co ratio (ref 0.0–0.9)
HIV Screen 4th Generation wRfx: NONREACTIVE
Hematocrit: 39.4 % (ref 34.0–46.6)
Hemoglobin: 13.1 g/dL (ref 11.1–15.9)
Hepatitis B Surface Ag: NEGATIVE
Immature Grans (Abs): 0 10*3/uL (ref 0.0–0.1)
Immature Granulocytes: 0 %
Lymphocytes Absolute: 2.3 10*3/uL (ref 0.7–3.1)
Lymphs: 26 %
MCH: 30.3 pg (ref 26.6–33.0)
MCHC: 33.2 g/dL (ref 31.5–35.7)
MCV: 91 fL (ref 79–97)
Monocytes Absolute: 0.6 10*3/uL (ref 0.1–0.9)
Monocytes: 7 %
Neutrophils Absolute: 5.7 10*3/uL (ref 1.4–7.0)
Neutrophils: 64 %
Platelets: 311 10*3/uL (ref 150–450)
RBC: 4.33 x10E6/uL (ref 3.77–5.28)
RDW: 12.1 % (ref 11.7–15.4)
RPR Ser Ql: NONREACTIVE
Rh Factor: POSITIVE
Rubella Antibodies, IGG: 3.84 index (ref >0.99)
WBC: 8.8 10*3/uL (ref 3.4–10.8)

## 2020-10-04 LAB — HCV INTERPRETATION

## 2020-10-10 ENCOUNTER — Encounter: Payer: Self-pay | Admitting: Obstetrics and Gynecology

## 2020-10-12 ENCOUNTER — Encounter: Payer: Self-pay | Admitting: Obstetrics and Gynecology

## 2020-10-17 ENCOUNTER — Ambulatory Visit (INDEPENDENT_AMBULATORY_CARE_PROVIDER_SITE_OTHER): Payer: 59 | Admitting: Clinical

## 2020-10-17 ENCOUNTER — Ambulatory Visit (INDEPENDENT_AMBULATORY_CARE_PROVIDER_SITE_OTHER): Payer: 59 | Admitting: Advanced Practice Midwife

## 2020-10-17 ENCOUNTER — Other Ambulatory Visit: Payer: Self-pay

## 2020-10-17 VITALS — BP 100/70 | HR 79 | Wt 132.0 lb

## 2020-10-17 DIAGNOSIS — F419 Anxiety disorder, unspecified: Secondary | ICD-10-CM | POA: Diagnosis not present

## 2020-10-17 DIAGNOSIS — O09299 Supervision of pregnancy with other poor reproductive or obstetric history, unspecified trimester: Secondary | ICD-10-CM

## 2020-10-17 DIAGNOSIS — Z3481 Encounter for supervision of other normal pregnancy, first trimester: Secondary | ICD-10-CM

## 2020-10-17 DIAGNOSIS — O021 Missed abortion: Secondary | ICD-10-CM

## 2020-10-17 DIAGNOSIS — O09291 Supervision of pregnancy with other poor reproductive or obstetric history, first trimester: Secondary | ICD-10-CM

## 2020-10-17 DIAGNOSIS — Z3A12 12 weeks gestation of pregnancy: Secondary | ICD-10-CM

## 2020-10-17 NOTE — Progress Notes (Signed)
Spotting off and on 2 weeks ago and several times in early pregnancy. OB labs already completed with the exception of OB Urine. Pap 01/12/19

## 2020-10-17 NOTE — Progress Notes (Signed)
Subjective:   Krista Lee is a 35 y.o. G2P0010 at 41w2dby LMP being seen today for her first obstetrical visit.  Her obstetrical history is significant for  second trimester loss with trisomy 21  and has Abdominal pain; Symptomatic cholelithiasis; Biliary dyskinesia; Female infertility; Supervision of normal first pregnancy; Missed abortion; and Encounter for supervision of normal pregnancy in first trimester on their problem list.. Patient does intend to breast feed. Pregnancy history fully reviewed.  Patient reports no complaints.  HISTORY: OB History  Gravida Para Term Preterm AB Living  2 0 0 0 1 0  SAB IAB Ectopic Multiple Live Births  1 0 0 0 0    # Outcome Date GA Lbr Len/2nd Weight Sex Delivery Anes PTL Lv  2 Current           1 SAB 11/2019           Past Medical History:  Diagnosis Date   ADD (attention deficit disorder)    Allergy    no meds   Anemia    as adolescent   Anxiety    Depression    GERD (gastroesophageal reflux disease)    doesn't take any meds for this, diet controlled   Headache(784.0)    Heart murmur    as a child - no problems   History of bronchitis    as a teenager, no problems as adult   History of migraine    can't remember when the last one was   Insomnia    no meds   Panic attacks    Past Surgical History:  Procedure Laterality Date   CHOLECYSTECTOMY  10/22/2011   Procedure: LAPAROSCOPIC CHOLECYSTECTOMY;  Surgeon: DHarl Bowie MD;  [facility]  Service: General;  Laterality: N/A;   DILATION AND EVACUATION N/A 12/07/2019   Procedure: DILATATION AND EVACUATION;  Surgeon: CMora Bellman MD;  [facility]  Service: Gynecology;  Laterality: N/A;  NEEDS ULTRASOUND GUIDANCE ANORA KIT SENT    HYSTEROSCOPY WITH RESECTOSCOPE N/A 04/28/2013   Procedure:  HYSTEROSCOPY ;  Surgeon: MPrincess Bruins MD;  [facility];  Service: Gynecology;  Laterality: N/A;   LAPAROSCOPY N/A 04/28/2013   Procedure:  LAPAROSCOPY DIAGNOSTIC;  Surgeon: MPrincess Bruins MD;  [facility];  Service: Gynecology;  Laterality: N/A;  2 hrs. total   OPERATIVE ULTRASOUND N/A 12/07/2019   Procedure: OPERATIVE ULTRASOUND;  Surgeon: CMora Bellman MD;  [facility]  Service: Gynecology;  Laterality: N/A;   WISDOM TOOTH EXTRACTION  617[address]   Family History  Problem Relation Age of Onset   Hypertension Mother    Diverticulitis Mother    Hyperlipidemia Mother    Arthritis Father    Rheum arthritis Paternal Grandmother    Social History   Tobacco Use   Smoking status: Former    Packs/day: 0.50    Years: 8.00    Pack years: 4.00    Types: Cigarettes    Quit date: 08/2019    Years since quitting: 1.2   Smokeless tobacco: Former    Quit date: 01/02/2011  Vaping Use   Vaping Use: Former  Substance Use Topics   Alcohol use: Not Currently    Alcohol/week: 14.0 standard drinks    Types: 14 Standard drinks or equivalent per week    Comment: not since confirmed pregnancy   Drug use: No   Allergies  Allergen Reactions   Citrus Nausea And Vomiting    Oranges  Ibuprofen Other (See Comments)    Childhood reaction of high fever   Percocet [Oxycodone-Acetaminophen] Nausea Only    Pt can take regular tylenol but in combination with codeine products is makes her very sick to her stomach.   Current Outpatient Medications on File Prior to Visit  Medication Sig Dispense Refill   acetaminophen (TYLENOL) 500 MG tablet Take 1,000 mg by mouth every 6 (six) hours as needed for moderate pain.     cetirizine (ZYRTEC) 10 MG tablet Take 10 mg by mouth daily.     famotidine (PEPCID) 10 MG tablet Take 10 mg by mouth 2 (two) times daily.     Prenatal Vit-Fe Fumarate-FA (MULTIVITAMIN-PRENATAL) 27-0.8 MG TABS tablet Take 1 tablet by mouth daily at 12 noon.     Probiotic Product (PROBIOTIC-10 PO) Take by mouth.     VITAMIN D PO Take by mouth.      Turmeric 500 MG CAPS Take by mouth.      No  current facility-administered medications on file prior to visit.     Indications for ASA therapy (per uptodate) One of the following: Previous pregnancy with preeclampsia, especially early onset and with an adverse outcome No Multifetal gestation No Chronic hypertension No Type 1 or 2 diabetes mellitus No Chronic kidney disease No Autoimmune disease (antiphospholipid syndrome, systemic lupus erythematosus) No   Two or more of the following: Nulliparity No Obesity (body mass index >30 kg/m2) No Family history of preeclampsia in mother or sister No Age ?35 years No Sociodemographic characteristics (African American race, low socioeconomic level) No Personal risk factors (eg, previous pregnancy with low birth weight or small for gestational age infant, previous adverse pregnancy outcome [eg, stillbirth], interval >10 years between pregnancies) No.  Second trimester loss in the setting of T21.   Indications for early 1 hour GTT (per uptodate)  BMI >25 (>23 in Asian women) AND one of the following  Gestational diabetes mellitus in a previous pregnancy No Glycated hemoglobin ?5.7 percent (39 mmol/mol), impaired glucose tolerance, or impaired fasting glucose on previous testing No First-degree relative with diabetes No High-risk race/ethnicity (eg, African American, Latino, Native American, [place] American, Pacific Islander) No History of cardiovascular disease No Hypertension or on therapy for hypertension No High-density lipoprotein cholesterol level <35 mg/dL (0.90 mmol/L) and/or a triglyceride level >250 mg/dL (2.82 mmol/L) No Polycystic ovary syndrome No Physical inactivity No Other clinical condition associated with insulin resistance (eg, severe obesity, acanthosis nigricans) No Previous birth of an infant weighing ?4000 g No Previous stillbirth of unknown cause No Exam   Vitals:   10/17/20 1311  BP: 100/70  Pulse: 79  Weight: 132 lb (59.9 kg)   Fetal Heart Rate (bpm):  150  Uterus:     Pelvic Exam: Perineum: no hemorrhoids, normal perineum   Vulva: normal external genitalia, no lesions   Vagina:  normal mucosa, normal discharge   Cervix: no lesions and normal, pap smear done.    Adnexa: normal adnexa and no mass, fullness, tenderness   Bony Pelvis: average  System: General: well-developed, well-nourished female in no acute distress   Breast:  normal appearance, no masses or tenderness   Skin: normal coloration and turgor, no rashes   Neurologic: oriented, normal, negative, normal mood   Extremities: normal strength, tone, and muscle mass, ROM of all joints is normal   HEENT PERRLA, extraocular movement intact and sclera clear, anicteric   Mouth/Teeth mucous membranes moist, pharynx normal without lesions and dental hygiene good   Neck supple  and no masses   Cardiovascular: regular rate and rhythm   Respiratory:  no respiratory distress, normal breath sounds   Abdomen: soft, non-tender; bowel sounds normal; no masses,  no organomegaly     Assessment:   Pregnancy: G2P0010 Patient Active Problem List   Diagnosis Date Noted   Encounter for supervision of normal pregnancy in first trimester 09/26/2020   Missed abortion    Supervision of normal first pregnancy 09/28/2019   Female infertility 08/17/2019   Symptomatic cholelithiasis 09/25/2011   Biliary dyskinesia 09/25/2011   Abdominal pain 08/24/2011     Plan:  1. Encounter for supervision of normal first pregnancy in first trimester --Anticipatory guidance about next visits/weeks of pregnancy given. --Normal pap 01/12/19 --Next visit in 4 weeks  2. Down syndrome in child of prior pregnancy, currently pregnant --NIPS this pregnancy is normal  3. History of pregnancy loss in prior pregnancy, currently pregnant in first trimester --In setting of T21, no other risk factors  4. [redacted] weeks gestation of pregnancy     Initial labs drawn. Continue prenatal vitamins. Reviewed genetic screening  results   Ultrasound discussed; fetal anatomic survey: requested. Problem list reviewed and updated. The nature of [facility] with multiple MDs and other Advanced Practice Providers was explained to patient; also emphasized that residents, students are part of our team. Routine obstetric precautions reviewed. Return in about 3 weeks (around 11/07/2020).   Fatima Blank, CNM 10/18/20 5:42 PM

## 2020-11-02 ENCOUNTER — Other Ambulatory Visit: Payer: Self-pay | Admitting: Advanced Practice Midwife

## 2020-11-02 DIAGNOSIS — O09299 Supervision of pregnancy with other poor reproductive or obstetric history, unspecified trimester: Secondary | ICD-10-CM

## 2020-11-07 ENCOUNTER — Ambulatory Visit: Payer: 59

## 2020-11-07 ENCOUNTER — Encounter: Payer: 59 | Admitting: Advanced Practice Midwife

## 2020-11-14 ENCOUNTER — Ambulatory Visit (INDEPENDENT_AMBULATORY_CARE_PROVIDER_SITE_OTHER): Payer: 59 | Admitting: Advanced Practice Midwife

## 2020-11-14 ENCOUNTER — Ambulatory Visit: Payer: 59 | Attending: Advanced Practice Midwife

## 2020-11-14 ENCOUNTER — Other Ambulatory Visit: Payer: Self-pay

## 2020-11-14 VITALS — BP 106/64 | HR 79 | Wt 135.0 lb

## 2020-11-14 DIAGNOSIS — Z3481 Encounter for supervision of other normal pregnancy, first trimester: Secondary | ICD-10-CM

## 2020-11-14 DIAGNOSIS — Z3A16 16 weeks gestation of pregnancy: Secondary | ICD-10-CM

## 2020-11-14 DIAGNOSIS — O09299 Supervision of pregnancy with other poor reproductive or obstetric history, unspecified trimester: Secondary | ICD-10-CM

## 2020-11-14 NOTE — Progress Notes (Signed)
   PRENATAL VISIT NOTE  Subjective:  Krista Lee is a 35 y.o. G2P0010 at 105w2d being seen today for ongoing prenatal care.  She is currently monitored for the following issues for this low-risk pregnancy and has Abdominal pain; Symptomatic cholelithiasis; Biliary dyskinesia; Female infertility; Missed abortion; and Encounter for supervision of normal pregnancy in first trimester on their problem list.  Patient reports no complaints.  Contractions: Not present. Vag. Bleeding: None.  Movement: Present. Denies leaking of fluid.   The following portions of the patient's history were reviewed and updated as appropriate: allergies, current medications, past family history, past medical history, past social history, past surgical history and problem list.   Objective:   Vitals:   11/14/20 1610  BP: 106/64  Pulse: 79  Weight: 135 lb (61.2 kg)    Fetal Status: Fetal Heart Rate (bpm): 138   Movement: Present     General:  Alert, oriented and cooperative. Patient is in no acute distress.  Skin: Skin is warm and dry. No rash noted.   Cardiovascular: Normal heart rate noted  Respiratory: Normal respiratory effort, no problems with respiration noted  Abdomen: Soft, gravid, appropriate for gestational age.  Pain/Pressure: Absent     Pelvic: Cervical exam deferred        Extremities: Normal range of motion.  Edema: None  Mental Status: Normal mood and affect. Normal behavior. Normal judgment and thought content.   Assessment and Plan:  Pregnancy: G2P0010 at [redacted]w[redacted]d 1. Encounter for supervision of other normal pregnancy in first trimester --Anticipatory guidance about next visits/weeks of pregnancy given. --Questions answered about circumcision --Pt with suspected Ehlers-Danlos but no diagnosis. She saw genetic counselor and may be referred to adult genetic counseling. Testing will likely not be available during the pregnancy.  Will message MFM to ask about testing during pregnancy and consult with  family practice to see if pt can be seen and diagnosed clinically.   --Next visit in 4 weeks  2. [redacted] weeks gestation of pregnancy   Preterm labor symptoms and general obstetric precautions including but not limited to vaginal bleeding, contractions, leaking of fluid and fetal movement were reviewed in detail with the patient. Please refer to After Visit Summary for other counseling recommendations.   No follow-ups on file.  Future Appointments  Date Time Provider Department Center  11/28/2020 10:00 AM Mendelson, Hulda Humphrey [facility] None  12/05/2020  8:00 AM [facility] NURSE [facility]  12/05/2020  8:15 AM [facility] US2 [facility]  12/05/2020 10:35 AM Leftwich-Kirby, Wilmer Floor, CNM [facility] None    Sharen Counter, CNM

## 2020-11-15 ENCOUNTER — Other Ambulatory Visit: Payer: Self-pay | Admitting: Obstetrics and Gynecology

## 2020-11-15 DIAGNOSIS — O26879 Cervical shortening, unspecified trimester: Secondary | ICD-10-CM

## 2020-11-16 NOTE — Progress Notes (Signed)
Name: Krista Lee Indication: Previous pregnancy with suspected Trisomy 21  DOB: April 24, 1985 Age: 35 y.o.   EDC: 04/29/2021 LMP: 07/23/2020 Referring Provider:  Hurshel Party, CNM  EGA: [redacted]w[redacted]d Genetic Counselor: Teena Dunk, MS, CGC  OB Hx: G2P0010 Date of Appointment: 11/14/2020  Accompanied by: Father of the current pregnancy, Caryn Bee Face to Face Time: 60 Minutes   Previous Testing Completed: Krista Lee previously completed Non-Invasive Prenatal Screening (NIPS) in this pregnancy (scanned into Epic under the Media tab). The result is low risk, consistent with a female fetus. This screening significantly reduces the risk that the current pregnancy has Down syndrome, Trisomy 21, Trisomy 13, Monosomy X, and Triploidy, however, the risk is not zero given the limitations of NIPS. Additionally, there are many genetic conditions that cannot be detected by NIPS.  Krista Lee completed carrier screening in 2021 for 14 conditions. She screened to not be a carrier for all 14 conditions on the expanded panel. A repeat carrier screen was completed for Krista Lee in 2022 for 4 conditions that she previously screened to not be a carrier for. All results are available in Epic under the Media tab. A negative result on carrier screening reduces the likelihood of being a carrier, however, does not entirely rule out the possibility.   Medical & Family History:  Reports she takes prenatal vitamins and probiotics. Reports some spotting in this pregnancy. Denies personal history of diabetes, high blood pressure, thyroid conditions, and seizures. Denies significant bleeding, infections, and fevers in this pregnancy. Denies using tobacco, alcohol, or street drugs in this pregnancy. A pedigree was created at Krista Lee's previous genetic counseling appointment (scanned into Epic under Media tab). The family history was confirmed with the couple in today's session. Maternal ethnicity reported as [place], [place], [place], [place], and  [place] and paternal ethnicity reported as [place] and [place]. The couple denies Ashkenazi Jewish ancestry.     Genetic Counseling:   Previous Pregnancy with suspected Trisomy 21. In her previous pregnancy, Krista Lee completed a Non-Invasive Prenatal Screen (NIPS) which showed that the pregnancy had a high risk to be affected with Trisomy 21 (Down syndrome). Krista Lee completed genetic counseling regarding this result and declined amniocentesis for prenatal diagnosis. She opted to follow the pregnancy via routine ultrasounds and fetal echocardiogram with genetic testing postnatally. Unfortunately, Krista Lee had a spontaneous second trimester pregnancy loss. During today's genetic counseling visit we reviewed with Krista Lee that she completed NIPS in the current pregnancy and the result is low risk. The low risk result significantly reduces the chance that her baby has Down syndrome, Trisomy 29, Trisomy 13, Monosomy X, and Triploidy. We offered Krista Lee amniocentesis for prenatal diagnosis given that NIPS is only a screening test. Krista Lee declined amniocentesis and shared with genetic counseling that she feels comfortable with the low risk NIPS result.   Discussion about Krista Lee Krista Lee Danlos Syndrome (EDS). 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 Specifically, Krista Lee reports the following symptoms: double jointed hypermobility in fingers elbows hyperextend skin is stretchy, thin, soft, and bruises easily (reports no skin tears) constipation since childhood pelvic floor disorder labral tear in hip (not related to a sports injury) Krista Lee reports she has not had any dislocations and that she is not anemic. She reports that her sister has similar features but her sister's gastrointestinal issues are not as significant.  EDS is the term used to describe a group of connective tissue disorders that affect the  skin, bones,  blood vessels, and other organs/tissues throughout the body. According to the 2017 international classification of EDS, there are 13 distinct subtypes of EDS. Individuals with EDS often have certain features in common, such as joint hypermobility, hypotonia, delayed motor skills, joint dislocations, chronic pain, skin abnormalities, easy bruising, and abnormal scarring. Each subtype is associated with additional distinct features. The most common type of EDS is Hypermobile EDS (hEDS) which has a complex set of clinical diagnostic criteria. Many individuals do not fully meet diagnostic criteria for hEDS but experience hypermobility that interferes with their life in some way. While EDS is associated with at least 20 different genes, there has not yet been a genetic cause identified for hEDS. Many forms of EDS, including hEDS, are inherited in an autosomal dominant fashion. This means that individuals with EDS symptoms are often seen in every generation of a family. When an individual has EDS following an autosomal dominant pattern of inheritance, they have a 1 in 2 (50%) chance of passing it on to each of their children.   Since Krista Lee has not had an evaluation by a medical geneticist for EDS we discussed that she could be referred to an adult genetics clinic to determine whether or not she meets clinical criteria for EDS. If Krista Lee was diagnosed with EDS, she could have up to a 1 in 2 (50%) chance of having a child with EDS. This would be true for each of her pregnancies. Genetic counseling offered to reach out to local adult genetics clinics regarding a referral for Krista Lee which she expressed interest in. They may or may not recommend genetic testing for EDS based on clinical her clinical evaluation.  Birth Defects. All babies have approximately a 3-5% risk for a birth defect and a majority of these defects cannot be detected through the screening or diagnostic testing listed below. Ultrasound may detect some  birth defects, but it may not detect all birth defects. About half of pregnancies with Down syndrome do not show any soft markers on ultrasound. A normal ultrasound does not guarantee a healthy pregnancy.   Thank you for sharing in the care of [place] with [place].  Please do not hesitate to contact us if you have any questions.  Teena Dunk, MS, [facility]

## 2020-11-19 ENCOUNTER — Encounter: Payer: Self-pay | Admitting: Advanced Practice Midwife

## 2020-11-21 ENCOUNTER — Ambulatory Visit (INDEPENDENT_AMBULATORY_CARE_PROVIDER_SITE_OTHER): Payer: 59 | Admitting: Advanced Practice Midwife

## 2020-11-21 ENCOUNTER — Other Ambulatory Visit: Payer: Self-pay

## 2020-11-21 VITALS — BP 101/65 | HR 85 | Wt 138.0 lb

## 2020-11-21 DIAGNOSIS — O368120 Decreased fetal movements, second trimester, not applicable or unspecified: Secondary | ICD-10-CM

## 2020-11-21 DIAGNOSIS — Z20828 Contact with and (suspected) exposure to other viral communicable diseases: Secondary | ICD-10-CM

## 2020-11-21 DIAGNOSIS — Z3481 Encounter for supervision of other normal pregnancy, first trimester: Secondary | ICD-10-CM

## 2020-11-21 DIAGNOSIS — Z3A17 17 weeks gestation of pregnancy: Secondary | ICD-10-CM

## 2020-11-21 MED ORDER — OSELTAMIVIR PHOSPHATE 75 MG PO CAPS: 75.0000 mg | ORAL_CAPSULE | Freq: Two times a day (BID) | ORAL | 0 refills | Status: AC

## 2020-11-21 NOTE — Progress Notes (Signed)
   PRENATAL VISIT NOTE  Subjective:  Krista Lee is a 35 y.o. G2P0010 at [redacted]w[redacted]d being seen today for ongoing prenatal care.  She is currently monitored for the following issues for this low-risk pregnancy and has Abdominal pain; Symptomatic cholelithiasis; Biliary dyskinesia; Female infertility; Missed abortion; and Encounter for supervision of normal pregnancy in first trimester on their problem list.  Patient reports  decreased fetal movement .  Contractions: Not present. Vag. Bleeding: None.   . Denies leaking of fluid.   The following portions of the patient's history were reviewed and updated as appropriate: allergies, current medications, past family history, past medical history, past social history, past surgical history and problem list.   Objective:   Vitals:   11/21/20 1443  BP: 101/65  Pulse: 85  Weight: 138 lb (62.6 kg)    Fetal Status: Fetal Heart Rate (bpm): 148         General:  Alert, oriented and cooperative. Patient is in no acute distress.  Skin: Skin is warm and dry. No rash noted.   Cardiovascular: Normal heart rate noted  Respiratory: Normal respiratory effort, no problems with respiration noted  Abdomen: Soft, gravid, appropriate for gestational age.  Pain/Pressure: Absent     Pelvic: Cervical exam deferred        Extremities: Normal range of motion.  Edema: Trace  Mental Status: Normal mood and affect. Normal behavior. Normal judgment and thought content.   Assessment and Plan:  Pregnancy: G2P0010 at [redacted]w[redacted]d  1. Encounter for supervision of other normal pregnancy in first trimester --Anticipatory guidance about next visits/weeks of pregnancy given. --Next visit as scheduled 12/5  2. Decreased fetal movements in second trimester, single or unspecified fetus --Pt in for work in appointment today due to feeling no fetal movement for 3 days. She was feeling movement before this.  She has hx of 15 week loss. --FHT wnl today, reassurance provided.  3. [redacted]  weeks gestation of pregnancy   4. Exposure to influenza --Pt husband with flu-like symptoms. She has no symptoms and his test was negative for COVID and flu.  He is taking Tamiflu, however as the symptoms are flu-like and testing is not 100%. --Pt was interested in postexposure prophylaxis but her husband started having symptoms 4 days ago so past recommended 48 hours. --Rx for Tamiflu sent, however, for if pt develops symptoms. She should start Tamiflu BID x 5 days at first onset of flu symptoms. --Reasons to seek medical care for URI symptoms reviewed  - oseltamivir (TAMIFLU) 75 MG capsule; Take 1 capsule (75 mg total) by mouth 2 (two) times daily for 5 days.  Dispense: 10 capsule; Refill: 0  Preterm labor symptoms and general obstetric precautions including but not limited to vaginal bleeding, contractions, leaking of fluid and fetal movement were reviewed in detail with the patient. Please refer to After Visit Summary for other counseling recommendations.   No follow-ups on file.  Future Appointments  Date Time Provider Department Center  11/28/2020 10:00 AM Mendelson, Hulda Humphrey [facility] None  12/05/2020  8:00 AM [facility] NURSE [facility]  12/05/2020  8:15 AM [facility] US2 [facility]  12/05/2020 10:35 AM Leftwich-Kirby, Wilmer Floor, CNM [facility] None  01/09/2021  1:10 PM Leftwich-Kirby, Wilmer Floor, CNM [facility] None    Sharen Counter, CNM

## 2020-11-28 ENCOUNTER — Ambulatory Visit (INDEPENDENT_AMBULATORY_CARE_PROVIDER_SITE_OTHER): Payer: 59 | Admitting: Clinical

## 2020-11-28 DIAGNOSIS — F419 Anxiety disorder, unspecified: Secondary | ICD-10-CM | POA: Diagnosis not present

## 2020-12-01 ENCOUNTER — Other Ambulatory Visit: Payer: Self-pay | Admitting: Advanced Practice Midwife

## 2020-12-01 ENCOUNTER — Encounter: Payer: Self-pay | Admitting: Genetics

## 2020-12-01 DIAGNOSIS — O26892 Other specified pregnancy related conditions, second trimester: Secondary | ICD-10-CM

## 2020-12-01 DIAGNOSIS — R102 Pelvic and perineal pain: Secondary | ICD-10-CM

## 2020-12-05 ENCOUNTER — Ambulatory Visit (HOSPITAL_BASED_OUTPATIENT_CLINIC_OR_DEPARTMENT_OTHER): Payer: 59 | Admitting: Obstetrics

## 2020-12-05 ENCOUNTER — Ambulatory Visit (INDEPENDENT_AMBULATORY_CARE_PROVIDER_SITE_OTHER): Payer: 59 | Admitting: Advanced Practice Midwife

## 2020-12-05 ENCOUNTER — Ambulatory Visit: Payer: 59 | Admitting: *Deleted

## 2020-12-05 ENCOUNTER — Other Ambulatory Visit: Payer: Self-pay

## 2020-12-05 ENCOUNTER — Encounter: Payer: Self-pay | Admitting: Physical Therapy

## 2020-12-05 ENCOUNTER — Encounter: Payer: Self-pay | Admitting: *Deleted

## 2020-12-05 ENCOUNTER — Ambulatory Visit: Payer: 59 | Attending: Advanced Practice Midwife | Admitting: Physical Therapy

## 2020-12-05 ENCOUNTER — Other Ambulatory Visit: Payer: Self-pay | Admitting: *Deleted

## 2020-12-05 ENCOUNTER — Ambulatory Visit: Payer: 59 | Attending: Advanced Practice Midwife

## 2020-12-05 VITALS — BP 98/53 | HR 79 | Wt 137.8 lb

## 2020-12-05 VITALS — BP 90/51 | HR 69

## 2020-12-05 DIAGNOSIS — Z363 Encounter for antenatal screening for malformations: Secondary | ICD-10-CM | POA: Insufficient documentation

## 2020-12-05 DIAGNOSIS — O26872 Cervical shortening, second trimester: Secondary | ICD-10-CM | POA: Diagnosis not present

## 2020-12-05 DIAGNOSIS — O26879 Cervical shortening, unspecified trimester: Secondary | ICD-10-CM | POA: Diagnosis present

## 2020-12-05 DIAGNOSIS — O26892 Other specified pregnancy related conditions, second trimester: Secondary | ICD-10-CM | POA: Insufficient documentation

## 2020-12-05 DIAGNOSIS — Z3481 Encounter for supervision of other normal pregnancy, first trimester: Secondary | ICD-10-CM

## 2020-12-05 DIAGNOSIS — Z3A19 19 weeks gestation of pregnancy: Secondary | ICD-10-CM | POA: Diagnosis present

## 2020-12-05 DIAGNOSIS — Z3A29 29 weeks gestation of pregnancy: Secondary | ICD-10-CM

## 2020-12-05 DIAGNOSIS — O09292 Supervision of pregnancy with other poor reproductive or obstetric history, second trimester: Secondary | ICD-10-CM

## 2020-12-05 DIAGNOSIS — R102 Pelvic and perineal pain: Secondary | ICD-10-CM

## 2020-12-05 DIAGNOSIS — Z3686 Encounter for antenatal screening for cervical length: Secondary | ICD-10-CM | POA: Insufficient documentation

## 2020-12-05 DIAGNOSIS — Q796 Ehlers-Danlos syndrome, unspecified: Secondary | ICD-10-CM

## 2020-12-05 DIAGNOSIS — Z8279 Family history of other congenital malformations, deformations and chromosomal abnormalities: Secondary | ICD-10-CM | POA: Diagnosis present

## 2020-12-05 DIAGNOSIS — O4402 Placenta previa specified as without hemorrhage, second trimester: Secondary | ICD-10-CM

## 2020-12-05 DIAGNOSIS — O09299 Supervision of pregnancy with other poor reproductive or obstetric history, unspecified trimester: Secondary | ICD-10-CM

## 2020-12-05 DIAGNOSIS — M6281 Muscle weakness (generalized): Secondary | ICD-10-CM | POA: Insufficient documentation

## 2020-12-05 DIAGNOSIS — R252 Cramp and spasm: Secondary | ICD-10-CM | POA: Insufficient documentation

## 2020-12-05 NOTE — Progress Notes (Signed)
MFM Note  Krista Lee was seen for a detailed fetal anatomy scan due to possible Ehlers-Danlos syndrome (EDS).  The patient reports that she has never been formally diagnosed with EDS.  She does have some signs associated with EDS such as stretchy skin, hypermobility-she is able to bend her fingers all the way back, and easy bruisability.  She reports that she has had surgeries in the past and does not have a history of poor wound healing.  She has already met with our genetic counselor regarding the possibility of having EDS.  She had a second trimester miscarriage in her last pregnancy.  The Anora test obtained after delivery indicated that the fetus had Down syndrome.  She denies any other significant past medical history and denies any problems in her current pregnancy.    She had a cell free DNA test earlier in her pregnancy which indicated a low risk for trisomy 80, 73, and 13. A female fetus is predicted.   She was informed that the fetal growth and amniotic fluid level were appropriate for her gestational age.   There were no obvious fetal anomalies noted on today's ultrasound exam.  The patient was informed that anomalies may be missed due to technical limitations. If the fetus is in a suboptimal position or maternal habitus is increased, visualization of the fetus in the maternal uterus may be impaired.  A transvaginal ultrasound performed today due to possible EDS showed a cervical length of 3.48 cm long without any signs of funneling.  The patient was reassured that based on the normal cervical length, her risk of a preterm birth is low at this time.    A low-lying placenta/marginal placenta previa was noted today.  She was reassured that the placenta previa will most likely resolve later in her pregnancy.  The following were discussed with the patient today:  Possible Ehlers-Danlos syndrome and pregnancy  Krista Lee was advised that based on the signs she displays, there could be a  good possibility that she may have the hypermobile type Ehlers-Danlos syndrome.  Genetic testing for the hypermobile type of EDS is not available.  She was advised that most women with the hypermobile type of EDS can anticipate a good outcome in their pregnancy.  She does not display any signs of the vascular, more serious type of EDS.  She was advised that Ehlers-Danlos syndrome in pregnancy has been associated with an increased risk of preterm labor, preterm delivery, and preterm rupture of membranes.  As her cervical length today is within normal limits, her risk of preterm delivery is low at this time.  At the time of delivery, women with Ehlers-Danlos syndrome may be at risk for a rapid or precipitous delivery and at increased risk for perineal lacerations due to tissue fragility. These women may also be at increased risk for postpartum hemorrhage.   Due to possible Ehlers-Danlos syndrome, a maternal echocardiogram should ordered prior to delivery to assess her cardiac function.  This test may be performed after the new year due to insurance reasons.  She should have an anesthesia consult prior to delivery.    We will continue to follow her with monthly growth ultrasounds.  A growth scan and cervical length measurement was scheduled in 4 weeks.  The patient stated that all of her questions have been answered today.  A total of 30 minutes was spent counseling and coordinating the care for this patient.  Greater than 50% of the time was spent in direct face-to-face  contact.  Recommendations:  Maternal echocardiogram Monthly growth ultrasounds  Anesthesia consult prior to delivery

## 2020-12-05 NOTE — Progress Notes (Signed)
   PRENATAL VISIT NOTE  Subjective:  Krista Lee is a 35 y.o. G2P0010 at [redacted]w[redacted]d being seen today for ongoing prenatal care.  She is currently monitored for the following issues for this low-risk pregnancy and has Abdominal pain; Symptomatic cholelithiasis; Biliary dyskinesia; Female infertility; Missed abortion; and Encounter for supervision of normal pregnancy in first trimester on their problem list.  Patient reports  pain in hips, worse after standing/walking. Pain is intermittent and manageable, pt desires PT before it worsens .  Contractions: Not present. Vag. Bleeding: None.  Movement: Present. Denies leaking of fluid.   The following portions of the patient's history were reviewed and updated as appropriate: allergies, current medications, past family history, past medical history, past social history, past surgical history and problem list.   Objective:   Vitals:   12/05/20 1027  BP: (!) 98/53  Pulse: 79  Weight: 137 lb 12.8 oz (62.5 kg)    Fetal Status: Fetal Heart Rate (bpm): 140   Movement: Present     General:  Alert, oriented and cooperative. Patient is in no acute distress.  Skin: Skin is warm and dry. No rash noted.   Cardiovascular: Normal heart rate noted  Respiratory: Normal respiratory effort, no problems with respiration noted  Abdomen: Soft, gravid, appropriate for gestational age.  Pain/Pressure: Absent     Pelvic: Cervical exam deferred        Extremities: Normal range of motion.     Mental Status: Normal mood and affect. Normal behavior. Normal judgment and thought content.   Assessment and Plan:  Pregnancy: G2P0010 at [redacted]w[redacted]d 1. Encounter for supervision of other normal pregnancy in first trimester --Anticipatory guidance about next visits/weeks of pregnancy given. --FH at umbilicus as expected --Next appt in 4 weeks  2. Pelvic pain affecting pregnancy in second trimester, antepartum --Pt had PT before pregnancy, desires to resume before pelvic/hip pain  worsens. --Pain is mostly in left hip, not relieved by yoga, stretches, massage or heat  3. [redacted] weeks gestation of pregnancy   Preterm labor symptoms and general obstetric precautions including but not limited to vaginal bleeding, contractions, leaking of fluid and fetal movement were reviewed in detail with the patient. Please refer to After Visit Summary for other counseling recommendations.   Return in about 4 weeks (around 01/02/2021).  Future Appointments  Date Time Provider Department Center  01/09/2021  1:10 PM Hurshel Party, CNM [facility] None  01/09/2021  3:15 PM [facility] NURSE [facility]  01/09/2021  3:30 PM [facility] US3 [facility]    Sharen Counter, CNM

## 2020-12-05 NOTE — Therapy (Signed)
[facility] [address] [place], [place], [address] Phone: [phone]   Fax:  [phone]  Physical Therapy Evaluation  Patient Details  Name: Krista Lee MRN: [id number] Date of Birth: 28-Apr-1985 Referring Provider (PT): Elvera Maria, CNM  Encounter Date: 12/05/2020   PT End of Session - 12/05/20 1717     Visit Number 1    Date for PT Re-Evaluation 02/27/21    Authorization Type [employer]    PT Start Time 1401    PT Stop Time 1275    PT Time Calculation (min) 45 min    Activity Tolerance Patient tolerated treatment well    Behavior During Therapy [facility] for tasks assessed/performed             Past Medical History:  Diagnosis Date   ADD (attention deficit disorder)    Allergy    no meds   Anemia    as adolescent   Anxiety    Depression    GERD (gastroesophageal reflux disease)    doesn't take any meds for this, diet controlled   Headache(784.0)    Heart murmur    as a child - no problems   History of bronchitis    as a teenager, no problems as adult   History of migraine    can't remember when the last one was   Insomnia    no meds   Panic attacks     Past Surgical History:  Procedure Laterality Date   CHOLECYSTECTOMY  10/22/2011   Procedure: LAPAROSCOPIC CHOLECYSTECTOMY;  Surgeon: Harl Bowie, MD;  Location: [facility];  Service: General;  Laterality: N/A;   DILATION AND EVACUATION N/A 12/07/2019   Procedure: DILATATION AND EVACUATION;  Surgeon: Mora Bellman, MD;  Location: [facility];  Service: Gynecology;  Laterality: N/A;  NEEDS ULTRASOUND GUIDANCE ANORA KIT SENT    HYSTEROSCOPY WITH RESECTOSCOPE N/A 04/28/2013   Procedure:  HYSTEROSCOPY ;  Surgeon: Princess Bruins, MD;  Location: [facility];  Service: Gynecology;  Laterality: N/A;   LAPAROSCOPY N/A 04/28/2013   Procedure: LAPAROSCOPY DIAGNOSTIC;  Surgeon: Princess Bruins, MD;  Location: [facility];  Service:  Gynecology;  Laterality: N/A;  2 hrs. total   OPERATIVE ULTRASOUND N/A 12/07/2019   Procedure: OPERATIVE ULTRASOUND;  Surgeon: Mora Bellman, MD;  Location: [facility];  Service: Gynecology;  Laterality: N/A;   WISDOM TOOTH EXTRACTION  86yr ago    There were no vitals filed for this visit.    Subjective Assessment - 12/05/20 1407     Subjective groin pain that goes through to the gluteals and I am not finding any muscle group that is causing.  Pt states she is always feeling like she is holding pee and leaks a little when coughing or sneezing.  Pt has a flare up 1-2/weekly and it comes and goes.    Limitations Walking;Standing    Patient Stated Goals not have the pain    Currently in Pain? Yes    Pain Score 7     Pain Location Hip    Pain Orientation Right;Left   usually left more than Rt   Pain Descriptors / Indicators Sharp;Tightness;Sore    Pain Type Acute pain    Pain Radiating Towards greater trochateric region, low back, sits bone    Pain Frequency Intermittent    Aggravating Factors  not sure, sometimes when lying down for a while; when she was walking felt hip pain in the back  Pain Relieving Factors resting in sitting at least 30 minutes but then not always totally better, 2-3 hours of lying down    Multiple Pain Sites No                [facility] PT Assessment - 12/05/20 0001       Assessment   Medical Diagnosis O26.892,R10.2 (ICD-10-CM) - Pelvic pain affecting pregnancy in second trimester, antepartum    Referring Provider (PT) Leftwich-Kirby, Kathie Dike, CNM      Precautions   Precautions None      Balance Screen   Has the patient fallen in the past 6 months No      [place] residence    Living Arrangements Spouse/significant other      Prior Function   Level of Independence Independent    Vocation Full time employment    Vocation Requirements hair stylist      Cognition   Overall Cognitive Status Within  Functional Limits for tasks assessed      ROM / Strength   AROM / PROM / Strength AROM;PROM;Strength      AROM   Overall AROM Comments lumbar 80% flexion      PROM   Overall PROM Comments Rt hip flex 75%+pain (labral tear)      Strength   Overall Strength Comments bil hip 5/5 except hip ext 4/4      Flexibility   Soft Tissue Assessment /Muscle Length yes    Hamstrings Rt 70%; Lt 80%      Palpation   Palpation comment tight lumbar and hamstrings      Ambulation/Gait   Gait Pattern Within Functional Limits                        Objective measurements completed on examination: See above findings.     Pelvic Floor Special Questions - 12/05/20 0001     Prior Pregnancies Yes    Number of Pregnancies --   1 miscarriage   Currently Sexually Active Yes    Urinary Leakage Yes    Activities that cause leaking Sneezing    Urinary urgency No    Urinary frequency normal    Fecal incontinence --   constipation since 35 y/o   Fluid intake moderate amount of water - 60 oz/day    Exam Type Vaginal    Palpation Lt >Rt tight levators    Strength weak squeeze, no lift    Strength # of reps 6   not fully relaxed in between   Strength # of seconds 2   slow release but staying tight   Tone high                         PT Short Term Goals - 12/05/20 1727       PT SHORT TERM GOAL #1   Title independent with initial HEP    Time 4    Period Weeks    Status New    Target Date 01/02/21      PT SHORT TERM GOAL #2   Title ...      PT SHORT TERM GOAL #3   Title ,,,               PT Long Term Goals - 12/05/20 1727       PT LONG TERM GOAL #1   Title independent with advanced HEP    Time  12    Period Weeks    Status New    Target Date 02/27/21      PT LONG TERM GOAL #2   Title able to elongate the pelvic floor muscles to promote relaxation for labor and delivery    Time 12    Period Weeks    Status New    Target Date 02/27/21      PT  LONG TERM GOAL #3   Title gluteal strength 5/5 for improved squat without pain    Time 12    Period Weeks    Status New    Target Date 02/27/21      PT LONG TERM GOAL #4   Title Pt will report at least 50% less pain when standing at work    Time 12    Period Weeks    Status New    Target Date 02/27/21      PT LONG TERM GOAL #5   Title .Marland KitchenMarland Kitchen                    Plan - 12/05/20 1720     Clinical Impression Statement Pt presents to skilled PT due to pelvic pain during pregnancy.  Pt has history of miscarriage so is also cautious about exercising. She has history of high tone pelvic floor.  Pt has chronic constipation.  Pt at this time has tight hamstrings and lumbar paraspinals.  Pt has tight levators Lt>Rt  and posterior more than anterior muscle bellies.  Pt has weakness due to high tone pelvic floor.  Pt did well with breathing and bulging the pelvic floor.  Pt has some weakness in gluteal muscles and overusing the hip flexors.  Pt will benefit from skilled PT to address impairments and to have an exercise routine she can do throughout pregnancy in order to have healthy pregnancy and successful labor and delivery.    Personal Factors and Comorbidities Comorbidity 2    Comorbidities high tone pelvic floor; history of miscarriage    Examination-Activity Limitations Continence;Squat;Bend    Examination-Participation Restrictions Community Activity;Occupation    Stability/Clinical Decision Making Evolving/Moderate complexity    Clinical Decision Making Moderate    Rehab Potential Excellent    PT Frequency 1x / week    PT Duration 12 weeks    PT Treatment/Interventions ADLs/Self Care Home Management;Biofeedback;Cryotherapy;Electrical Stimulation;Moist Heat;Therapeutic activities;Patient/family education;Manual techniques;Therapeutic exercise;Neuromuscular re-education;Passive range of motion;Taping    PT Next Visit Plan lumbar, adductor, levator STM; gluteal strengthening    PT  Home Exercise Plan foam rolling or small ball to pelvic floor    Consulted and Agree with Plan of Care Patient             Patient will benefit from skilled therapeutic intervention in order to improve the following deficits and impairments:  Difficulty walking, Increased fascial restricitons, Decreased strength, Decreased endurance, Pain, Impaired flexibility  Visit Diagnosis: Cramp and spasm  Muscle weakness (generalized)     Problem List Patient Active Problem List   Diagnosis Date Noted   Encounter for supervision of normal pregnancy in first trimester 09/26/2020   Missed abortion    Female infertility 08/17/2019   Symptomatic cholelithiasis 09/25/2011   Biliary dyskinesia 09/25/2011   Abdominal pain 08/24/2011    Jule Ser, PT 12/05/2020, 5:33 PM  [facility] @ [place], [place], [address] Phone: [phone]   Fax:  [phone]  Name: Krista Lee MRN: [id number] Date of Birth:  [birth year]

## 2020-12-08 LAB — AFP, SERUM, OPEN SPINA BIFIDA
AFP MoM: 0.97
AFP Value: 55.5 ng/mL
Gest. Age on Collection Date: 19.3 weeks
Maternal Age At EDD: 33.3 yr
OSBR Risk 1 IN: 10000
Test Results:: NEGATIVE
Weight: 137 [lb_av]

## 2021-01-01 NOTE — L&D Delivery Note (Signed)
Delivery Note ?Krista Lee is a 36 y.o. G2P0010 at [redacted]w[redacted]d admitted for oligohydramnios.  ? ?GBS Status: Negative/-- (04/03 1446) ?Maximum Maternal Temperature: 98.2 F ? ?Labor course: Initial SVE: 1.5/50. Augmentation with:  Cytotec, IP Foley, Pitocin . She then progressed to complete.  ?ROM: 5h 45m with clear fluid ? ?Birth: At 0241 a viable female was delivered via spontaneous vaginal delivery (Presentation: LOA ). Nuchal cord present: No.  Shoulders and body delivered in usual fashion. Infant placed directly on mom's abdomen for bonding/skin-to-skin, baby dried and stimulated. Cord clamped x 2 after 1 minute and cut by FOB.  Cord blood collected.  The placenta separated spontaneously and delivered schultz via gentle cord traction .  Pitocin infused rapidly IV per protocol.  Fundus firm with massage.  ?Placenta inspected and appears to be intact with a 3 VC.  Placenta/Cord with the following complications: marginal cord insertion .  Cord pH: N/A ?Sponge and instrument count were correct x2. ? ?Intrapartum complications:  Oligo ?Anesthesia:  epidural ?Episiotomy: none ?Lacerations:  Bilateral periurethral hemostatic not repaired ?Suture Repair:  N/A ?EBL (mL): 150 ? ? ?Infant: ?APGAR (1 MIN): 9   ?APGAR (5 MINS): 9   ?APGAR (10 MINS):    ?Infant weight: pending ? ?Mom to postpartum.  Baby to Couplet care / Skin to Skin. Placenta to L&D   ?Plans to Breastfeed ?Contraception:  Phexxi ?Circumcision: wants inpatient ? ?Note sent to [facility]: [facility] for pp visit. ? ?Adah Salvage, SNM ?04/19/2021 ?3:11 AM ? ?  ?

## 2021-01-09 ENCOUNTER — Ambulatory Visit: Payer: 59 | Attending: Obstetrics

## 2021-01-09 ENCOUNTER — Ambulatory Visit (INDEPENDENT_AMBULATORY_CARE_PROVIDER_SITE_OTHER): Payer: 59 | Admitting: Advanced Practice Midwife

## 2021-01-09 ENCOUNTER — Ambulatory Visit (INDEPENDENT_AMBULATORY_CARE_PROVIDER_SITE_OTHER): Payer: 59 | Admitting: Clinical

## 2021-01-09 ENCOUNTER — Other Ambulatory Visit: Payer: Self-pay

## 2021-01-09 ENCOUNTER — Ambulatory Visit: Payer: 59 | Admitting: *Deleted

## 2021-01-09 ENCOUNTER — Encounter: Payer: Self-pay | Admitting: *Deleted

## 2021-01-09 VITALS — BP 94/59 | HR 66 | Wt 142.0 lb

## 2021-01-09 VITALS — BP 115/65 | HR 70

## 2021-01-09 DIAGNOSIS — F411 Generalized anxiety disorder: Secondary | ICD-10-CM

## 2021-01-09 DIAGNOSIS — Q796 Ehlers-Danlos syndrome, unspecified: Secondary | ICD-10-CM

## 2021-01-09 DIAGNOSIS — Z3A24 24 weeks gestation of pregnancy: Secondary | ICD-10-CM

## 2021-01-09 DIAGNOSIS — O26892 Other specified pregnancy related conditions, second trimester: Secondary | ICD-10-CM

## 2021-01-09 DIAGNOSIS — O4442 Low lying placenta NOS or without hemorrhage, second trimester: Secondary | ICD-10-CM

## 2021-01-09 DIAGNOSIS — Z3689 Encounter for other specified antenatal screening: Secondary | ICD-10-CM

## 2021-01-09 DIAGNOSIS — O4402 Placenta previa specified as without hemorrhage, second trimester: Secondary | ICD-10-CM | POA: Diagnosis not present

## 2021-01-09 DIAGNOSIS — Z3481 Encounter for supervision of other normal pregnancy, first trimester: Secondary | ICD-10-CM

## 2021-01-09 DIAGNOSIS — R102 Pelvic and perineal pain: Secondary | ICD-10-CM

## 2021-01-09 NOTE — Progress Notes (Signed)
Diagnosis: F41.9 Time: 10:03am-10:53am CPT Code: 20100F-12, SEEN  Adrianne was seen remotely using secure video conferencing. She was in her home in [place] and the therapist was in her home at the time of the appointment. She shared that she had had a stressful few months during which her husband had been diagnosed with A-fib, and a beloved pet had died. Session focused on better understanding patterns from her childhood that may contribute to current issues in her relationship. She is scheduled to be seen again in 6 weeks.  Treatment Plan Client Abilities/Strengths  Ayerim presented as insightful and motivated, with clear goals for therapy.  Client Treatment Preferences  Janat would like to begin with monthly appointments, and increase frequency once she has hit her insurance deductible.  Client Statement of Needs  Pailynn is seeking CBT to address anxiety and relationship challenges.  Treatment Level  Monthly  Symptoms  Anxiety : vivid, persistent thoughts of worst case scenarios that are difficult to disengage from (Status: maintained). Relational Challenges: Difficulty with mutual triggering of uncomfortable emotions in marriage (Status: maintained).  Problems Addressed  New Description, New Description  Goals 1. Difficulty in relationship with husband Objective Development of strategies to regulate emotional response to husband's stress Target Date: 2021-03-02 Frequency: Biweekly  Progress: 0 Modality: individual  Related Interventions Therapist will work with [name] to develop communication strategies by helping her to consider how she might respond to different situations and offering opportunities to practice, such as through written exercises and role plays Therapist will work with [name] to develop her communication strategies, including labeling and expressing her emotions and using "I" statements 2. Persistent anxiety triggered by thoughts of worse case scenarios that feel  real Objective Decrease in frequency and severity of anxiety, increase in overall well bein Target Date: 2021-03-02 Frequency: Biweekly  Progress: 0 Modality: individual  Related Interventions Therapist will provide referrals for additional resources as appropriate Therapist will provide Keaira with strategies to regulate her emotions, including meditation, breathing exercises, mindfulness, and self-care. Therapist will help Roy to identify and disengage from maladaptive thought patterns using CBT-based strategies Therapist will provide Elham with opportunities to process her experiences in session Diagnosis Axis none 300.00 (Anxiety state, unspecified) - Open - [Signifier: n/a]    Conditions For Discharge Achievement of treatment goals and objectives Therapist Signature ___________________________ Patient Signature ___________________________    [name], PhD

## 2021-01-09 NOTE — Progress Notes (Signed)
° °  PRENATAL VISIT NOTE  Subjective:  Krista Lee is a 36 y.o. G2P0010 at [redacted]w[redacted]d being seen today for ongoing prenatal care.  She is currently monitored for the following issues for this low-risk pregnancy and has Abdominal pain; Symptomatic cholelithiasis; Biliary dyskinesia; Female infertility; Missed abortion; and Encounter for supervision of normal pregnancy in first trimester on their problem list.  Patient reports no complaints.  Contractions: Not present. Vag. Bleeding: None.  Movement: Present. Denies leaking of fluid.   The following portions of the patient's history were reviewed and updated as appropriate: allergies, current medications, past family history, past medical history, past social history, past surgical history and problem list.   Objective:   Vitals:   01/09/21 1335  BP: (!) 94/59  Pulse: 66  Weight: 142 lb (64.4 kg)    Fetal Status: Fetal Heart Rate (bpm): 150   Movement: Present     General:  Alert, oriented and cooperative. Patient is in no acute distress.  Skin: Skin is warm and dry. No rash noted.   Cardiovascular: Normal heart rate noted  Respiratory: Normal respiratory effort, no problems with respiration noted  Abdomen: Soft, gravid, appropriate for gestational age.  Pain/Pressure: Absent     Pelvic: Cervical exam deferred        Extremities: Normal range of motion.     Mental Status: Normal mood and affect. Normal behavior. Normal judgment and thought content.   Assessment and Plan:  Pregnancy: G2P0010 at [redacted]w[redacted]d 1. Encounter for supervision of other normal pregnancy in first trimester --Anticipatory guidance about next visits/weeks of pregnancy given. --Questions answered about exercise and safe foods in pregnancy. --Next appt in 4 weeks for GTT  2. Pelvic pain affecting pregnancy in second trimester, antepartum --PT helping, pt increasing yoga, exercise now and feeling well  3. Ehlers-Danlos syndrome -- Per MFM.  Growth [place] Q 4 weeks, anesthesia  consult, fetal echo   Preterm labor symptoms and general obstetric precautions including but not limited to vaginal bleeding, contractions, leaking of fluid and fetal movement were reviewed in detail with the patient. Please refer to After Visit Summary for other counseling recommendations.   No follow-ups on file.  Future Appointments  Date Time Provider Department Center  01/09/2021  3:15 PM [facility] NURSE [facility]  01/09/2021  3:30 PM [facility] US3 [facility]  01/16/2021 11:00 AM Desenglau, Shireen Quan, PT [facility] None  01/23/2021 11:00 AM Desenglau, Shireen Quan, PT [facility] None  01/30/2021 11:00 AM Desenglau, Shireen Quan, PT [facility] None  02/06/2021 10:15 AM Leftwich-Kirby, Wilmer Floor, CNM [facility] None  02/13/2021 11:45 AM Desenglau, Shireen Quan, PT [facility] None  02/20/2021 11:45 AM Desenglau, Shireen Quan, PT [facility] None  02/27/2021 11:45 AM Desenglau, Shireen Quan, PT [facility] None    Sharen Counter, CNM

## 2021-01-10 ENCOUNTER — Other Ambulatory Visit: Payer: Self-pay | Admitting: *Deleted

## 2021-01-10 DIAGNOSIS — O4443 Low lying placenta NOS or without hemorrhage, third trimester: Secondary | ICD-10-CM

## 2021-01-15 IMAGING — US US OB LIMITED
1 series · 8 of 8 positions shown · non-contrast
Comparison: none

[Series 1: us ob limited · 8 of 8 slices shown]
[im 1/8]
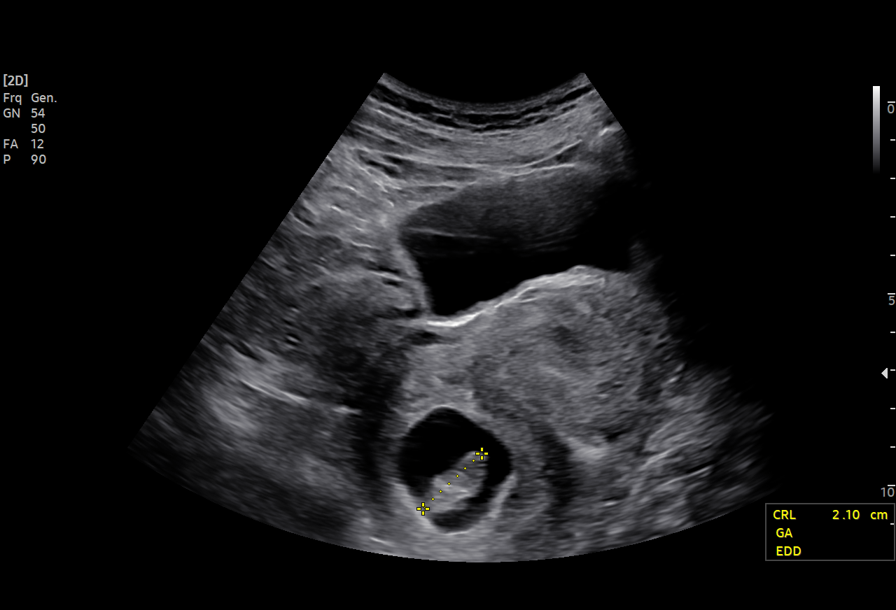
[im 2/8]
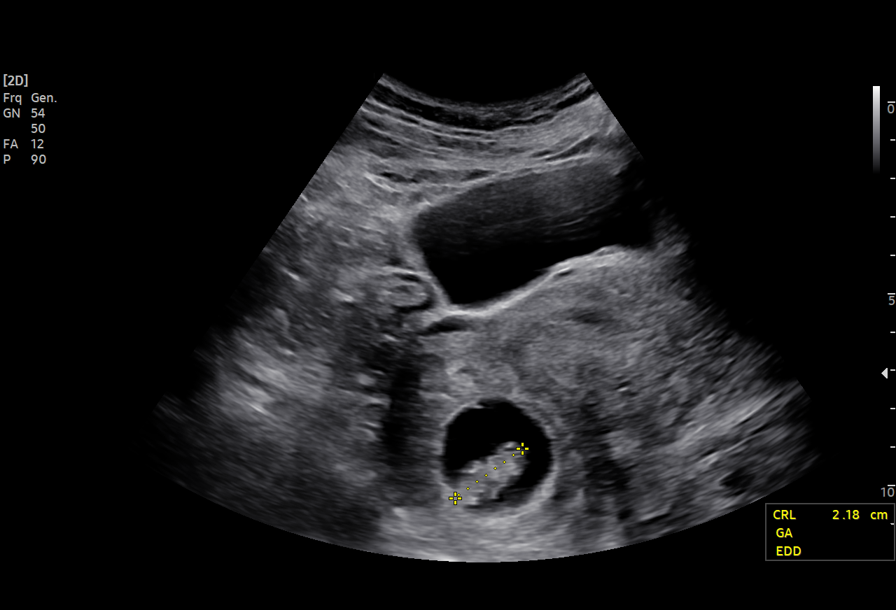
[im 3/8]
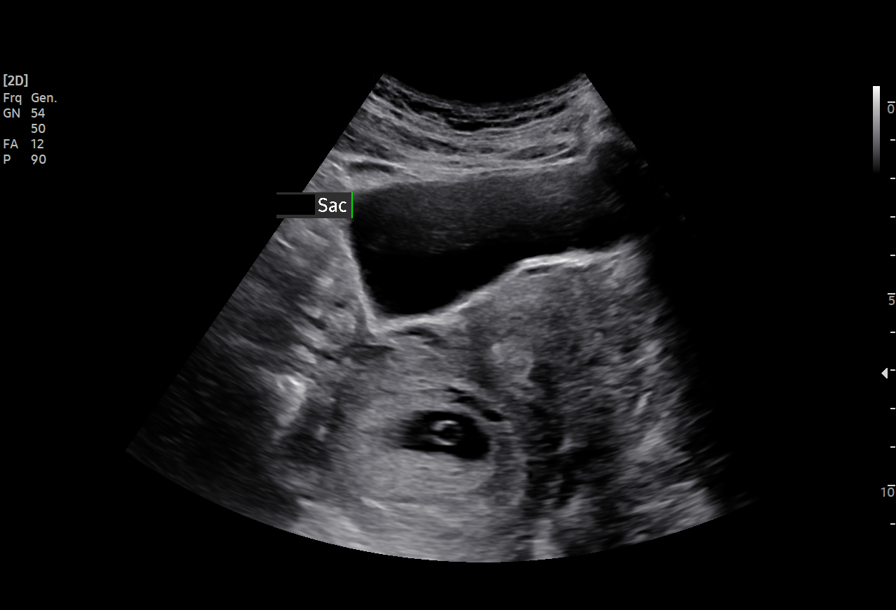
[im 4/8]
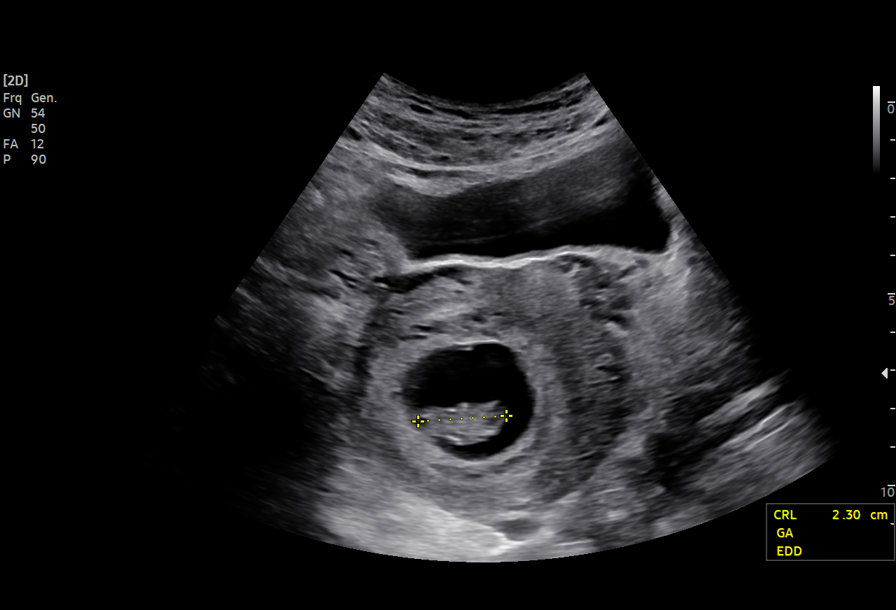
[im 5/8]
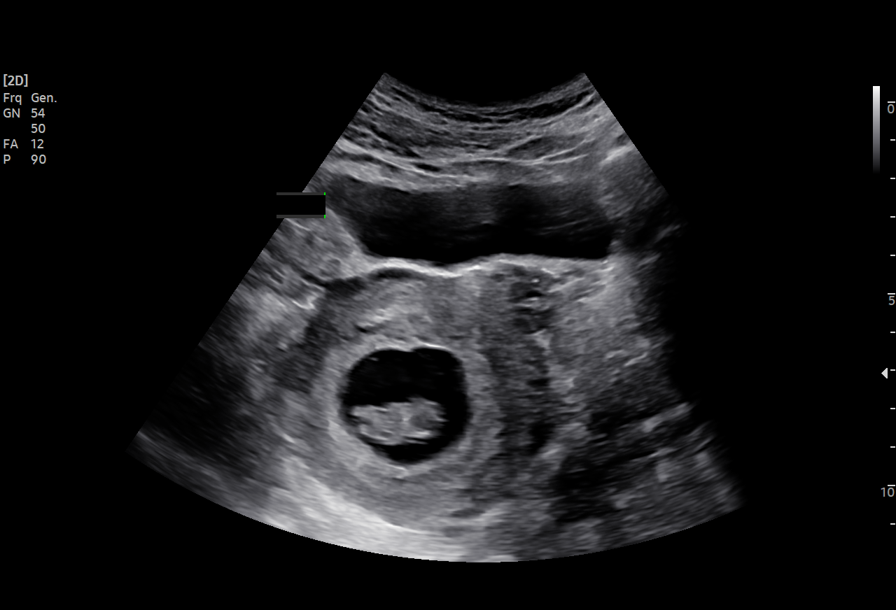
[im 6/8]
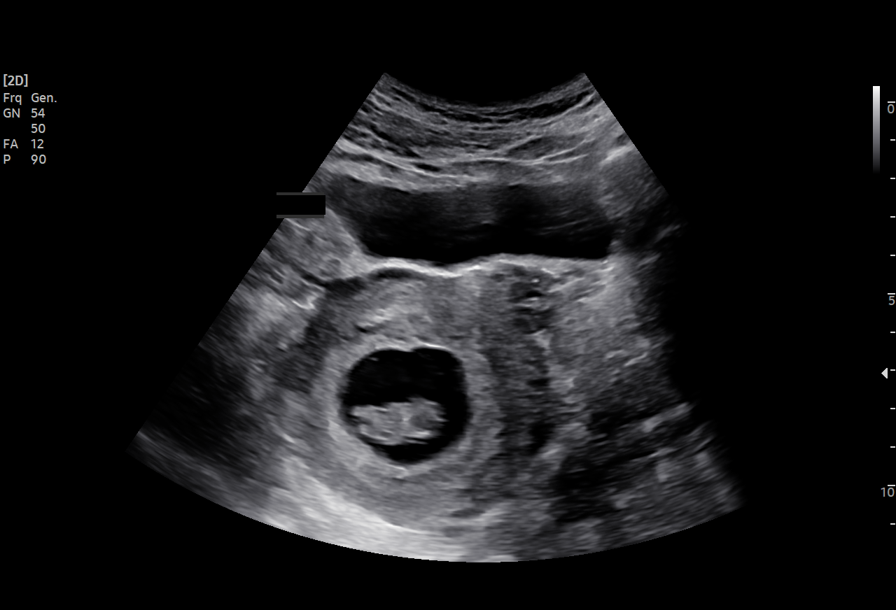
[im 7/8]
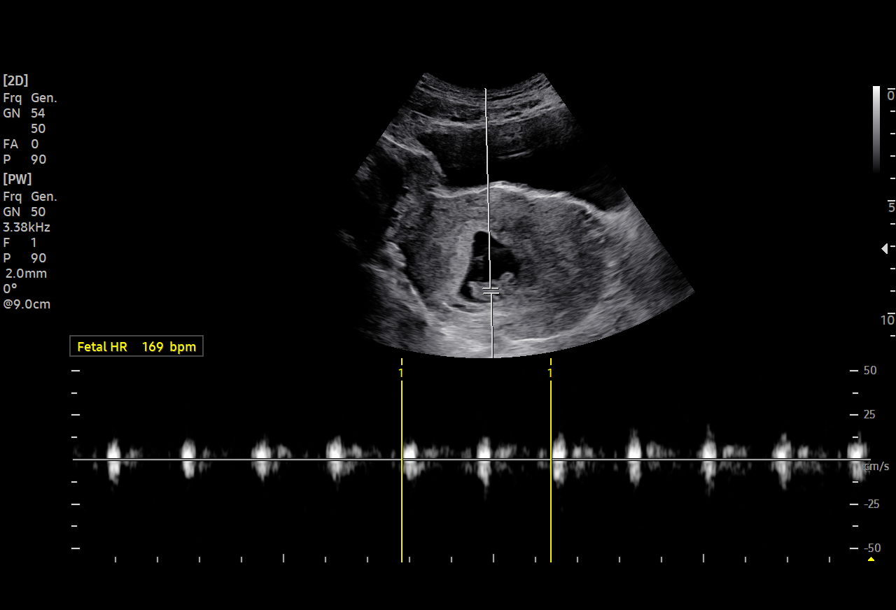
[im 8/8]
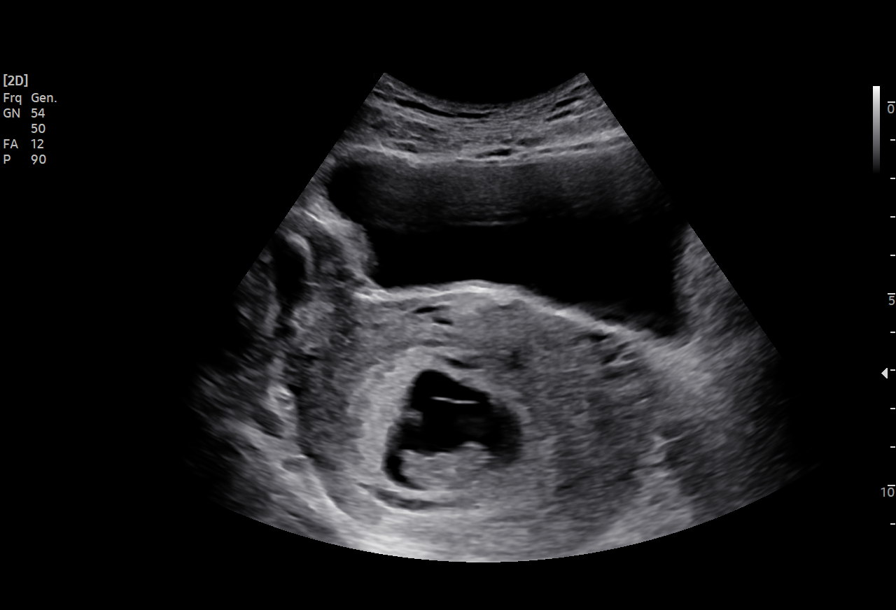

[8 of 8 positions shown; findings below may reference images not displayed]

[REDACTED]care
                                                              Healthcare

 1   [HOSPITAL]                        [address].0      SOR[facility] M[facility]RIFF

Indications

 8 weeks gestation of pregnancy
 Pregnancy with inconclusive fetal viability
Fetal Evaluation

 Num Of Fetuses:          1
 Fetal Heart Rate(bpm):   169
 Cardiac Activity:        Observed
Biometry

 CRL:      21.9   mm     G. Age:  8w 5d                    EDD:   06/02/20
Gestational Age

 Best:           8w 5d     Det. By:  U/S C R L (10/27/19)       EDD:  06/02/20
Comments

 Single live IUP at 8w5d by CRL. LMP 08/23/19.
Impression

 Viable intrauterine pregnancy. Routine prenatal care
                 Akimi, Swee Lee

## 2021-01-16 ENCOUNTER — Ambulatory Visit: Payer: 59 | Attending: Advanced Practice Midwife | Admitting: Physical Therapy

## 2021-01-16 ENCOUNTER — Other Ambulatory Visit: Payer: Self-pay

## 2021-01-16 DIAGNOSIS — M25651 Stiffness of right hip, not elsewhere classified: Secondary | ICD-10-CM | POA: Diagnosis present

## 2021-01-16 DIAGNOSIS — R252 Cramp and spasm: Secondary | ICD-10-CM | POA: Insufficient documentation

## 2021-01-16 DIAGNOSIS — M6281 Muscle weakness (generalized): Secondary | ICD-10-CM | POA: Insufficient documentation

## 2021-01-16 NOTE — Patient Instructions (Signed)
Access Code: [email or web address] URL: [email or web address] Date: 01/16/2021 Prepared by: Dwana Curd  Exercises Shoulder extension with resistance - Neutral - 1 x daily - 7 x weekly - 3 sets - 10 reps Standing Anti-Rotation Press with Anchored Resistance - 1 x daily - 7 x weekly - 3 sets - 10 reps Quadruped Alternating Leg Extensions - 1 x daily - 7 x weekly - 3 sets - 10 reps Quadruped Alternating Arm Lift - 1 x daily - 7 x weekly - 2 sets - 10 reps

## 2021-01-16 NOTE — Therapy (Signed)
[facility] @ [place], [place], [address] Phone: [phone]   Fax:  [phone]  Physical Therapy Treatment  Patient Details  Name: Krista Lee MRN: [id number] Date of Birth: 1985/05/10 Referring Provider (PT): Elvera Maria, CNM   Encounter Date: 01/16/2021   PT End of Session - 01/16/21 1116     Visit Number 2    Date for PT Re-Evaluation 02/27/21    Authorization Type Friday    PT Start Time 1103    PT Stop Time 1143    PT Time Calculation (min) 40 min    Activity Tolerance Patient tolerated treatment well    Behavior During Therapy [facility] for tasks assessed/performed             Past Medical History:  Diagnosis Date   ADD (attention deficit disorder)    Allergy    no meds   Anemia    as adolescent   Anxiety    Depression    GERD (gastroesophageal reflux disease)    doesn't take any meds for this, diet controlled   Headache(784.0)    Heart murmur    as a child - no problems   History of bronchitis    as a teenager, no problems as adult   History of migraine    can't remember when the last one was   Insomnia    no meds   Panic attacks     Past Surgical History:  Procedure Laterality Date   CHOLECYSTECTOMY  10/22/2011   Procedure: LAPAROSCOPIC CHOLECYSTECTOMY;  Surgeon: Harl Bowie, MD;  Location: [place];  Service: General;  Laterality: N/A;   DILATION AND EVACUATION N/A 12/07/2019   Procedure: DILATATION AND EVACUATION;  Surgeon: Mora Bellman, MD;  Location: [place];  Service: Gynecology;  Laterality: N/A;  NEEDS ULTRASOUND GUIDANCE ANORA KIT SENT    HYSTEROSCOPY WITH RESECTOSCOPE N/A 04/28/2013   Procedure:  HYSTEROSCOPY ;  Surgeon: Princess Bruins, MD;  Location: [facility];  Service: Gynecology;  Laterality: N/A;   LAPAROSCOPY N/A 04/28/2013   Procedure: LAPAROSCOPY DIAGNOSTIC;  Surgeon: Princess Bruins, MD;  Location: [facility];  Service: Gynecology;   Laterality: N/A;  2 hrs. total   OPERATIVE ULTRASOUND N/A 12/07/2019   Procedure: OPERATIVE ULTRASOUND;  Surgeon: Mora Bellman, MD;  Location: [place];  Service: Gynecology;  Laterality: N/A;   WISDOM TOOTH EXTRACTION  61yr ago    There were no vitals filed for this visit.   Subjective Assessment - 01/16/21 1414     Subjective Pt states she had a flare up a couple of weeks ago and that was the worst.  Today she is okay.  Pt says it switches sides.    Limitations Walking;Standing    Patient Stated Goals not have the pain    Currently in Pain? No/denies                               [facility]       Exercises   Exercises Lumbar      Lumbar Exercises: Standing   Other Standing Lumbar Exercises shoulder ext and isometric flex    Other Standing Lumbar Exercises pallof press yellow band - 20x      Lumbar Exercises: Sidelying   Hip Abduction Both;15 reps      Lumbar Exercises: Quadruped   Opposite Arm/Leg Raise Right  arm/Left leg;Left arm/Right leg;5 reps      Manual Therapy   Manual Therapy Soft tissue mobilization    Soft tissue mobilization gluteals, adductors, lumbar paraspinals                     PT Education - 01/16/21 1157     Education Details Access Code: [email or web address]    Person(s) Educated Patient    Methods Explanation;Demonstration;Tactile cues;Verbal cues;Handout    Comprehension Verbalized understanding;Returned demonstration              PT Short Term Goals - 01/16/21 1400       PT SHORT TERM GOAL #1   Title independent with initial HEP    Status Achieved               PT Long Term Goals - 12/05/20 1727       PT LONG TERM GOAL #1   Title independent with advanced HEP    Time 12    Period Weeks    Status New    Target Date 02/27/21      PT LONG TERM GOAL #2   Title able to elongate the pelvic floor muscles to promote relaxation for labor and  delivery    Time 12    Period Weeks    Status New    Target Date 02/27/21      PT LONG TERM GOAL #3   Title gluteal strength 5/5 for improved squat without pain    Time 12    Period Weeks    Status New    Target Date 02/27/21      PT LONG TERM GOAL #4   Title Pt will report at least 50% less pain when standing at work    Time 12    Period Weeks    Status New    Target Date 02/27/21      PT LONG TERM GOAL #5   Title .Marland KitchenMarland Kitchen                   Plan - 01/16/21 1400     Clinical Impression Statement Pt is independent with initial HEP and feeling okay today.  She has had some flare ups since the last time she was here for the evaluation.  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  Pt was able to do supine and progress to standing core strength today.  Pt will benefit from skiled PT to continue to work on strength progression for improved funciton during pregnancy.    PT Treatment/Interventions ADLs/Self Care Home Management;Biofeedback;Cryotherapy;Electrical Stimulation;Moist Heat;Therapeutic activities;Patient/family education;Manual techniques;Therapeutic exercise;Neuromuscular re-education;Passive range of motion;Taping    PT Next Visit Plan progress core strength, hip hinge education, elevated planks    Consulted and Agree with Plan of Care Patient             Patient will benefit from skilled therapeutic intervention in order to improve the following deficits and impairments:  Difficulty walking, Increased fascial restricitons, Decreased strength, Decreased endurance, Pain, Impaired flexibility  Visit Diagnosis: Cramp and spasm  Muscle weakness (generalized)  Stiffness of right hip, not elsewhere classified     Problem List Patient Active Problem List   Diagnosis Date Noted   Encounter for supervision of normal pregnancy in first trimester 09/26/2020   Missed abortion    Female  infertility 08/17/2019   Symptomatic cholelithiasis 09/25/2011   Biliary dyskinesia 09/25/2011  Abdominal pain 08/24/2011    Jule Ser, PT 01/16/2021, 3:05 PM  [facility] @ [place], [place], [address] Phone: [phone]   Fax:  [phone]  Name: Krista Lee MRN: [id number] Date of Birth: 1985/10/26

## 2021-01-18 NOTE — Addendum Note (Signed)
Addended by: Barton Dubois on: 01/18/2021 10:51 AM   Modules accepted: Orders

## 2021-01-21 ENCOUNTER — Encounter: Payer: Self-pay | Admitting: Advanced Practice Midwife

## 2021-01-23 ENCOUNTER — Other Ambulatory Visit: Payer: Self-pay | Admitting: Advanced Practice Midwife

## 2021-01-23 ENCOUNTER — Encounter: Payer: Self-pay | Admitting: Physical Therapy

## 2021-01-23 DIAGNOSIS — Z3481 Encounter for supervision of other normal pregnancy, first trimester: Secondary | ICD-10-CM

## 2021-01-23 DIAGNOSIS — R42 Dizziness and giddiness: Secondary | ICD-10-CM

## 2021-01-25 ENCOUNTER — Other Ambulatory Visit: Payer: Self-pay

## 2021-01-25 ENCOUNTER — Other Ambulatory Visit: Payer: 59

## 2021-01-25 DIAGNOSIS — Z3481 Encounter for supervision of other normal pregnancy, first trimester: Secondary | ICD-10-CM

## 2021-01-26 LAB — COMPREHENSIVE METABOLIC PANEL
ALT: 14 IU/L (ref 0–32)
AST: 15 IU/L (ref 0–40)
Albumin/Globulin Ratio: 1.4 (ref 1.2–2.2)
Albumin: 3.4 g/dL — ABNORMAL LOW (ref 3.8–4.8)
Alkaline Phosphatase: 53 IU/L (ref 44–121)
BUN/Creatinine Ratio: 10 (ref 9–23)
BUN: 7 mg/dL (ref 6–20)
Bilirubin Total: <0.2 mg/dL (ref 0.0–1.2)
CO2: 21 mmol/L (ref 20–29)
Calcium: 8.8 mg/dL (ref 8.7–10.2)
Chloride: 103 mmol/L (ref 96–106)
Creatinine, Ser: 0.67 mg/dL (ref 0.57–1.00)
Globulin, Total: 2.4 g/dL (ref 1.5–4.5)
Glucose: 104 mg/dL — ABNORMAL HIGH (ref 70–99)
Potassium: 3.9 mmol/L (ref 3.5–5.2)
Sodium: 138 mmol/L (ref 134–144)
Total Protein: 5.8 g/dL — ABNORMAL LOW (ref 6.0–8.5)
eGFR: 118 mL/min/{1.73_m2} (ref >59)

## 2021-01-26 LAB — CBC
Hematocrit: 34.7 % (ref 34.0–46.6)
Hemoglobin: 12 g/dL (ref 11.1–15.9)
MCH: 30.5 pg (ref 26.6–33.0)
MCHC: 34.6 g/dL (ref 31.5–35.7)
MCV: 88 fL (ref 79–97)
Platelets: 308 10*3/uL (ref 150–450)
RBC: 3.94 x10E6/uL (ref 3.77–5.28)
RDW: 12.6 % (ref 11.7–15.4)
WBC: 9.1 10*3/uL (ref 3.4–10.8)

## 2021-01-26 LAB — TSH: TSH: 2.07 u[IU]/mL (ref 0.450–4.500)

## 2021-01-30 ENCOUNTER — Other Ambulatory Visit: Payer: Self-pay

## 2021-01-30 ENCOUNTER — Encounter: Payer: Self-pay | Admitting: Physical Therapy

## 2021-01-30 ENCOUNTER — Ambulatory Visit: Payer: 59 | Admitting: Physical Therapy

## 2021-01-30 DIAGNOSIS — M6281 Muscle weakness (generalized): Secondary | ICD-10-CM

## 2021-01-30 DIAGNOSIS — R252 Cramp and spasm: Secondary | ICD-10-CM | POA: Diagnosis not present

## 2021-01-30 DIAGNOSIS — M25651 Stiffness of right hip, not elsewhere classified: Secondary | ICD-10-CM

## 2021-01-30 NOTE — Patient Instructions (Signed)
Access Code: [email or web address] URL: [email or web address] Date: 01/30/2021 Prepared by: Jari Favre  Exercises Shoulder extension with resistance - Neutral - 1 x daily - 7 x weekly - 3 sets - 10 reps Standing Anti-Rotation Press with Anchored Resistance - 1 x daily - 7 x weekly - 3 sets - 10 reps Quadruped Alternating Leg Extensions - 1 x daily - 7 x weekly - 3 sets - 10 reps Quadruped Alternating Arm Lift - 1 x daily - 7 x weekly - 2 sets - 10 reps Modified Single-Leg Deadlift - 1 x daily - 7 x weekly - 3 sets - 10 reps Forward T - 1 x daily - 7 x weekly - 3 sets - 10 reps Half Deadlift with Kettlebell - 1 x daily - 7 x weekly - 3 sets - 10 reps

## 2021-01-30 NOTE — Therapy (Signed)
[facility] @ [facility] [place], [place], [address] Phone: [phone]   Fax:  [phone]  Physical Therapy Treatment  Patient Details  Name: Krista Lee MRN: [id number] Date of Birth: 1985-05-25 Referring Provider (PT): Elvera Maria, CNM   Encounter Date: 01/30/2021   PT End of Session - 01/30/21 1118     Visit Number 3    Date for PT Re-Evaluation 02/27/21    Authorization Type Friday    PT Start Time 1106    PT Stop Time 1146    PT Time Calculation (min) 40 min    Activity Tolerance Patient tolerated treatment well    Behavior During Therapy [facility] for tasks assessed/performed             Past Medical History:  Diagnosis Date   ADD (attention deficit disorder)    Allergy    no meds   Anemia    as adolescent   Anxiety    Depression    GERD (gastroesophageal reflux disease)    doesn't take any meds for this, diet controlled   Headache(784.0)    Heart murmur    as a child - no problems   History of bronchitis    as a teenager, no problems as adult   History of migraine    can't remember when the last one was   Insomnia    no meds   Panic attacks     Past Surgical History:  Procedure Laterality Date   CHOLECYSTECTOMY  10/22/2011   Procedure: LAPAROSCOPIC CHOLECYSTECTOMY;  Surgeon: Harl Bowie, MD;  Location: [place];  Service: General;  Laterality: N/A;   DILATION AND EVACUATION N/A 12/07/2019   Procedure: DILATATION AND EVACUATION;  Surgeon: Mora Bellman, MD;  Location: [place];  Service: Gynecology;  Laterality: N/A;  NEEDS ULTRASOUND GUIDANCE ANORA KIT SENT    HYSTEROSCOPY WITH RESECTOSCOPE N/A 04/28/2013   Procedure:  HYSTEROSCOPY ;  Surgeon: Princess Bruins, MD;  Location: [facility];  Service: Gynecology;  Laterality: N/A;   LAPAROSCOPY N/A 04/28/2013   Procedure: LAPAROSCOPY DIAGNOSTIC;  Surgeon: Princess Bruins, MD;  Location: [facility];  Service: Gynecology;   Laterality: N/A;  2 hrs. total   OPERATIVE ULTRASOUND N/A 12/07/2019   Procedure: OPERATIVE ULTRASOUND;  Surgeon: Mora Bellman, MD;  Location: [place];  Service: Gynecology;  Laterality: N/A;   WISDOM TOOTH EXTRACTION  28yr ago    There were no vitals filed for this visit.   Subjective Assessment - 01/30/21 1109     Subjective Pt having more back pain. Exercises are getting easier.  Had a flare up on Tuesday last week with pain in the front of the thigh    Patient Stated Goals not have the pain    Currently in Pain? No/denies                               [facility] - 01/30/21 0001       Lumbar Exercises: Stretches   Figure 4 Stretch 2 reps;20 seconds;With overpressure    Other Lumbar Stretch Exercise hip rotation stretch      Lumbar Exercises: Standing   Other Standing Lumbar Exercises hip hinge bil, staggered, and single leg      Lumbar Exercises: Sidelying   Hip Abduction 20 reps;Left      Lumbar Exercises: Quadruped   Other Quadruped Lumbar Exercises plank  on knees      Manual Therapy   Manual Therapy Muscle Energy Technique    Muscle Energy Technique Lt leg ext resisted 3 x 20 sec; abd/adduction 10x                     PT Education - 01/30/21 1204     Education Details Access Code: [email or web address]    Person(s) Educated Patient    Methods Explanation;Demonstration;Tactile cues;Verbal cues;Handout    Comprehension Returned demonstration;Verbalized understanding              PT Short Term Goals - 01/30/21 1131       PT SHORT TERM GOAL #1   Title independent with initial HEP    Status Achieved               PT Long Term Goals - 12/05/20 1727       PT LONG TERM GOAL #1   Title independent with advanced HEP    Time 12    Period Weeks    Status New    Target Date 02/27/21      PT LONG TERM GOAL #2   Title able to elongate the pelvic floor muscles to promote relaxation for labor  and delivery    Time 12    Period Weeks    Status New    Target Date 02/27/21      PT LONG TERM GOAL #3   Title gluteal strength 5/5 for improved squat without pain    Time 12    Period Weeks    Status New    Target Date 02/27/21      PT LONG TERM GOAL #4   Title Pt will report at least 50% less pain when standing at work    Time 12    Period Weeks    Status New    Target Date 02/27/21      PT LONG TERM GOAL #5   Title .Marland KitchenMarland Kitchen                   Plan - 01/30/21 1125     Clinical Impression Statement Pt is doing well and making progress with the exercises.  Pt had a slight anterior rotation of the left ilium and improved with MET.  Pt has some weakness in glutes Lt>Rt.  She needed some cues with hip hingeing to keep pelvis neutral.  She also felt the exercise in her glutes on Left side.  Pt fatigues quickly.  Pt was educated on log rolling and exercises updated inHEP to match current level of strength.    PT Treatment/Interventions ADLs/Self Care Home Management;Biofeedback;Cryotherapy;Electrical Stimulation;Moist Heat;Therapeutic activities;Patient/family education;Manual techniques;Therapeutic exercise;Neuromuscular re-education;Passive range of motion;Taping    PT Next Visit Plan progress core strength,follow up on hip hinge, single leg, half kneel with weight    PT Home Exercise Plan Access Code: [email or web address]    Consulted and Agree with Plan of Care Patient             Patient will benefit from skilled therapeutic intervention in order to improve the following deficits and impairments:  Difficulty walking, Increased fascial restricitons, Decreased strength, Decreased endurance, Pain, Impaired flexibility  Visit Diagnosis: Cramp and spasm  Muscle weakness (generalized)  Stiffness of right hip, not elsewhere classified     Problem List Patient Active Problem List   Diagnosis Date Noted   Encounter for supervision of normal pregnancy in first trimester 09/26/2020    Missed abortion  Female infertility 08/17/2019   Symptomatic cholelithiasis 09/25/2011   Biliary dyskinesia 09/25/2011   Abdominal pain 08/24/2011    Jule Ser, PT 01/30/2021, 12:18 PM  [facility] @ [place], [place], [address] Phone: [phone]   Fax:  [phone]  Name: NEREIDA SCHEPP MRN: [id number] Date of Birth: 12-17-1985

## 2021-02-06 ENCOUNTER — Ambulatory Visit: Payer: 59 | Attending: Obstetrics

## 2021-02-06 ENCOUNTER — Ambulatory Visit (INDEPENDENT_AMBULATORY_CARE_PROVIDER_SITE_OTHER): Payer: 59 | Admitting: Advanced Practice Midwife

## 2021-02-06 ENCOUNTER — Other Ambulatory Visit: Payer: 59

## 2021-02-06 ENCOUNTER — Encounter: Payer: Self-pay | Admitting: *Deleted

## 2021-02-06 ENCOUNTER — Ambulatory Visit: Payer: 59 | Admitting: *Deleted

## 2021-02-06 ENCOUNTER — Other Ambulatory Visit: Payer: Self-pay

## 2021-02-06 ENCOUNTER — Encounter: Payer: Self-pay | Admitting: Advanced Practice Midwife

## 2021-02-06 VITALS — BP 103/66 | HR 68 | Wt 150.0 lb

## 2021-02-06 VITALS — BP 105/60 | HR 71

## 2021-02-06 DIAGNOSIS — Z23 Encounter for immunization: Secondary | ICD-10-CM

## 2021-02-06 DIAGNOSIS — O4440 Low lying placenta NOS or without hemorrhage, unspecified trimester: Secondary | ICD-10-CM | POA: Diagnosis present

## 2021-02-06 DIAGNOSIS — Z3A28 28 weeks gestation of pregnancy: Secondary | ICD-10-CM | POA: Diagnosis not present

## 2021-02-06 DIAGNOSIS — Z3481 Encounter for supervision of other normal pregnancy, first trimester: Secondary | ICD-10-CM

## 2021-02-06 DIAGNOSIS — Q796 Ehlers-Danlos syndrome, unspecified: Secondary | ICD-10-CM

## 2021-02-06 DIAGNOSIS — O4443 Low lying placenta NOS or without hemorrhage, third trimester: Secondary | ICD-10-CM

## 2021-02-06 MED ORDER — BREAST PUMP MISC: 1.0000 | Freq: Every day | 0 refills | Status: DC

## 2021-02-06 NOTE — Progress Notes (Signed)
° °  PRENATAL VISIT NOTE  Subjective:  Krista Lee is a 36 y.o. G2P0010 at [redacted]w[redacted]d being seen today for ongoing prenatal care.  She is currently monitored for the following issues for this low-risk pregnancy and has Abdominal pain; Symptomatic cholelithiasis; Biliary dyskinesia; Female infertility; Missed abortion; Encounter for supervision of normal pregnancy in first trimester; and Ehlers-Danlos syndrome on their problem list.  Patient reports  hip pain when working, improved with PT .  Contractions: Not present. Vag. Bleeding: None.  Movement: Present. Denies leaking of fluid.   The following portions of the patient's history were reviewed and updated as appropriate: allergies, current medications, past family history, past medical history, past social history, past surgical history and problem list.   Objective:   Vitals:   02/06/21 0914  BP: 103/66  Pulse: 68  Weight: 150 lb (68 kg)    Fetal Status: Fetal Heart Rate (bpm): 140 Fundal Height: 28 cm Movement: Present     General:  Alert, oriented and cooperative. Patient is in no acute distress.  Skin: Skin is warm and dry. No rash noted.   Cardiovascular: Normal heart rate noted  Respiratory: Normal respiratory effort, no problems with respiration noted  Abdomen: Soft, gravid, appropriate for gestational age.  Pain/Pressure: Absent     Pelvic: Cervical exam deferred        Extremities: Normal range of motion.  Edema: None  Mental Status: Normal mood and affect. Normal behavior. Normal judgment and thought content.   Assessment and Plan:  Pregnancy: G2P0010 at [redacted]w[redacted]d 1. Encounter for supervision of other normal pregnancy in first trimester --Anticipatory guidance about next visits/weeks of pregnancy given.  --Rx for breast pump printed for pt insurance --Questions answered --Next appt in 2 weeks  - Glucose Tolerance, 2 Hours w/1 Hour - CBC - HIV Antibody (routine testing w rflx) - RPR - Tdap vaccine greater than or equal to  7yo IM  2. Ehlers-Danlos syndrome --Growth [place] with MFM, fetal echo scheduled  3. [redacted] weeks gestation of pregnancy  Preterm labor symptoms and general obstetric precautions including but not limited to vaginal bleeding, contractions, leaking of fluid and fetal movement were reviewed in detail with the patient. Please refer to After Visit Summary for other counseling recommendations.   Return in about 2 weeks (around 02/20/2021).  Future Appointments  Date Time Provider Department Center  02/06/2021 10:15 AM Hurshel Party, CNM [facility] None  02/06/2021  3:30 PM [facility] NURSE [facility]  02/06/2021  3:45 PM [facility] US7 [facility]  02/13/2021  9:20 AM [facility] ECHO 2 [facility]  02/13/2021 11:45 AM Desenglau, Shireen Quan, PT [facility] None  02/20/2021 10:00 AM Mendelson, Hulda Humphrey, PhD [facility] None  02/20/2021 11:45 AM Desenglau, Shireen Quan, PT [facility] None  02/20/2021  2:30 PM Allayne Stack, DO [facility] None  02/27/2021 11:45 AM Desenglau, Shireen Quan, PT [facility] None  03/06/2021  9:30 AM Desenglau, Shireen Quan, PT [facility] None  03/13/2021 10:15 AM Desenglau, Shireen Quan, PT [facility] None  03/20/2021 10:00 AM Mendelson, Hulda Humphrey, PhD [facility] None    Sharen Counter, CNM

## 2021-02-07 ENCOUNTER — Other Ambulatory Visit: Payer: Self-pay | Admitting: *Deleted

## 2021-02-07 DIAGNOSIS — O4443 Low lying placenta NOS or without hemorrhage, third trimester: Secondary | ICD-10-CM

## 2021-02-07 DIAGNOSIS — O09293 Supervision of pregnancy with other poor reproductive or obstetric history, third trimester: Secondary | ICD-10-CM

## 2021-02-07 LAB — CBC
Hematocrit: 36.8 % (ref 34.0–46.6)
Hemoglobin: 12.3 g/dL (ref 11.1–15.9)
MCH: 30.1 pg (ref 26.6–33.0)
MCHC: 33.4 g/dL (ref 31.5–35.7)
MCV: 90 fL (ref 79–97)
Platelets: 301 10*3/uL (ref 150–450)
RBC: 4.09 x10E6/uL (ref 3.77–5.28)
RDW: 12.6 % (ref 11.7–15.4)
WBC: 9.7 10*3/uL (ref 3.4–10.8)

## 2021-02-07 LAB — RPR: RPR Ser Ql: NONREACTIVE

## 2021-02-07 LAB — GLUCOSE TOLERANCE, 2 HOURS W/ 1HR
Glucose, 1 hour: 80 mg/dL (ref 70–179)
Glucose, 2 hour: 129 mg/dL (ref 70–152)
Glucose, Fasting: 78 mg/dL (ref 70–91)

## 2021-02-07 LAB — HIV ANTIBODY (ROUTINE TESTING W REFLEX): HIV Screen 4th Generation wRfx: NONREACTIVE

## 2021-02-13 ENCOUNTER — Ambulatory Visit: Payer: Self-pay | Admitting: Physical Therapy

## 2021-02-13 ENCOUNTER — Other Ambulatory Visit: Payer: Self-pay

## 2021-02-13 ENCOUNTER — Ambulatory Visit (HOSPITAL_COMMUNITY): Payer: 59 | Attending: Cardiology

## 2021-02-13 ENCOUNTER — Encounter: Payer: Self-pay | Admitting: Physical Therapy

## 2021-02-13 DIAGNOSIS — R252 Cramp and spasm: Secondary | ICD-10-CM | POA: Insufficient documentation

## 2021-02-13 DIAGNOSIS — Z87891 Personal history of nicotine dependence: Secondary | ICD-10-CM | POA: Insufficient documentation

## 2021-02-13 DIAGNOSIS — Q796 Ehlers-Danlos syndrome, unspecified: Secondary | ICD-10-CM | POA: Diagnosis present

## 2021-02-13 DIAGNOSIS — I351 Nonrheumatic aortic (valve) insufficiency: Secondary | ICD-10-CM | POA: Diagnosis not present

## 2021-02-13 DIAGNOSIS — M6281 Muscle weakness (generalized): Secondary | ICD-10-CM | POA: Insufficient documentation

## 2021-02-13 DIAGNOSIS — O26893 Other specified pregnancy related conditions, third trimester: Secondary | ICD-10-CM | POA: Insufficient documentation

## 2021-02-13 DIAGNOSIS — M25651 Stiffness of right hip, not elsewhere classified: Secondary | ICD-10-CM | POA: Insufficient documentation

## 2021-02-13 LAB — ECHOCARDIOGRAM COMPLETE
Area-P 1/2: 3.77 cm2
S' Lateral: 3.4 cm

## 2021-02-13 NOTE — Therapy (Signed)
[facility] @ [place], [place], [address] Phone: [phone]   Fax:  [phone]  Physical Therapy Treatment  Patient Details  Name: Krista Lee MRN: [id number] Date of Birth: December 04, 1985 Referring Provider (PT): Elvera Maria, CNM   Encounter Date: 02/13/2021   PT End of Session - 02/13/21 1151     Visit Number 4    Date for PT Re-Evaluation 02/27/21    Authorization Type Friday    PT Start Time 1149    PT Stop Time 1230    PT Time Calculation (min) 41 min    Activity Tolerance Patient tolerated treatment well    Behavior During Therapy [facility] for tasks assessed/performed             Past Medical History:  Diagnosis Date   ADD (attention deficit disorder)    Allergy    no meds   Anemia    as adolescent   Anxiety    Depression    GERD (gastroesophageal reflux disease)    doesn't take any meds for this, diet controlled   Headache(784.0)    Heart murmur    as a child - no problems   History of bronchitis    as a teenager, no problems as adult   History of migraine    can't remember when the last one was   Insomnia    no meds   Panic attacks     Past Surgical History:  Procedure Laterality Date   CHOLECYSTECTOMY  10/22/2011   Procedure: LAPAROSCOPIC CHOLECYSTECTOMY;  Surgeon: Harl Bowie, MD;  Location: [place];  Service: General;  Laterality: N/A;   DILATION AND EVACUATION N/A 12/07/2019   Procedure: DILATATION AND EVACUATION;  Surgeon: Mora Bellman, MD;  Location: [place];  Service: Gynecology;  Laterality: N/A;  NEEDS ULTRASOUND GUIDANCE ANORA KIT SENT    HYSTEROSCOPY WITH RESECTOSCOPE N/A 04/28/2013   Procedure:  HYSTEROSCOPY ;  Surgeon: Princess Bruins, MD;  Location: [facility];  Service: Gynecology;  Laterality: N/A;   LAPAROSCOPY N/A 04/28/2013   Procedure: LAPAROSCOPY DIAGNOSTIC;  Surgeon: Princess Bruins, MD;  Location: [facility];  Service: Gynecology;   Laterality: N/A;  2 hrs. total   OPERATIVE ULTRASOUND N/A 12/07/2019   Procedure: OPERATIVE ULTRASOUND;  Surgeon: Mora Bellman, MD;  Location: [place];  Service: Gynecology;  Laterality: N/A;   WISDOM TOOTH EXTRACTION  1yr ago    There were no vitals filed for this visit.   Subjective Assessment - 02/13/21 1209     Subjective Pt states just had some pain when stepping to the side but no flare ups the past two weeks.    Patient Stated Goals not have the pain    Currently in Pain? No/denies                               [facility] Adult PT Treatment/Exercise - 02/13/21 0001       Neuro Re-ed    Neuro Re-ed Details  posture during functional exercises and engaged core and pelvic floor      Lumbar Exercises: Stretches   Other Lumbar Stretch Exercise knee flexion on step with push off not using UE for LE strength and stab - 15x each side      Lumbar Exercises: Standing   Other Standing Lumbar Exercises hip hinge bil, staggered, and single leg    Other Standing Lumbar  Exercises bird dip; modified dead with weighted ball rotations      Lumbar Exercises: Seated   Other Seated Lumbar Exercises half kneeling with green band;    Other Seated Lumbar Exercises dead lift with 10 lb - 20x                       PT Short Term Goals - 01/30/21 1131       PT SHORT TERM GOAL #1   Title independent with initial HEP    Status Achieved               PT Long Term Goals - 02/13/21 1733       PT LONG TERM GOAL #1   Title independent with advanced HEP    Status On-going      PT LONG TERM GOAL #2   Title able to elongate the pelvic floor muscles to promote relaxation for labor and delivery    Status On-going      PT LONG TERM GOAL #3   Title gluteal strength 5/5 for improved squat without pain    Status On-going      PT LONG TERM GOAL #4   Title Pt will report at least 50% less pain when standing at work    Baseline just  occasional last two weeks    Status Partially Met                   Plan - 02/13/21 1357     Clinical Impression Statement Pt is doing well with hip and core strengthening. Pt was able to add resistance to exercises and increased difficulty level today.  Added weight to modified dead lift and she did well with knees over toes on the step.  Pt will benefit from skilled PT to continue to address pelvic stability as she has been able to get through 2 weeks without any flare ups since last session but still having some minor pain when stepping and rotating.    PT Treatment/Interventions ADLs/Self Care Home Management;Biofeedback;Cryotherapy;Electrical Stimulation;Moist Heat;Therapeutic activities;Patient/family education;Manual techniques;Therapeutic exercise;Neuromuscular re-education;Passive range of motion;Taping    PT Next Visit Plan progress core strength,follow up on hip hinge, single leg, half kneel with weight and f/u on knee flexion knees over toes    PT Home Exercise Plan Access Code: [email or web address]    Consulted and Agree with Plan of Care Patient             Patient will benefit from skilled therapeutic intervention in order to improve the following deficits and impairments:  Difficulty walking, Increased fascial restricitons, Decreased strength, Decreased endurance, Pain, Impaired flexibility  Visit Diagnosis: Cramp and spasm  Muscle weakness (generalized)  Stiffness of right hip, not elsewhere classified     Problem List Patient Active Problem List   Diagnosis Date Noted   Ehlers-Danlos syndrome 02/06/2021   Encounter for supervision of normal pregnancy in first trimester 09/26/2020   Missed abortion    Female infertility 08/17/2019   Symptomatic cholelithiasis 09/25/2011   Biliary dyskinesia 09/25/2011   Abdominal pain 08/24/2011    Jule Ser, PT 02/13/2021, 5:39 PM  [facility] @ [place], [place], [address] Phone: [phone]   Fax:  [phone]  Name: FREDERICKA BOTTCHER MRN: [id number] Date of Birth: 1985/11/23

## 2021-02-18 IMAGING — US US OB LIMITED
1 series · 6 of 6 positions shown · non-contrast
Comparison: none

[Series 1: us ob limited · 6 of 6 slices shown]
[im 1/6]
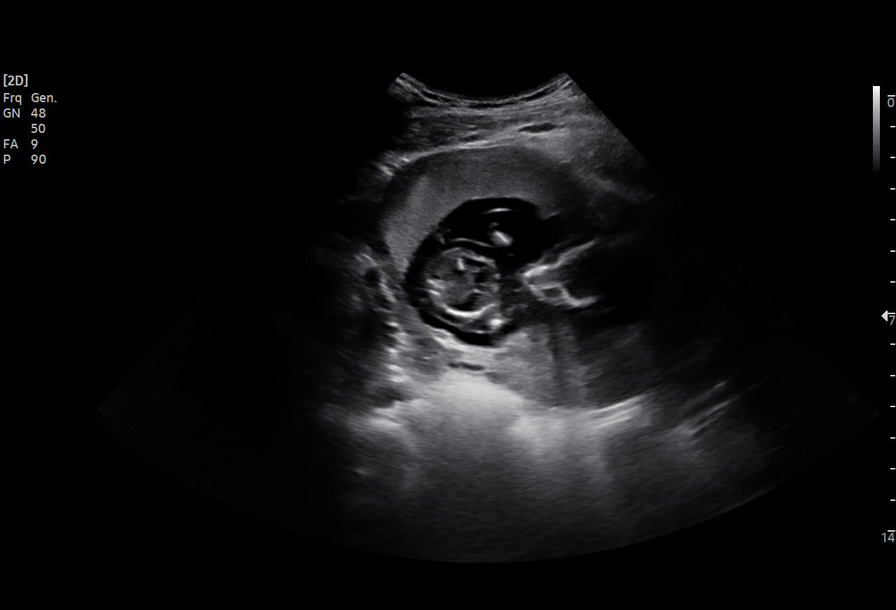
[im 2/6]
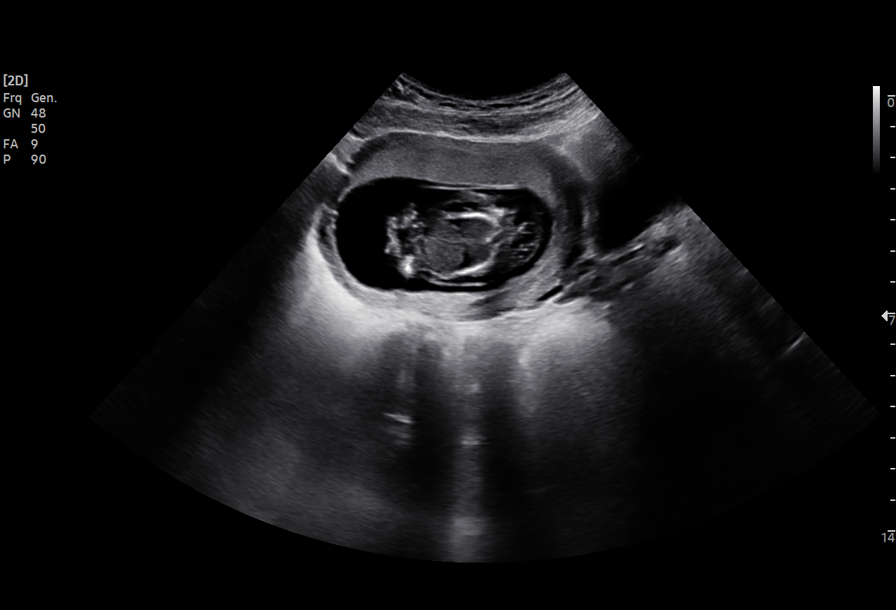
[im 3/6]
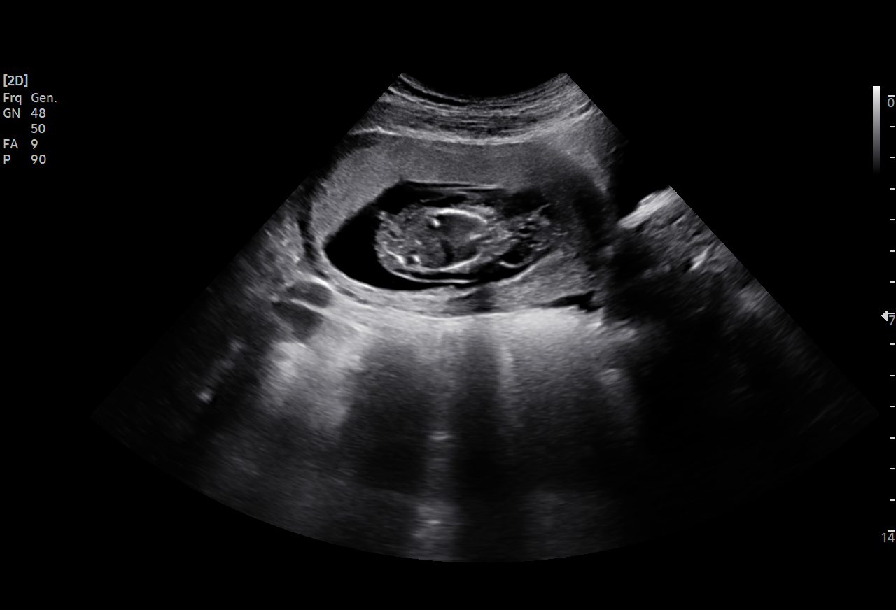
[im 4/6]
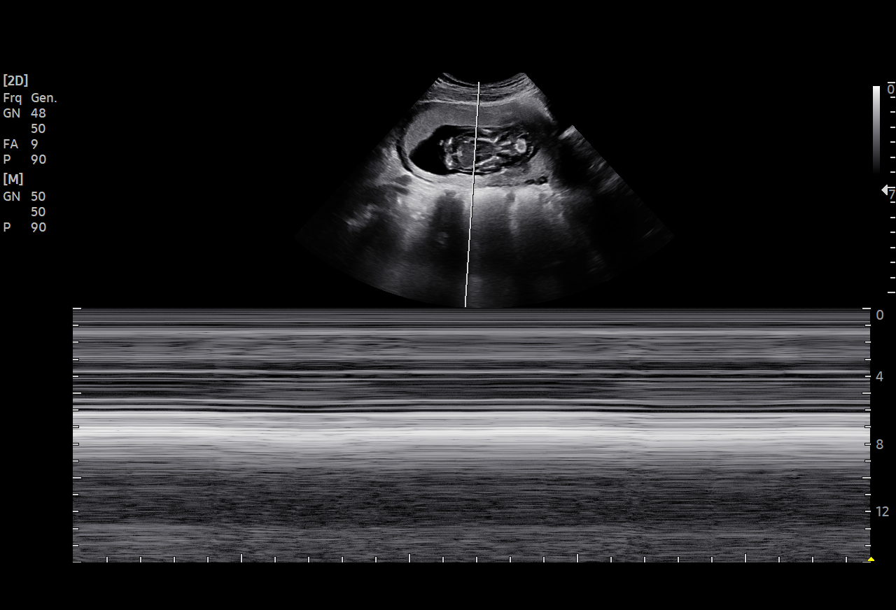
[im 5/6]
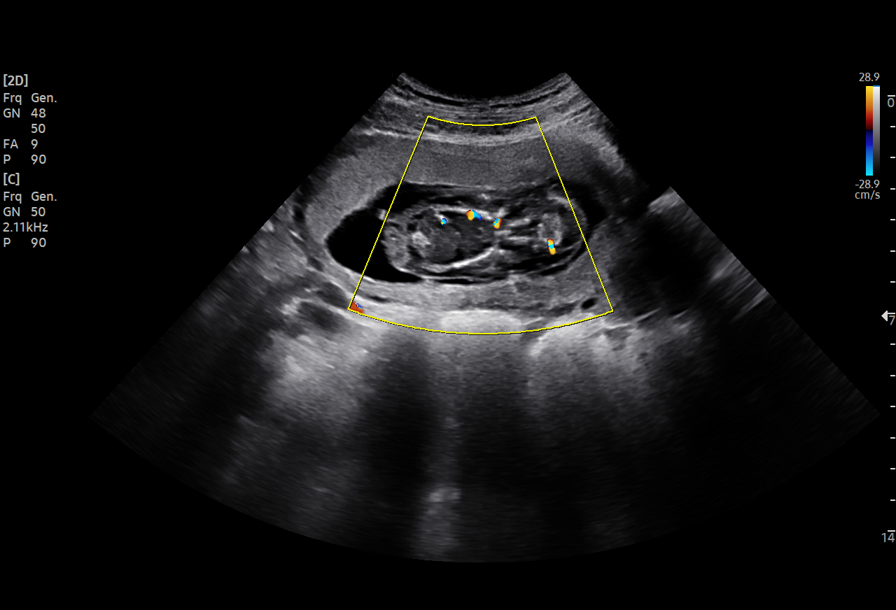
[im 6/6]
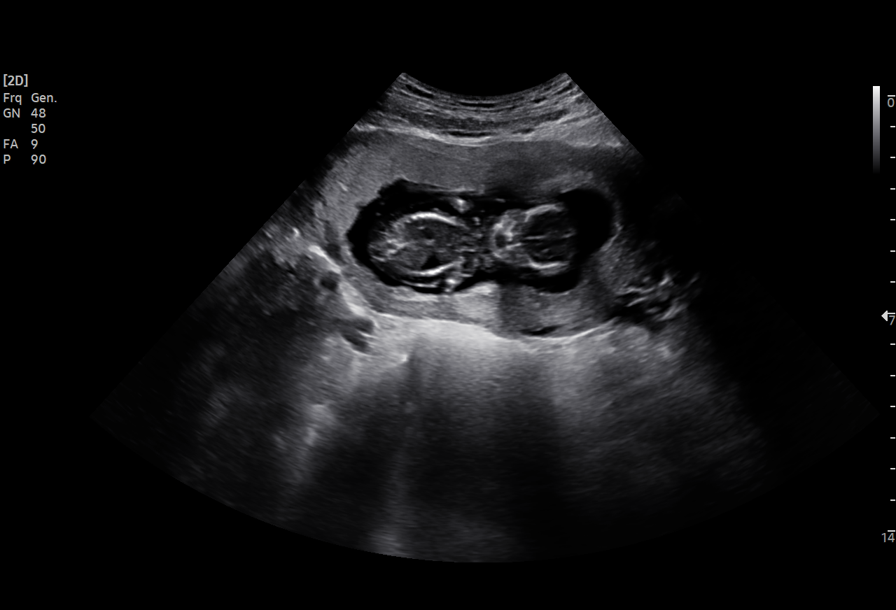

[6 of 6 positions shown; findings below may reference images not displayed]

Healthcare

 1   [HOSPITAL]                        [address].0      [facility]

Indications

 13 weeks gestation of pregnancy
 Unable to hear fetal heart tones as reason for  O76
 ultrasound
Fetal Evaluation

 Num Of Fetuses:          1
 Cardiac Activity:        Absent
Gestational Age

 Best:           13w 4d    Det. By:  U/S C R L  (10/27/19)      EDD:  06/02/20
Comments

 Non living fetus at 75w7d by LMP. Provider present during scan.
Impression

 Images reviewed. [place] IUP with no cardiac activity at
 75w7d. CNM present during scan and provided counseling to
 patient.

## 2021-02-20 ENCOUNTER — Ambulatory Visit (INDEPENDENT_AMBULATORY_CARE_PROVIDER_SITE_OTHER): Payer: 59 | Admitting: Clinical

## 2021-02-20 ENCOUNTER — Encounter: Payer: Self-pay | Admitting: Physical Therapy

## 2021-02-20 ENCOUNTER — Encounter: Payer: Self-pay | Admitting: Family Medicine

## 2021-02-20 ENCOUNTER — Other Ambulatory Visit: Payer: Self-pay

## 2021-02-20 ENCOUNTER — Ambulatory Visit: Payer: 59 | Attending: Advanced Practice Midwife | Admitting: Physical Therapy

## 2021-02-20 ENCOUNTER — Ambulatory Visit (INDEPENDENT_AMBULATORY_CARE_PROVIDER_SITE_OTHER): Payer: 59 | Admitting: Family Medicine

## 2021-02-20 VITALS — BP 121/69 | HR 75 | Wt 150.4 lb

## 2021-02-20 DIAGNOSIS — M25651 Stiffness of right hip, not elsewhere classified: Secondary | ICD-10-CM | POA: Insufficient documentation

## 2021-02-20 DIAGNOSIS — Z3A30 30 weeks gestation of pregnancy: Secondary | ICD-10-CM

## 2021-02-20 DIAGNOSIS — F411 Generalized anxiety disorder: Secondary | ICD-10-CM

## 2021-02-20 DIAGNOSIS — Q796 Ehlers-Danlos syndrome, unspecified: Secondary | ICD-10-CM

## 2021-02-20 DIAGNOSIS — R252 Cramp and spasm: Secondary | ICD-10-CM | POA: Diagnosis not present

## 2021-02-20 DIAGNOSIS — M6281 Muscle weakness (generalized): Secondary | ICD-10-CM | POA: Diagnosis present

## 2021-02-20 DIAGNOSIS — Z3480 Encounter for supervision of other normal pregnancy, unspecified trimester: Secondary | ICD-10-CM

## 2021-02-20 NOTE — Therapy (Signed)
[facility] @ [address] [place], [place], [address] Phone: [phone]   Fax:  [phone]  Physical Therapy Treatment  Patient Details  Name: Krista Lee MRN: [id number] Date of Birth: [birth year] Referring Provider (PT): Elvera Maria, CNM   Encounter Date: 02/20/2021   PT End of Session - 02/20/21 1412     Visit Number 5    Date for PT Re-Evaluation 02/27/21    Authorization Type Friday    PT Start Time 1148    PT Stop Time 1232    PT Time Calculation (min) 44 min    Activity Tolerance Patient tolerated treatment well    Behavior During Therapy [facility] for tasks assessed/performed             Past Medical History:  Diagnosis Date   ADD (attention deficit disorder)    Allergy    no meds   Anemia    as adolescent   Anxiety    Depression    GERD (gastroesophageal reflux disease)    doesn't take any meds for this, diet controlled   Headache(784.0)    Heart murmur    as a child - no problems   History of bronchitis    as a teenager, no problems as adult   History of migraine    can't remember when the last one was   Insomnia    no meds   Panic attacks     Past Surgical History:  Procedure Laterality Date   CHOLECYSTECTOMY  10/22/2011   Procedure: LAPAROSCOPIC CHOLECYSTECTOMY;  Surgeon: Harl Bowie, MD;  Location: [facility];  Service: General;  Laterality: N/A;   DILATION AND EVACUATION N/A 12/07/2019   Procedure: DILATATION AND EVACUATION;  Surgeon: Mora Bellman, MD;  Location: [facility];  Service: Gynecology;  Laterality: N/A;  NEEDS ULTRASOUND GUIDANCE ANORA KIT SENT    HYSTEROSCOPY WITH RESECTOSCOPE N/A 04/28/2013   Procedure:  HYSTEROSCOPY ;  Surgeon: Princess Bruins, MD;  Location: [facility];  Service: Gynecology;  Laterality: N/A;   LAPAROSCOPY N/A 04/28/2013   Procedure: LAPAROSCOPY DIAGNOSTIC;  Surgeon: Princess Bruins, MD;  Location: [facility];  Service: Gynecology;   Laterality: N/A;  2 hrs. total   OPERATIVE ULTRASOUND N/A 12/07/2019   Procedure: OPERATIVE ULTRASOUND;  Surgeon: Mora Bellman, MD;  Location: [facility];  Service: Gynecology;  Laterality: N/A;   WISDOM TOOTH EXTRACTION  31yr ago    There were no vitals filed for this visit.   Subjective Assessment - 02/20/21 1436     Subjective Pt states she is doing well and not had any flare ups.  Pt is comfortable with doing her HEP for maintenance of things throughout the next 10 weeks and returning as needed    Patient Stated Goals not have the pain    Currently in Pain? No/denies                               [facility]       Self-Care   Self-Care Other Self-Care Comments    Other Self-Care Comments  belly bands      Exercises   Exercises Other Exercises    Other Exercises  review HEP      Manual Therapy   Manual Therapy Soft tissue mobilization;Taping    Soft tissue mobilization lumbar and gluteals, fascial release around Rt anterior and lower  quadrant    Kinesiotex Facilitate Muscle      Kinesiotix   Facilitate Muscle  rectus ab and transversus ab bil                       PT Short Term Goals - 01/30/21 1131       PT SHORT TERM GOAL #1   Title independent with initial HEP    Status Achieved               PT Long Term Goals - 02/20/21 1502       PT LONG TERM GOAL #1   Title independent with advanced HEP    Status Achieved      PT LONG TERM GOAL #2   Title able to elongate the pelvic floor muscles to promote relaxation for labor and delivery    Baseline educated on relaxing with breathing techniques    Status Partially Met      PT LONG TERM GOAL #3   Title gluteal strength 5/5 for improved squat without pain    Baseline no pain    Status Achieved      PT LONG TERM GOAL #4   Title Pt will report at least 50% less pain when standing at work    Baseline no pain    Status  Achieved                   Plan - 02/20/21 1447     Clinical Impression Statement Pt has met goals for pain management.  Today's session reviewied HEP as well as alternative ways to support the pelvis including baby belly band and other abdominal support, ktaping technique for abdominal support    PT Treatment/Interventions ADLs/Self Care Home Management;Biofeedback;Cryotherapy;Electrical Stimulation;Moist Heat;Therapeutic activities;Patient/family education;Manual techniques;Therapeutic exercise;Neuromuscular re-education;Passive range of motion;Taping    PT Next Visit Plan d/c    PT Home Exercise Plan Access Code: [email or web address]    Consulted and Agree with Plan of Care Patient             Patient will benefit from skilled therapeutic intervention in order to improve the following deficits and impairments:  Difficulty walking, Increased fascial restricitons, Decreased strength, Decreased endurance, Pain, Impaired flexibility  Visit Diagnosis: Cramp and spasm  Muscle weakness (generalized)  Stiffness of right hip, not elsewhere classified     Problem List Patient Active Problem List   Diagnosis Date Noted   Ehlers-Danlos syndrome 02/06/2021   Supervision of other normal pregnancy, antepartum 09/26/2020   Missed abortion    Female infertility 08/17/2019   Symptomatic cholelithiasis 09/25/2011   Biliary dyskinesia 09/25/2011   Abdominal pain 08/24/2011    Jule Ser, PT 02/20/2021, 3:07 PM  [facility] @ [address] [place], [place], [address] Phone: [phone]   Fax:  [phone]  Name: Krista Lee MRN: [id number] Date of Birth: [birth year]  PHYSICAL THERAPY DISCHARGE SUMMARY  Visits from Start of Care: 5  Current functional level related to goals / functional outcomes:   See above goals  Remaining deficits: See above details   Education / Equipment: HEP   Patient agrees to discharge.  Patient goals were met. Patient is being discharged due to being pleased with the current functional level.  Gustavus Bryant, PT 02/20/21 3:07 PM

## 2021-02-20 NOTE — Progress Notes (Signed)
Diagnosis: F41.9 Time: [id number] CPT Code: [id number], SEEN  [name] was seen remotely using secure video conferencing. She was in her home in [place] and the therapist was in her office at the time of the appointment. She shared that she has been doing well overall, and has continued to notice a reduction in overall anxiety. Session focused on recent events in her husband's life, including his own struggles with atrial fibrillation and kidney problems. Therapist provided an opportunity to process. She is scheduled to be seen again in one month.  Treatment Plan Client Abilities/Strengths  Krista Lee presented as insightful and motivated, with clear goals for therapy.  Client Treatment Preferences  Krista Lee would like to begin with monthly appointments, and increase frequency once she has hit her insurance deductible.  Client Statement of Needs  Krista Lee is seeking CBT to address anxiety and relationship challenges.  Treatment Level  Monthly  Symptoms  Anxiety : vivid, persistent thoughts of worst case scenarios that are difficult to disengage from (Status: maintained). Relational Challenges: Difficulty with mutual triggering of uncomfortable emotions in marriage (Status: maintained).  Problems Addressed  New Description, New Description  Goals 1. Difficulty in relationship with husband Objective Development of strategies to regulate emotional response to husband's stress Target Date: 2021-03-02 Frequency: Biweekly  Progress: 0 Modality: individual  Related Interventions Therapist will work with Krista Lee to develop communication strategies by helping her to consider how she might respond to different situations and offering opportunities to practice, such as through written exercises and role plays Therapist will work with Krista Lee to develop her communication strategies, including labeling and expressing her emotions and using "I" statements 2. Persistent anxiety triggered by thoughts of worse  case scenarios that feel real Objective Decrease in frequency and severity of anxiety, increase in overall well bein Target Date: 2021-03-02 Frequency: Biweekly  Progress: 0 Modality: individual  Related Interventions Therapist will provide referrals for additional resources as appropriate Therapist will provide Krista Lee with strategies to regulate her emotions, including meditation, breathing exercises, mindfulness, and self-care. Therapist will help Krista Lee to identify and disengage from maladaptive thought patterns using CBT-based strategies Therapist will provide Krista Lee with opportunities to process her experiences in session Diagnosis Axis none 300.00 (Anxiety state, unspecified) - Open - [Signifier: n/a]    Conditions For Discharge Achievement of treatment goals and objectives Therapist Signature ___________________________ Patient Signature ___________________________    Myrtie Cruise, PhD               Myrtie Cruise, PhD

## 2021-02-20 NOTE — Progress Notes (Signed)
° ° °  Subjective:  Krista Lee is a 36 y.o. G2P0010 at [redacted]w[redacted]d being seen today for ongoing prenatal care.  She is currently monitored for the following issues for this high-risk pregnancy and has Female infertility; Missed abortion; Supervision of other normal pregnancy, antepartum; and Ehlers-Danlos syndrome on their problem list.  Patient reports no complaints.  Wants to know what normal fetal movement should be. Reports active movement today, but notices less while she is at work since she started feeling movement around 16+ weeks. When she pays attention she feels movement again. Contractions: Not present. Vag. Bleeding: None.  Movement: Present. Denies leaking of fluid.   The following portions of the patient's history were reviewed and updated as appropriate: allergies, current medications, past family history, past medical history, past social history, past surgical history and problem list.   Objective:   Vitals:   02/20/21 1437  BP: 121/69  Pulse: 75  Weight: 150 lb 6.4 oz (68.2 kg)    Fetal Status: Fetal Heart Rate (bpm): 140 Fundal Height: 30 cm Movement: Present     General:  Alert, oriented and cooperative. Patient is in no acute distress.  Skin: Skin is warm and dry. No rash noted.   Cardiovascular: Normal heart rate noted  Respiratory: Normal respiratory effort, no problems with respiration noted  Abdomen: Soft, gravid, appropriate for gestational age. Pain/Pressure: Present     Pelvic:  Cervical exam deferred        Extremities: Normal range of motion.  Edema: None  Mental Status: Normal mood and affect. Normal behavior. Normal judgment and thought content.    Assessment and Plan:  Pregnancy: G2P0010 at [redacted]w[redacted]d  1. Supervision of other normal pregnancy, antepartum Doing well with normal fetal movement. Discussed expectations and when to do kick counts at home.   2. [redacted] weeks gestation of pregnancy  3. Ehlers-Danlos syndrome No formal diagnosis, mentioned at the  beginning of pregnancy by several symptoms. Echo WNL. Plans to consult with anesthesia prior to delivery, she has been under general several times in the past without concern.   Preterm labor symptoms and general obstetric precautions including but not limited to vaginal bleeding, contractions, leaking of fluid and fetal movement were reviewed in detail with the patient. Please refer to After Visit Summary for other counseling recommendations.   Return in about 2 weeks (around 03/06/2021) for [place].   Patriciaann Clan, DO

## 2021-02-20 NOTE — Progress Notes (Signed)
Pt presents for ROB without complaints today.  

## 2021-02-24 ENCOUNTER — Telehealth: Payer: Self-pay | Admitting: *Deleted

## 2021-02-24 NOTE — Telephone Encounter (Signed)
TC from pt reporting increased fetal movement while husband was "talking to the baby" last night. States movement went on for a few minutes and was rapid and "in a pattern". Continued to feel movement following episode but felt it was somewhat decreased. Feeling FM today but reports as "sluggish". Encouraged to rest, drink water and perform fetal kick count X 1 hour. If fewer than 4 movements felt pt advised to go to MAU for evaluation.

## 2021-02-27 ENCOUNTER — Encounter: Payer: Self-pay | Admitting: Physical Therapy

## 2021-03-06 ENCOUNTER — Other Ambulatory Visit: Payer: Self-pay | Admitting: *Deleted

## 2021-03-06 ENCOUNTER — Ambulatory Visit: Payer: 59 | Attending: Obstetrics | Admitting: Obstetrics

## 2021-03-06 ENCOUNTER — Encounter: Payer: Self-pay | Admitting: Physical Therapy

## 2021-03-06 ENCOUNTER — Ambulatory Visit: Payer: 59 | Attending: Obstetrics and Gynecology | Admitting: *Deleted

## 2021-03-06 ENCOUNTER — Encounter: Payer: Self-pay | Admitting: *Deleted

## 2021-03-06 ENCOUNTER — Other Ambulatory Visit: Payer: Self-pay

## 2021-03-06 ENCOUNTER — Ambulatory Visit (INDEPENDENT_AMBULATORY_CARE_PROVIDER_SITE_OTHER): Payer: 59 | Admitting: Advanced Practice Midwife

## 2021-03-06 ENCOUNTER — Ambulatory Visit (HOSPITAL_BASED_OUTPATIENT_CLINIC_OR_DEPARTMENT_OTHER): Payer: 59

## 2021-03-06 ENCOUNTER — Other Ambulatory Visit: Payer: Self-pay | Admitting: Obstetrics and Gynecology

## 2021-03-06 VITALS — BP 106/58 | HR 66

## 2021-03-06 VITALS — BP 101/66 | HR 77 | Wt 151.8 lb

## 2021-03-06 DIAGNOSIS — O09293 Supervision of pregnancy with other poor reproductive or obstetric history, third trimester: Secondary | ICD-10-CM | POA: Insufficient documentation

## 2021-03-06 DIAGNOSIS — H539 Unspecified visual disturbance: Secondary | ICD-10-CM

## 2021-03-06 DIAGNOSIS — Z3A32 32 weeks gestation of pregnancy: Secondary | ICD-10-CM

## 2021-03-06 DIAGNOSIS — Z3480 Encounter for supervision of other normal pregnancy, unspecified trimester: Secondary | ICD-10-CM

## 2021-03-06 DIAGNOSIS — O4443 Low lying placenta NOS or without hemorrhage, third trimester: Secondary | ICD-10-CM

## 2021-03-06 DIAGNOSIS — O4100X0 Oligohydramnios, unspecified trimester, not applicable or unspecified: Secondary | ICD-10-CM

## 2021-03-06 DIAGNOSIS — O99891 Other specified diseases and conditions complicating pregnancy: Secondary | ICD-10-CM | POA: Diagnosis not present

## 2021-03-06 DIAGNOSIS — O4403 Placenta previa specified as without hemorrhage, third trimester: Secondary | ICD-10-CM

## 2021-03-06 DIAGNOSIS — Q796 Ehlers-Danlos syndrome, unspecified: Secondary | ICD-10-CM

## 2021-03-06 DIAGNOSIS — O368130 Decreased fetal movements, third trimester, not applicable or unspecified: Secondary | ICD-10-CM

## 2021-03-06 NOTE — Progress Notes (Signed)
Notified pt of plan for NST at appt today. Pt reports fetal hiccups for 12 min this AM. ?

## 2021-03-06 NOTE — Progress Notes (Signed)
? ?PRENATAL VISIT NOTE ? ?Subjective:  ?Krista Lee is a 36 y.o. G2P0010 at [redacted]w[redacted]d being seen today for ongoing prenatal care.  She is currently monitored for the following issues for this low-risk pregnancy and has Female infertility; Missed abortion; Supervision of other normal pregnancy, antepartum; and Ehlers-Danlos syndrome on their problem list. ? ?Patient reports  occasional vision changes, increased/rhythmic fetal movement .  Contractions: Not present. Vag. Bleeding: None.  Movement: Present. Denies leaking of fluid.  ? ?The following portions of the patient's history were reviewed and updated as appropriate: allergies, current medications, past family history, past medical history, past social history, past surgical history and problem list.  ? ?Objective:  ? ?Vitals:  ? 03/06/21 1333  ?BP: 101/66  ?Pulse: 77  ?Weight: 151 lb 12.8 oz (68.9 kg)  ? ? ?Fetal Status: Fetal Heart Rate (bpm): 135   Movement: Present    ? ?General:  Alert, oriented and cooperative. Patient is in no acute distress.  ?Skin: Skin is warm and dry. No rash noted.   ?Cardiovascular: Normal heart rate noted  ?Respiratory: Normal respiratory effort, no problems with respiration noted  ?Abdomen: Soft, gravid, appropriate for gestational age.  Pain/Pressure: Present     ?Pelvic: Cervical exam deferred        ?Extremities: Normal range of motion.     ?Mental Status: Normal mood and affect. Normal behavior. Normal judgment and thought content.  ? ?Assessment and Plan:  ?Pregnancy: G2P0010 at [redacted]w[redacted]d ?1. Supervision of other normal pregnancy, antepartum ?--Anticipatory guidance about next visits/weeks of pregnancy given.  ?--Next appt in 2 weeks ?--May be interested in waterbirth or shower hydrotherapy.  Will enroll in class.   ? ?2. Ehlers-Danlos syndrome ?--Likely mild version, no formal diagnosis but growth [place], anesthesia consult, and maternal echo per MFM all completed and normal.   ?--Pt with no mobility concerns ? ?3. [redacted] weeks gestation  of pregnancy ? ? ?4. Disorder of fetal movement in third trimester, single or unspecified fetus ?--Pt reports increased in movement 1-2 times per day, sometimes while her husband talks to baby. Hiccups or other rhythmic movement occurs sometimes 3-4 times daily.  ?--Reassurance provided that increased fetal movement is not associated with poor outcomes, only decreased movement ?--NST performed today given pt hx of pregnancy loss and concerns about movement and NST reactive, with 15 x 15 accels today ?--MFM [place] as scheduled today ? ?5. Visual disturbances ?--Pt has had 2-3 episodes of seeing "lines around peripheral vision" which appear to have occurred only when pt with prolonged standing (in the shower, while washing dishes). Last on a few seconds then resolve and have no associated symptoms. ?--May be circulatory in nature, no HTN, no headache or dizziness ?--Notify office if worsening or do not resolve when pt sits down ?  ?Preterm labor symptoms and general obstetric precautions including but not limited to vaginal bleeding, contractions, leaking of fluid and fetal movement were reviewed in detail with the patient. ?Please refer to After Visit Summary for other counseling recommendations.  ? ?No follow-ups on file. ? ?Future Appointments  ?Date Time Provider Department Center  ?03/13/2021  8:30 AM [facility] NURSE [facility]  ?03/13/2021  8:45 AM [facility] US6 [facility]  ?03/20/2021 10:00 AM Mendelson, Hulda Humphrey, PhD [facility] None  ?03/20/2021  1:45 PM [facility] NURSE [facility]  ?03/20/2021  2:00 PM [facility] US1 [facility]  ?03/20/2021  4:10 PM Leftwich-Kirby, Wilmer Floor, CNM [facility] None  ?03/27/2021  3:15 PM [facility] NURSE [facility]  ?  03/27/2021  3:30 PM [facility] US3 [facility]  ?04/03/2021 10:00 AM Mendelson, Hulda Humphrey, PhD [facility] None  ?04/03/2021  1:30 PM Leftwich-Kirby, Wilmer Floor, CNM [facility] None  ?04/03/2021  3:45 PM [facility] NURSE [facility]  ?04/03/2021  4:00 PM [facility] US1 [facility]  ?04/10/2021  1:30 PM Constant,  Gigi Gin, MD [facility] None  ?04/17/2021 10:00 AM Mendelson, Hulda Humphrey, PhD [facility] None  ?04/17/2021  1:30 PM Constant, Gigi Gin, MD [facility] None  ?04/24/2021  1:30 PM Marny Lowenstein, PA-C [facility] None  ? ? ?Sharen Counter, CNM  ?

## 2021-03-06 NOTE — Progress Notes (Signed)
Pt called after hours nurse line reporting fetal hiccups x 2 in one hour and concern for fetus. Dicussed with Sharen Counter, CNM. Pt coming for visit today will plan to do NST. ?

## 2021-03-07 ENCOUNTER — Other Ambulatory Visit: Payer: Self-pay | Admitting: Obstetrics and Gynecology

## 2021-03-07 ENCOUNTER — Other Ambulatory Visit: Payer: Self-pay | Admitting: *Deleted

## 2021-03-07 DIAGNOSIS — O09293 Supervision of pregnancy with other poor reproductive or obstetric history, third trimester: Secondary | ICD-10-CM

## 2021-03-07 DIAGNOSIS — O4443 Low lying placenta NOS or without hemorrhage, third trimester: Secondary | ICD-10-CM

## 2021-03-07 DIAGNOSIS — O4193X1 Disorder of amniotic fluid and membranes, unspecified, third trimester, fetus 1: Secondary | ICD-10-CM

## 2021-03-07 NOTE — Progress Notes (Signed)
MFM Note ? ?Krista Lee was seen for a follow up exam due to a low lying placenta noted on her prior exams. She denies any problems since her last exam and reports feeling vigorous fetal movements throughout the day.  ? ?She was informed that the fetal growth (EFW 4 pounds 4 ounces, 38th percentile) appears appropriate for her gestational age. Low normal amniotic fluid with a total AFI of 6.4 cm was noted.  ? ?She denies any leakage of fluid although she does note occasional wetness on her underwear.  ? ?A BPP performed today was 8/8.  ? ?The previously noted low-lying placenta has resolved.  This was confirmed using transvaginal ultrasound.   ? ?The patient was advised that she may attempt a vaginal delivery should she desire.   ? ?PPROM precautions were reviewed today.  She was advised to drink plenty of fluid and to go to the MAU for evaluation should she feel a large gush of fluid.   ? ?Due to the low normal amniotic fluid levels, we will continue to follow her with weekly fetal testing and amniotic fluid assessments.   ? ?She understands that should the amniotic fluid continue to decrease, that an earlier delivery may be recommended.  The goal for her delivery would be at 37 weeks.   ? ?A BPP was scheduled in 1 week.   ? ?The patient stated that all of her questions were answered. ? ?A total of 20 minutes was spent counseling and coordinating the care for this patient.  Greater than 50% of the time was spent in direct face-to-face contact. ?

## 2021-03-08 ENCOUNTER — Ambulatory Visit (INDEPENDENT_AMBULATORY_CARE_PROVIDER_SITE_OTHER): Payer: 59 | Admitting: Student

## 2021-03-08 ENCOUNTER — Other Ambulatory Visit: Payer: Self-pay

## 2021-03-08 VITALS — BP 105/67 | HR 87 | Wt 152.8 lb

## 2021-03-08 DIAGNOSIS — Z0371 Encounter for suspected problem with amniotic cavity and membrane ruled out: Secondary | ICD-10-CM

## 2021-03-08 DIAGNOSIS — Z3A32 32 weeks gestation of pregnancy: Secondary | ICD-10-CM

## 2021-03-08 NOTE — Progress Notes (Signed)
? ?PRENATAL VISIT NOTE ? ?Subjective:  ?Krista Lee is a 36 y.o. G2P0010 at [redacted]w[redacted]d who presents today for follow-up monitoring due to patient concern regarding U/S performed on 3/6. Patient stated that U/S findings were normal, but made mention of low amniotic fluid. Patient spoke to a provider and was instructed to make appointment in the clinic to ensure membranes of not ruptured. Patient has not noticed any leakage of fluid and has increased fluid intake since U/S. Patient has no pregnancy related concerns and endorses fetal movement. She denies vaginal concerns including discharge, bleeding, leaking, itching, and burning. Otherwise she is currently being monitored for supervision of a low-risk pregnancy with problems as listed below.  ? ? ?Patient Active Problem List  ? Diagnosis Date Noted  ? Ehlers-Danlos syndrome 02/06/2021  ? Supervision of other normal pregnancy, antepartum 09/26/2020  ? Missed abortion   ? Female infertility 08/17/2019  ? ? ?The following portions of the patient's history were reviewed and updated as appropriate: allergies, current medications, past family history, past medical history, past social history, past surgical history and problem list. Problem list updated. ? ?Objective:  ? ?Vitals:  ? 03/08/21 0836  ?BP: 105/67  ?Pulse: 87  ?Weight: 152 lb 12.8 oz (69.3 kg)  ? ? ?Fetal Status: Fetal Heart Rate (bpm): 135        ? ?General:  Alert, oriented and cooperative. Patient is in no acute distress.  ?Skin: Skin is warm and dry.   ?Cardiovascular: Regular rate and rhythm.  ?Respiratory: Normal respiratory effort. CTA-Bilaterally  ?Abdomen: Soft, gravid, appropriate for gestational age.  ?Pelvic: .Normal appearing external genitalia and urethral meatus; normal appearing vaginal mucosa and cervix.  No abnormal discharge noted.           ?Extremities: Normal range of motion.     ?Mental Status: Normal mood and affect. Normal behavior. Normal judgment and thought content.  ? ?Assessment and  Plan:  ?Pregnancy: G2P0010 at [redacted]w[redacted]d ? ?Encounter for suspected premature rupture of membranes, with rupture of membranes not found ?Negative Nitrazine Test, Normal Pelvic Exam ?Precautions for PPROM reviewed ?Follow-up US scheduled 3/13, and weekly thereafter  ?Follow-up ROB scheduled 3/20 ?Additional  counseling recommendations and precautions sent to patient via MyChart ? ?Encounter [redacted] weeks gestation of pregnancy ? ?Preterm labor symptoms and general obstetric precautions including but not limited to vaginal bleeding, contractions, leaking of fluid and fetal movement were reviewed with the patient. ? ?Please refer to After Visit Summary for other counseling recommendations.  ? ?No follow-ups on file. ? ?Future Appointments  ?Date Time Provider Department Center  ?03/13/2021  8:30 AM [facility] NURSE [facility]  ?03/13/2021  8:45 AM [facility] US6 [facility]  ?03/20/2021 10:00 AM Mendelson, Hulda Humphrey, PhD [facility] None  ?03/20/2021  1:45 PM [facility] NURSE [facility]  ?03/20/2021  2:00 PM [facility] US1 [facility]  ?03/20/2021  4:10 PM Leftwich-Kirby, Wilmer Floor, CNM [facility] None  ?03/27/2021  3:15 PM [facility] NURSE [facility]  ?03/27/2021  3:30 PM [facility] US3 [facility]  ?04/03/2021 10:00 AM Mendelson, Hulda Humphrey, PhD [facility] None  ?04/03/2021  1:30 PM Leftwich-Kirby, Wilmer Floor, CNM [facility] None  ?04/03/2021  3:45 PM [facility] NURSE [facility]  ?04/03/2021  4:00 PM [facility] US1 [facility]  ?04/10/2021  1:30 PM Constant, Gigi Gin, MD [facility] None  ?04/17/2021 10:00 AM Mendelson, Hulda Humphrey, PhD [facility] None  ?04/17/2021  1:30 PM Constant, Gigi Gin, MD [facility] None  ?04/24/2021  1:30 PM Marny Lowenstein, PA-C [facility] None  ? ?  Corlis Hove, [facility], IBCLC ?03/08/21  ? ? ?

## 2021-03-08 NOTE — Progress Notes (Signed)
Pt presents for concerns for ROM. States that she had amniotic fluid that was "low-normal" on her last ultrasound on 3/6.  ?Denies cramping, contractions, pain. Denies vaginal bleeding, leakage of fluid or discharge.  ?Concerned about fetal hiccups. ?

## 2021-03-10 ENCOUNTER — Inpatient Hospital Stay (HOSPITAL_BASED_OUTPATIENT_CLINIC_OR_DEPARTMENT_OTHER): Payer: 59

## 2021-03-10 ENCOUNTER — Other Ambulatory Visit: Payer: Self-pay

## 2021-03-10 ENCOUNTER — Encounter (HOSPITAL_COMMUNITY): Payer: Self-pay | Admitting: Obstetrics and Gynecology

## 2021-03-10 ENCOUNTER — Inpatient Hospital Stay (HOSPITAL_COMMUNITY)
Admission: AD | Admit: 2021-03-10 | Discharge: 2021-03-10 | Disposition: A | Discharge status: Home / Self Care | Payer: 59 | Attending: Obstetrics and Gynecology | Admitting: Obstetrics and Gynecology

## 2021-03-10 DIAGNOSIS — W19XXXA Unspecified fall, initial encounter: Secondary | ICD-10-CM

## 2021-03-10 DIAGNOSIS — Z3480 Encounter for supervision of other normal pregnancy, unspecified trimester: Secondary | ICD-10-CM

## 2021-03-10 DIAGNOSIS — Z3A32 32 weeks gestation of pregnancy: Secondary | ICD-10-CM | POA: Diagnosis not present

## 2021-03-10 DIAGNOSIS — O4103X0 Oligohydramnios, third trimester, not applicable or unspecified: Secondary | ICD-10-CM | POA: Insufficient documentation

## 2021-03-10 DIAGNOSIS — O9A213 Injury, poisoning and certain other consequences of external causes complicating pregnancy, third trimester: Secondary | ICD-10-CM

## 2021-03-10 DIAGNOSIS — O4193X1 Disorder of amniotic fluid and membranes, unspecified, third trimester, fetus 1: Secondary | ICD-10-CM | POA: Insufficient documentation

## 2021-03-10 DIAGNOSIS — X58XXXA Exposure to other specified factors, initial encounter: Secondary | ICD-10-CM | POA: Insufficient documentation

## 2021-03-10 DIAGNOSIS — Z3A33 33 weeks gestation of pregnancy: Secondary | ICD-10-CM | POA: Insufficient documentation

## 2021-03-10 HISTORY — DX: Urinary tract infection, site not specified: N39.0

## 2021-03-10 NOTE — MAU Provider Note (Signed)
?History  ?  ? ?CSN: [id number] ? ?Arrival date and time: 03/10/21 1531 ? ? None  ?  ? ?Chief Complaint  ?Patient presents with  ? Fall  ? ?HPI ?Krista Lee. Daw is a 36 y.o. G2P0010 at 65w6dwho presents to MAU for fall. 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 She did not hit her abdomen. She denies vaginal bleeding, leaking fluid, contractions or pain. Endorses active fetal movement.  ? ?[facility] Pregnancy complicated by history of 14 weeks demise/T21, Ehler's-Danlos Syndrome and having low/normal AFI that was noted on 3/6.  ? ?OB History   ? ? Gravida  ?2  ? Para  ?0  ? Term  ?0  ? Preterm  ?0  ? AB  ?1  ? Living  ?0  ?  ? ? SAB  ?1  ? IAB  ?0  ? Ectopic  ?0  ? Multiple  ?0  ? Live Births  ?0  ?   ?  ?  ? ?Past Medical History:  ?Diagnosis Date  ? ADD (attention deficit disorder)   ? Allergy   ? no meds  ? Anemia   ? as adolescent  ? Anxiety   ? Depression   ? GERD (gastroesophageal reflux disease)   ? doesn't take any meds for this, diet controlled  ? Headache(784.0)   ? Heart murmur   ? as a child - no problems  ? History of bronchitis   ? as a teenager, no problems as adult  ? History of migraine   ? can't remember when the last one was  ? Insomnia   ? no meds  ? Panic attacks   ? Symptomatic cholelithiasis 09/25/2011  ? UTI (urinary tract infection)   ? ? ?Past Surgical History:  ?Procedure Laterality Date  ? CHOLECYSTECTOMY  10/22/2011  ? Procedure: LAPAROSCOPIC CHOLECYSTECTOMY;  Surgeon: DHarl Bowie MD;  [place]  Service: General;  Laterality: N/A;  ? [place]  ? Procedure: DILATATION AND EVACUATION;  Surgeon: CMora Bellman MD;  [place]  Service: Gynecology;  Laterality: N/A;  NEEDS ULTRASOUND GUIDANCE ?ANORA KIT SENT   ? HYSTEROSCOPY WITH RESECTOSCOPE N/A 04/28/2013  ? Procedure:   HYSTEROSCOPY ;  Surgeon: MPrincess Bruins MD;  [place] [facility];  Service: Gynecology;  Laterality: N/A;  ? LAPAROSCOPY N/A 04/28/2013  ? Procedure: LAPAROSCOPY DIAGNOSTIC;  Surgeon: MPrincess Bruins MD;  [place] [facility];  Service: Gynecology;  Laterality: N/A;  2 hrs. total  ? OPERATIVE ULTRASOUND N/A 12/07/2019  ? Procedure: OPERATIVE ULTRASOUND;  Surgeon: CMora Bellman MD;  [place]  Service: Gynecology;  Laterality: N/A;  ? WISDOM TOOTH EXTRACTION  68yrago  ? ? ?Family History  ?Problem Relation Age of Onset  ? Hypertension Mother   ? Diverticulitis Mother   ? Hyperlipidemia Mother   ? Arthritis Father   ? Rheum arthritis Paternal Grandmother   ? ? ?Social History  ? ?Tobacco Use  ? Smoking status: Former  ?  Packs/day: 0.50  ?  Years: 8.00  ?  Pack years: 4.00  ?  Types: Cigarettes  ?  Quit date: 08/2019  ?  Years since quitting: 1.6  ? Smokeless tobacco: Former  ?  Quit date: 01/02/2011  ?Vaping Use  ? Vaping Use: Never  used  ?Substance Use Topics  ? Alcohol use: Not Currently  ?  Alcohol/week: 14.0 standard drinks  ?  Types: 14 Standard drinks or equivalent per week  ?  Comment: not since confirmed pregnancy  ? Drug use: No  ? ? ?Allergies:  ?Allergies  ?Allergen Reactions  ? Citrus Nausea And Vomiting  ?  Oranges  ? Ibuprofen Other (See Comments)  ?  Childhood reaction of high fever  ? Percocet [Oxycodone-Acetaminophen] Nausea Only  ?  Pt can take regular tylenol but in combination with codeine products is makes her very sick to her stomach.  ? ? ?Medications Prior to Admission  ?Medication Sig Dispense Refill Last Dose  ? acetaminophen (TYLENOL) 500 MG tablet Take 1,000 mg by mouth every 6 (six) hours as needed for moderate pain.   Past Month  ? cetirizine (ZYRTEC) 10 MG tablet Take 10 mg by mouth daily.   Past Month  ? famotidine (PEPCID) 10 MG tablet Take 10 mg by mouth 2 (two) times daily.   03/10/2021  ? Prenatal Vit-Fe Fumarate-FA (MULTIVITAMIN-PRENATAL) 27-0.8 MG TABS  tablet Take 1 tablet by mouth daily at 12 noon.   03/09/2021  ? Probiotic Product (PROBIOTIC-10 PO) Take by mouth.   Past Week  ? VITAMIN D PO Take by mouth.    Past Month  ? Misc. Devices (BREAST PUMP) MISC 1 Device by Does not apply route daily. 1 each 0   ? Turmeric 500 MG CAPS Take by mouth.     ? ? ?Review of Systems  ?Constitutional: Negative.   ?Respiratory: Negative.    ?Cardiovascular: Negative.   ?Gastrointestinal: Negative.   ?Genitourinary: Negative.   ?Musculoskeletal: Negative.   ?Neurological: Negative.   ? ?Physical Exam  ? ?Patient Vitals for the past 24 hrs: ? BP Temp Pulse Resp SpO2 Height Weight  ?03/10/21 2026 114/63 -- 76 -- -- -- --  ?03/10/21 1906 -- -- -- -- 100 % -- --  ?03/10/21 1901 -- -- -- -- 100 % -- --  ?03/10/21 1856 -- -- -- -- 100 % -- --  ?03/10/21 1550 102/72 98.4 ?F (36.9 ?C) 84 18 99 % _0  (1.676 m) 71.2 kg  ? ?Physical Exam ?Vitals and nursing note reviewed.  ?Constitutional:   ?   General: She is not in acute distress. ?Eyes:  ?   Extraocular Movements: Extraocular movements intact.  ?   Pupils: Pupils are equal, round, and reactive to light.  ?Cardiovascular:  ?   Rate and Rhythm: Normal rate.  ?Pulmonary:  ?   Effort: Pulmonary effort is normal.  ?Abdominal:  ?   Palpations: Abdomen is soft.  ?   Tenderness: There is no abdominal tenderness.  ?   Comments: gravid  ?Musculoskeletal:     ?   General: Normal range of motion.  ?   Cervical back: Normal range of motion.  ?Skin: ?   General: Skin is warm and dry.  ?Neurological:  ?   General: No focal deficit present.  ?   Mental Status: She is alert and oriented to person, place, and time.  ?Psychiatric:     ?   Mood and Affect: Mood normal.     ?   Behavior: Behavior normal.     ?   Thought Content: Thought content normal.     ?   Judgment: Judgment normal.  ? ?NST  ?FHR: 115 bpm, moderate variability, +15x15 accels, no decels ?Toco: quiet ? ? ? ?MAU Course  ?Procedures ?Extended  NST ?[place] ? ?MDM ?4 hours extended monitoring. NST  reassuring for gestational age. Toco quiet. Given normal/low amniotic fluid and traumatic fall, limited US was ordered. Preliminary [place] unchanged from [place] on 3/6. AFI unchanged, no evidence of abruption. Patient remains without contractions, leaking fluid, or bleeding and continues to endorse fetal movement.  ? ?Assessment and Plan  ?[redacted] weeks gestation of pregnancy ?Fall ? ?- Discharge home in stable condition ?- Strict return precautions reviewed at length with patient and significant other ?- Return to MAU sooner or as needed for worsening symptoms ?- Keep [place] appointment as scheduled on Monday 3/13 ? ? ?Renee Harder, CNM ?03/10/2021, 8:30 PM  ?

## 2021-03-10 NOTE — MAU Note (Signed)
.  Krista Lee is a 36 y.o. 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 Reports some discomfort t o her right hip but not very painful. Denies any vag bleeding reports some normal clear discharge. Good fetal movement felt since the fall.  ? ?Onset of complaint: 1hr ?Pain score: 1/10 ?There were no vitals filed for this visit.   ?FHT:157 ?Lab orders placed from triage:   ?  ?

## 2021-03-13 ENCOUNTER — Ambulatory Visit: Payer: 59 | Admitting: *Deleted

## 2021-03-13 ENCOUNTER — Ambulatory Visit: Payer: 59

## 2021-03-13 ENCOUNTER — Encounter: Payer: Self-pay | Admitting: Physical Therapy

## 2021-03-13 ENCOUNTER — Other Ambulatory Visit: Payer: Self-pay

## 2021-03-13 ENCOUNTER — Ambulatory Visit (HOSPITAL_BASED_OUTPATIENT_CLINIC_OR_DEPARTMENT_OTHER): Payer: 59

## 2021-03-13 ENCOUNTER — Ambulatory Visit: Payer: Self-pay

## 2021-03-13 VITALS — BP 104/55 | HR 77

## 2021-03-13 DIAGNOSIS — T1490XA Injury, unspecified, initial encounter: Secondary | ICD-10-CM

## 2021-03-13 DIAGNOSIS — Z3A33 33 weeks gestation of pregnancy: Secondary | ICD-10-CM

## 2021-03-13 DIAGNOSIS — O9A219 Injury, poisoning and certain other consequences of external causes complicating pregnancy, unspecified trimester: Secondary | ICD-10-CM | POA: Diagnosis not present

## 2021-03-13 DIAGNOSIS — O4443 Low lying placenta NOS or without hemorrhage, third trimester: Secondary | ICD-10-CM

## 2021-03-13 DIAGNOSIS — O283 Abnormal ultrasonic finding on antenatal screening of mother: Secondary | ICD-10-CM

## 2021-03-13 DIAGNOSIS — O4193X1 Disorder of amniotic fluid and membranes, unspecified, third trimester, fetus 1: Secondary | ICD-10-CM

## 2021-03-13 DIAGNOSIS — Z3480 Encounter for supervision of other normal pregnancy, unspecified trimester: Secondary | ICD-10-CM

## 2021-03-13 DIAGNOSIS — O09293 Supervision of pregnancy with other poor reproductive or obstetric history, third trimester: Secondary | ICD-10-CM

## 2021-03-20 ENCOUNTER — Ambulatory Visit (INDEPENDENT_AMBULATORY_CARE_PROVIDER_SITE_OTHER): Payer: 59 | Admitting: Clinical

## 2021-03-20 ENCOUNTER — Ambulatory Visit: Payer: 59 | Admitting: *Deleted

## 2021-03-20 ENCOUNTER — Encounter: Payer: 59 | Admitting: Advanced Practice Midwife

## 2021-03-20 ENCOUNTER — Ambulatory Visit (HOSPITAL_BASED_OUTPATIENT_CLINIC_OR_DEPARTMENT_OTHER): Payer: 59

## 2021-03-20 ENCOUNTER — Other Ambulatory Visit (HOSPITAL_COMMUNITY)
Admission: RE | Admit: 2021-03-20 | Discharge: 2021-03-20 | Disposition: A | Discharge status: Home / Self Care | Payer: 59 | Source: Ambulatory Visit | Attending: Advanced Practice Midwife | Admitting: Advanced Practice Midwife

## 2021-03-20 ENCOUNTER — Ambulatory Visit (INDEPENDENT_AMBULATORY_CARE_PROVIDER_SITE_OTHER): Payer: 59 | Admitting: Advanced Practice Midwife

## 2021-03-20 ENCOUNTER — Encounter: Payer: Self-pay | Admitting: *Deleted

## 2021-03-20 ENCOUNTER — Other Ambulatory Visit: Payer: Self-pay

## 2021-03-20 VITALS — BP 102/65 | HR 72 | Wt 153.0 lb

## 2021-03-20 VITALS — BP 110/65 | HR 61

## 2021-03-20 DIAGNOSIS — T1490XA Injury, unspecified, initial encounter: Secondary | ICD-10-CM

## 2021-03-20 DIAGNOSIS — O4103X0 Oligohydramnios, third trimester, not applicable or unspecified: Secondary | ICD-10-CM

## 2021-03-20 DIAGNOSIS — Z3480 Encounter for supervision of other normal pregnancy, unspecified trimester: Secondary | ICD-10-CM

## 2021-03-20 DIAGNOSIS — F411 Generalized anxiety disorder: Secondary | ICD-10-CM

## 2021-03-20 DIAGNOSIS — O26893 Other specified pregnancy related conditions, third trimester: Secondary | ICD-10-CM | POA: Insufficient documentation

## 2021-03-20 DIAGNOSIS — N898 Other specified noninflammatory disorders of vagina: Secondary | ICD-10-CM | POA: Insufficient documentation

## 2021-03-20 DIAGNOSIS — O09293 Supervision of pregnancy with other poor reproductive or obstetric history, third trimester: Secondary | ICD-10-CM | POA: Diagnosis not present

## 2021-03-20 DIAGNOSIS — Z3A34 34 weeks gestation of pregnancy: Secondary | ICD-10-CM

## 2021-03-20 DIAGNOSIS — O4193X1 Disorder of amniotic fluid and membranes, unspecified, third trimester, fetus 1: Secondary | ICD-10-CM

## 2021-03-20 DIAGNOSIS — O4443 Low lying placenta NOS or without hemorrhage, third trimester: Secondary | ICD-10-CM | POA: Diagnosis not present

## 2021-03-20 DIAGNOSIS — O9A213 Injury, poisoning and certain other consequences of external causes complicating pregnancy, third trimester: Secondary | ICD-10-CM | POA: Diagnosis not present

## 2021-03-20 LAB — OB RESULTS CONSOLE GC/CHLAMYDIA: Gonorrhea: NEGATIVE

## 2021-03-20 NOTE — Progress Notes (Addendum)
? ?  PRENATAL VISIT NOTE ? ?Subjective:  ?Krista Lee is a 36 y.o. G2P0010 at [redacted]w[redacted]d being seen today for ongoing prenatal care.  She is currently monitored for the following issues for this high-risk pregnancy and has Female infertility; Missed abortion; Supervision of other normal pregnancy, antepartum; and Ehlers-Danlos syndrome on their problem list. ? ?Patient reports  episode with thick white discharge yesterday after sitting at movie for long period .  Contractions: Not present. Vag. Bleeding: None.  Movement: Present. Denies leaking of fluid.  ? ?The following portions of the patient's history were reviewed and updated as appropriate: allergies, current medications, past family history, past medical history, past social history, past surgical history and problem list.  ? ?Objective:  ? ?Vitals:  ? 03/20/21 1618  ?BP: 102/65  ?Pulse: 72  ?Weight: 153 lb (69.4 kg)  ? ? ?Fetal Status: Fetal Heart Rate (bpm): 149 Fundal Height: 34 cm Movement: Present    ? ?General:  Alert, oriented and cooperative. Patient is in no acute distress.  ?Skin: Skin is warm and dry. No rash noted.   ?Cardiovascular: Normal heart rate noted  ?Respiratory: Normal respiratory effort, no problems with respiration noted  ?Abdomen: Soft, gravid, appropriate for gestational age.  Pain/Pressure: Absent     ?Pelvic: Cervical exam deferred        ?Extremities: Normal range of motion.  Edema: None  ?Mental Status: Normal mood and affect. Normal behavior. Normal judgment and thought content.  ? ?Assessment and Plan:  ?Pregnancy: G2P0010 at [redacted]w[redacted]d ? ?1. Supervision of other normal pregnancy, antepartum ?--Anticipatory guidance about next visits/weeks of pregnancy given.  ? ? ?2. Oligohydramnios in third trimester, single or unspecified fetus ?--AFI low normal, stable on [place] today, continue weekly testing ?--IOL at 37 weeks (or greater if AFI stable) per MFM. Discussed IOL again with pt today, questions answered.  Pt is concerned about 3 day  induction, may desire cesarean over 3 day induction.  ? ?3. [redacted] weeks gestation of pregnancy ? ? ?4. Vaginal discharge during pregnancy in third trimester ?--No itching/burning. Discussed options, pt prefers testing for yeast so swab collected.  ?- Cervicovaginal ancillary only( [place])  ? ?Preterm labor symptoms and general obstetric precautions including but not limited to vaginal bleeding, contractions, leaking of fluid and fetal movement were reviewed in detail with the patient. ?Please refer to After Visit Summary for other counseling recommendations.  ? ?No follow-ups on file. ? ?Future Appointments  ?Date Time Provider [place]  ?03/27/2021  3:15 PM [facility] NURSE [facility]  ?03/27/2021  3:30 PM [facility] US3 [facility] [place]  ?04/03/2021 10:00 AM Mendelson, Dorinda Hill, PhD [facility] None  ?04/03/2021  1:30 PM Leftwich-Kirby, Kathie Dike, CNM [facility] None  ?04/03/2021  3:45 PM [facility] NURSE [facility]  ?04/03/2021  4:00 PM [facility] US1 [facility]  ?04/10/2021  1:30 PM Constant, Vickii Chafe, MD [facility] None  ?04/17/2021 10:00 AM Mendelson, Dorinda Hill, PhD [facility] None  ?04/17/2021  1:30 PM Constant, Vickii Chafe, MD [facility] None  ?04/24/2021  1:30 PM Luvenia Redden, PA-C [facility] None  ?05/01/2021 10:00 AM Mendelson, Dorinda Hill, PhD [facility] None  ?05/15/2021 10:00 AM Mendelson, Dorinda Hill, PhD [facility] None  ? ? ?Fatima Blank, CNM  ?

## 2021-03-20 NOTE — Progress Notes (Addendum)
Diagnosis: F41.9 ?Time: 10:04am-10:59am ?CPT Code: [id number], SEEN ? ?Krista Lee was seen remotely using secure video conferencing. She was in her home in [place] and the therapist was in her office at the time of the appointment. She shared that she had learned that delivery would be induced between 36-37 weeks due to low amniotic fluid. Session began by processing this. Therapist encouraged her to advocate for her needs and preferences. She also shared concerns about maintaining boundaries with family members after the baby is born, and therapist provided an opportunity to process. She is scheduled to be seen again in one month. ? ? ?Treatment Plan ?Client Abilities/Strengths  ?Krista Lee presented as insightful and motivated, with clear goals for therapy.  ?Client Treatment Preferences  ?Krista Lee would like to begin with monthly appointments, and increase frequency once she has hit her insurance deductible.  ?Client Statement of Needs  ?Krista Lee is seeking CBT to address anxiety and relationship challenges.  ?Treatment Level  ?Monthly  ?Symptoms  ?Anxiety : vivid, persistent thoughts of worst case scenarios that are difficult to disengage from (Status: maintained). Relational Challenges: Difficulty with mutual triggering of uncomfortable emotions in marriage (Status: maintained).  ?Problems Addressed  ?New Description, New Description  ?Goals ?1. Difficulty in relationship with husband ?Objective ?Development of strategies to regulate emotional response to husband's stress ?Target Date: 2022-03-03 Frequency: Biweekly  ?Progress: 0 Modality: individual  ?Related Interventions ?Therapist will work with Krista Lee to develop communication strategies by helping her to consider how she might respond to different situations and offering opportunities to practice, such as through written exercises and role plays ?Therapist will work with Krista Lee to develop her communication strategies, including labeling and expressing her emotions and  using "I" statements ?2. Persistent anxiety triggered by thoughts of worse case scenarios that feel real ?Objective ?Decrease in frequency and severity of anxiety, increase in overall well bein ?Target Date: 2022-03-03 Frequency: Biweekly  ?Progress: 0 Modality: individual  ?Related Interventions ?Therapist will provide referrals for additional resources as appropriate ?Therapist will provide Krista Lee with strategies to regulate her emotions, including meditation, breathing exercises, mindfulness, and self-care. ?Therapist will help Krista Lee to identify and disengage from maladaptive thought patterns using CBT-based strategies ?Therapist will provide Krista Lee with opportunities to process her experiences in session ?Diagnosis ?Axis none 300.00 (Anxiety state, unspecified) - Open - [Signifier: n/a]    ?Conditions For Discharge ?Achievement of treatment goals and objectives ?Therapist Signature ___________________________ ?Patient Signature ___________________________ ? ? ? ?[name], PhD ? ? ? ? ? ? ? ? ? ? ? ? ? ? ?[name], PhD ? ? ? ? ? ? ? ? ? ? ? ? ? ? ?[name], PhD ?

## 2021-03-22 LAB — CERVICOVAGINAL ANCILLARY ONLY
Bacterial Vaginitis (gardnerella): NEGATIVE
Candida Glabrata: NEGATIVE
Candida Vaginitis: NEGATIVE
Chlamydia: NEGATIVE
Comment: NEGATIVE
Comment: NEGATIVE
Comment: NEGATIVE
Comment: NEGATIVE
Comment: NEGATIVE
Comment: NORMAL
Neisseria Gonorrhea: NEGATIVE
Trichomonas: NEGATIVE

## 2021-03-27 ENCOUNTER — Ambulatory Visit: Payer: 59 | Admitting: *Deleted

## 2021-03-27 ENCOUNTER — Ambulatory Visit: Payer: 59 | Attending: Obstetrics and Gynecology

## 2021-03-27 ENCOUNTER — Encounter: Payer: Self-pay | Admitting: *Deleted

## 2021-03-27 ENCOUNTER — Other Ambulatory Visit: Payer: Self-pay

## 2021-03-27 VITALS — BP 123/71 | HR 85

## 2021-03-27 DIAGNOSIS — O4103X0 Oligohydramnios, third trimester, not applicable or unspecified: Secondary | ICD-10-CM | POA: Insufficient documentation

## 2021-03-27 DIAGNOSIS — O4443 Low lying placenta NOS or without hemorrhage, third trimester: Secondary | ICD-10-CM | POA: Diagnosis not present

## 2021-03-27 DIAGNOSIS — O09893 Supervision of other high risk pregnancies, third trimester: Secondary | ICD-10-CM | POA: Insufficient documentation

## 2021-03-27 DIAGNOSIS — Z3A35 35 weeks gestation of pregnancy: Secondary | ICD-10-CM | POA: Insufficient documentation

## 2021-03-27 DIAGNOSIS — Z3480 Encounter for supervision of other normal pregnancy, unspecified trimester: Secondary | ICD-10-CM | POA: Insufficient documentation

## 2021-03-27 DIAGNOSIS — O4193X1 Disorder of amniotic fluid and membranes, unspecified, third trimester, fetus 1: Secondary | ICD-10-CM | POA: Insufficient documentation

## 2021-03-27 DIAGNOSIS — O288 Other abnormal findings on antenatal screening of mother: Secondary | ICD-10-CM

## 2021-03-27 DIAGNOSIS — T1490XD Injury, unspecified, subsequent encounter: Secondary | ICD-10-CM

## 2021-03-27 DIAGNOSIS — O9A213 Injury, poisoning and certain other consequences of external causes complicating pregnancy, third trimester: Secondary | ICD-10-CM

## 2021-03-27 DIAGNOSIS — O09293 Supervision of pregnancy with other poor reproductive or obstetric history, third trimester: Secondary | ICD-10-CM | POA: Diagnosis not present

## 2021-04-03 ENCOUNTER — Other Ambulatory Visit: Payer: Self-pay | Admitting: Obstetrics

## 2021-04-03 ENCOUNTER — Ambulatory Visit: Payer: 59 | Admitting: *Deleted

## 2021-04-03 ENCOUNTER — Encounter: Payer: Self-pay | Admitting: Advanced Practice Midwife

## 2021-04-03 ENCOUNTER — Ambulatory Visit (INDEPENDENT_AMBULATORY_CARE_PROVIDER_SITE_OTHER): Payer: 59 | Admitting: Advanced Practice Midwife

## 2021-04-03 ENCOUNTER — Ambulatory Visit (INDEPENDENT_AMBULATORY_CARE_PROVIDER_SITE_OTHER): Payer: 59 | Admitting: Clinical

## 2021-04-03 ENCOUNTER — Ambulatory Visit: Payer: 59 | Attending: Obstetrics

## 2021-04-03 VITALS — BP 116/71 | HR 79

## 2021-04-03 VITALS — BP 115/69 | HR 85 | Wt 158.4 lb

## 2021-04-03 DIAGNOSIS — O09293 Supervision of pregnancy with other poor reproductive or obstetric history, third trimester: Secondary | ICD-10-CM | POA: Insufficient documentation

## 2021-04-03 DIAGNOSIS — F411 Generalized anxiety disorder: Secondary | ICD-10-CM

## 2021-04-03 DIAGNOSIS — Z3480 Encounter for supervision of other normal pregnancy, unspecified trimester: Secondary | ICD-10-CM | POA: Insufficient documentation

## 2021-04-03 DIAGNOSIS — Z3A36 36 weeks gestation of pregnancy: Secondary | ICD-10-CM | POA: Insufficient documentation

## 2021-04-03 DIAGNOSIS — O4103X0 Oligohydramnios, third trimester, not applicable or unspecified: Secondary | ICD-10-CM | POA: Diagnosis not present

## 2021-04-03 DIAGNOSIS — O4193X1 Disorder of amniotic fluid and membranes, unspecified, third trimester, fetus 1: Secondary | ICD-10-CM | POA: Insufficient documentation

## 2021-04-03 NOTE — Progress Notes (Signed)
? ?PRENATAL VISIT NOTE ? ?Subjective:  ?Krista Lee is a 36 y.o. G2P0010 at [redacted]w[redacted]d being seen today for ongoing prenatal care.  She is currently monitored for the following issues for this low-risk pregnancy and has Female infertility; Missed abortion; Supervision of other normal pregnancy, antepartum; and Ehlers-Danlos syndrome on their problem list. ? ?Patient reports occasional contractions.  Contractions: Not present. Vag. Bleeding: None.  Movement: Present. Denies leaking of fluid.  ? ?The following portions of the patient's history were reviewed and updated as appropriate: allergies, current medications, past family history, past medical history, past social history, past surgical history and problem list.  ? ?Objective:  ? ?Vitals:  ? 04/03/21 1333  ?BP: 115/69  ?Pulse: 85  ?Weight: 158 lb 6.4 oz (71.8 kg)  ? ? ?Fetal Status: Fetal Heart Rate (bpm): 144.6 Fundal Height: 35 cm Movement: Present  Presentation: Vertex ? ?General:  Alert, oriented and cooperative. Patient is in no acute distress.  ?Skin: Skin is warm and dry. No rash noted.   ?Cardiovascular: Normal heart rate noted  ?Respiratory: Normal respiratory effort, no problems with respiration noted  ?Abdomen: Soft, gravid, appropriate for gestational age.  Pain/Pressure: Absent     ?Pelvic: Cervical exam performed in the presence of a chaperone Dilation: Fingertip Effacement (%): 50 Station: -2  ?Extremities: Normal range of motion.  Edema: Trace  ?Mental Status: Normal mood and affect. Normal behavior. Normal judgment and thought content.  ? ?Assessment and Plan:  ?Pregnancy: G2P0010 at [redacted]w[redacted]d ?1. Supervision of other normal pregnancy, antepartum ?--Anticipatory guidance about next visits/weeks of pregnancy given.  ?--Cervix 0.5/50/-2, anterior, soft on exam today ?--Pt has BPP with MFM today, AFI dropped last week so will send for IOL if continues to drop, pt prepared for IOL but hopes to get to at least 38 weeks ?--Orders placed for IOL today ? ?2.  Oligohydramnios in third trimester, single or unspecified fetus ?--AFI 5.7 last week, BPP today ? ?3. [redacted] weeks gestation of pregnancy ? ? ?Term labor symptoms and general obstetric precautions including but not limited to vaginal bleeding, contractions, leaking of fluid and fetal movement were reviewed in detail with the patient. ?Please refer to After Visit Summary for other counseling recommendations.  ? ?Return in about 1 week (around 04/10/2021) for LOB, Any provider. ? ?Future Appointments  ?Date Time Provider Department Center  ?04/03/2021  3:30 PM [facility] NURSE [facility]  ?04/03/2021  3:45 PM [facility] US1 [facility]  ?04/10/2021  1:30 PM Constant, Gigi Gin, MD [facility] None  ?04/17/2021 10:00 AM Mendelson, Hulda Humphrey, PhD [facility] None  ?04/17/2021  1:30 PM Constant, Gigi Gin, MD [facility] None  ?04/24/2021  1:30 PM Marny Lowenstein, PA-C [facility] None  ?05/01/2021 10:00 AM Mendelson, Hulda Humphrey, PhD [facility] None  ?05/15/2021 10:00 AM Mendelson, Hulda Humphrey, PhD [facility] None  ?06/12/2021 10:00 AM Mendelson, Hulda Humphrey, PhD [facility] None  ?06/26/2021 10:00 AM Mendelson, Hulda Humphrey, PhD [facility] None  ?07/10/2021 10:00 AM Mendelson, Hulda Humphrey, PhD [facility] None  ?07/24/2021 10:00 AM Mendelson, Hulda Humphrey, PhD [facility] None  ?08/07/2021 10:00 AM Mendelson, Hulda Humphrey, PhD [facility] None  ?08/21/2021 10:00 AM Mendelson, Hulda Humphrey, PhD [facility] None  ?09/18/2021 10:00 AM Mendelson, Hulda Humphrey, PhD [facility] None  ?10/02/2021 10:00 AM Mendelson, Hulda Humphrey, PhD [facility] None  ?10/16/2021 10:00 AM Mendelson, Hulda Humphrey, PhD [facility] None  ?10/30/2021 10:00 AM Mendelson, Hulda Humphrey, PhD [facility] None  ?11/13/2021 10:00 AM Mendelson, Hulda Humphrey, PhD [facility] None  ?11/27/2021 10:00 AM Mendelson, Hulda Humphrey, PhD [facility]  None  ?12/11/2021 10:00 AM Mendelson, Hulda Humphrey, PhD [facility] None  ? ? ?Sharen Counter, CNM  ?

## 2021-04-03 NOTE — Patient Instructions (Signed)
Things to Try After 37 weeks to Encourage Labor/Get Ready for Labor:    Try the Miles Circuit at www.milescircuit.com daily to improve baby's position and encourage the onset of labor.  Walk a little and rest a little every day.  Change positions often.  Cervical Ripening: May try one or both Red Raspberry Leaf capsules or tea:  two 300mg or 400mg tablets with each meal, 2-3 times a day, or 1-3 cups of tea daily  Potential Side Effects Of Raspberry Leaf:  Most women do not experience any side effects from drinking raspberry leaf tea. However, nausea and loose stools are possible   Evening Primrose Oil capsules: take 1 capsule by mouth and place one capsule in the vagina every night.    Some of the potential side effects:  Upset stomach  Loose stools or diarrhea  Headaches  Nausea  Sex can also help the cervix ripen and encourage labor onset.    Labor Precautions Reasons to come to MAU at [facility]:  1.  Contractions are  5 minutes apart or less, each last 1 minute, these have been going on for 1-2 hours, and you cannot walk or talk during them 2.  You have a large gush of fluid, or a trickle of fluid that will not stop and you have to wear a pad 3.  You have bleeding that is bright red, heavier than spotting--like menstrual bleeding (spotting can be normal in early labor or after a check of your cervix) 4.  You do not feel the baby moving like he/she normally does  

## 2021-04-03 NOTE — Progress Notes (Signed)
Diagnosis: F41.9 ?Time: 10:03am-10:53am ?CPT Code: [id number] ? ?Krista Lee was seen remotely using secure video conferencing. She was in her home in [place] and the therapist was in her office at the time of the appointment. She shared that she had a doctor's appointment that afternoon in which she would learn if she could wait for a few more weeks or if she would have to be induced before the end of the week. Session focused on processing her feelings about the impending birth. She is scheduled to be seen again in two weeks. ? ?Treatment Plan ?Client Abilities/Strengths  ?Early presented as insightful and motivated, with clear goals for therapy.  ?Client Treatment Preferences  ?Krista Lee would like to begin with monthly appointments, and increase frequency once she has hit her insurance deductible.  ?Client Statement of Needs  ?Krista Lee is seeking CBT to address anxiety and relationship challenges.  ?Treatment Level  ?Monthly  ?Symptoms  ?Anxiety : vivid, persistent thoughts of worst case scenarios that are difficult to disengage from (Status: maintained). Relational Challenges: Difficulty with mutual triggering of uncomfortable emotions in marriage (Status: maintained).  ?Problems Addressed  ?New Description, New Description  ?Goals ?1. Difficulty in relationship with husband ?Objective ?Development of strategies to regulate emotional response to husband's stress ?Target Date: 2022-03-03 Frequency: Biweekly  ?Progress: 0 Modality: individual  ?Related Interventions ?Therapist will work with Krista Lee to develop communication strategies by helping her to consider how she might respond to different situations and offering opportunities to practice, such as through written exercises and role plays ?Therapist will work with Krista Lee to develop her communication strategies, including labeling and expressing her emotions and using "I" statements ?2. Persistent anxiety triggered by thoughts of worse case scenarios that feel  real ?Objective ?Decrease in frequency and severity of anxiety, increase in overall well bein ?Target Date: 2022-03-03 Frequency: Biweekly  ?Progress: 0 Modality: individual  ?Related Interventions ?Therapist will provide referrals for additional resources as appropriate ?Therapist will provide Krista Lee with strategies to regulate her emotions, including meditation, breathing exercises, mindfulness, and self-care. ?Therapist will help Krista Lee to identify and disengage from maladaptive thought patterns using CBT-based strategies ?Therapist will provide Krista Lee with opportunities to process her experiences in session ?Diagnosis ?Axis none 300.00 (Anxiety state, unspecified) - Open - [Signifier: n/a]    ?Conditions For Discharge ?Achievement of treatment goals and objectives ?Therapist Signature ___________________________ ?Patient Signature ___________________________ ? ? ? ?[name], PhD ? ? ? ? ? ? ? ? ? ? ? ? ? ? ?[name], PhD ? ? ? ? ? ? ? ? ? ? ? ? ? ? ?[name], PhD ? ? ? ? ? ? ? ? ? ? ? ? ? ? ?[name], PhD ?

## 2021-04-06 ENCOUNTER — Inpatient Hospital Stay (HOSPITAL_COMMUNITY)
Admission: AD | Admit: 2021-04-06 | Discharge: 2021-04-06 | Disposition: A | Discharge status: Home / Self Care | Payer: 59 | Attending: Family Medicine | Admitting: Family Medicine

## 2021-04-06 ENCOUNTER — Encounter (HOSPITAL_COMMUNITY): Payer: Self-pay | Admitting: Family Medicine

## 2021-04-06 ENCOUNTER — Other Ambulatory Visit: Payer: Self-pay

## 2021-04-06 ENCOUNTER — Telehealth: Payer: Self-pay

## 2021-04-06 DIAGNOSIS — G4489 Other headache syndrome: Secondary | ICD-10-CM | POA: Diagnosis not present

## 2021-04-06 DIAGNOSIS — O99891 Other specified diseases and conditions complicating pregnancy: Secondary | ICD-10-CM | POA: Diagnosis not present

## 2021-04-06 DIAGNOSIS — Z3480 Encounter for supervision of other normal pregnancy, unspecified trimester: Secondary | ICD-10-CM | POA: Diagnosis not present

## 2021-04-06 DIAGNOSIS — Z3689 Encounter for other specified antenatal screening: Secondary | ICD-10-CM

## 2021-04-06 DIAGNOSIS — R12 Heartburn: Secondary | ICD-10-CM | POA: Diagnosis not present

## 2021-04-06 DIAGNOSIS — O99283 Endocrine, nutritional and metabolic diseases complicating pregnancy, third trimester: Secondary | ICD-10-CM | POA: Insufficient documentation

## 2021-04-06 DIAGNOSIS — E876 Hypokalemia: Secondary | ICD-10-CM | POA: Insufficient documentation

## 2021-04-06 DIAGNOSIS — H539 Unspecified visual disturbance: Secondary | ICD-10-CM

## 2021-04-06 DIAGNOSIS — R519 Headache, unspecified: Secondary | ICD-10-CM

## 2021-04-06 DIAGNOSIS — Z3A36 36 weeks gestation of pregnancy: Secondary | ICD-10-CM

## 2021-04-06 LAB — PROTEIN / CREATININE RATIO, URINE
Creatinine, Urine: 10.11 mg/dL
Total Protein, Urine: <6 mg/dL

## 2021-04-06 LAB — COMPREHENSIVE METABOLIC PANEL
ALT: 17 U/L (ref 0–44)
AST: 23 U/L (ref 15–41)
Albumin: 2.5 g/dL — ABNORMAL LOW (ref 3.5–5.0)
Alkaline Phosphatase: 83 U/L (ref 38–126)
Anion gap: 7 (ref 5–15)
BUN: 5 mg/dL — ABNORMAL LOW (ref 6–20)
CO2: 21 mmol/L — ABNORMAL LOW (ref 22–32)
Calcium: 8.8 mg/dL — ABNORMAL LOW (ref 8.9–10.3)
Chloride: 108 mmol/L (ref 98–111)
Creatinine, Ser: 0.49 mg/dL (ref 0.44–1.00)
GFR, Estimated: >60 mL/min (ref >60)
Glucose, Bld: 106 mg/dL — ABNORMAL HIGH (ref 70–99)
Potassium: 3.3 mmol/L — ABNORMAL LOW (ref 3.5–5.1)
Sodium: 136 mmol/L (ref 135–145)
Total Bilirubin: 0.3 mg/dL (ref 0.3–1.2)
Total Protein: 5.5 g/dL — ABNORMAL LOW (ref 6.5–8.1)

## 2021-04-06 LAB — URINALYSIS, ROUTINE W REFLEX MICROSCOPIC
Bilirubin Urine: NEGATIVE
Glucose, UA: NEGATIVE mg/dL
Hgb urine dipstick: NEGATIVE
Ketones, ur: NEGATIVE mg/dL
Leukocytes,Ua: NEGATIVE
Nitrite: NEGATIVE
Protein, ur: NEGATIVE mg/dL
Specific Gravity, Urine: 1.002 — ABNORMAL LOW (ref 1.005–1.030)
pH: 7 (ref 5.0–8.0)

## 2021-04-06 LAB — CBC
HCT: 35.7 % — ABNORMAL LOW (ref 36.0–46.0)
Hemoglobin: 12 g/dL (ref 12.0–15.0)
MCH: 29.6 pg (ref 26.0–34.0)
MCHC: 33.6 g/dL (ref 30.0–36.0)
MCV: 88.1 fL (ref 80.0–100.0)
Platelets: 251 10*3/uL (ref 150–400)
RBC: 4.05 MIL/uL (ref 3.87–5.11)
RDW: 13.8 % (ref 11.5–15.5)
WBC: 8.2 10*3/uL (ref 4.0–10.5)
nRBC: 0 % (ref 0.0–0.2)

## 2021-04-06 MED ORDER — POTASSIUM CHLORIDE CRYS ER 10 MEQ PO TBCR: 10.0000 meq | EXTENDED_RELEASE_TABLET | Freq: Every day | ORAL | 0 refills | Status: DC

## 2021-04-06 MED ORDER — FAMOTIDINE 20 MG PO TABS
20.0000 mg | ORAL_TABLET | Freq: Once | ORAL | Status: AC
Start: 2021-04-06 — End: 2021-04-06
  Administered 2021-04-06: 20 mg via ORAL
  Filled 2021-04-06: qty 1

## 2021-04-06 NOTE — Progress Notes (Signed)
Written and verbal d/c instructions given and understanding voiced. 

## 2021-04-06 NOTE — Discharge Instructions (Addendum)
You came to the MAU because you had two episodes of vision changes and a mild headache. Your neuro exam was normal and labs were normal. Your blood pressure was also normal which is reassuring. We watched your baby on the monitor which was reassuring. Please seek medical care if your headaches get worse and don't improve with medications, you have vision changes with nausea and vomiting, and if you have blood pressures that are above 140s/90s on multiple reads.  ? ?Please also seek medical care if you have vaginal bleeding, leaking of fluid, or feel like your baby is not moving as much. ? ?

## 2021-04-06 NOTE — MAU Provider Note (Signed)
?History  ?  ? ?CSN: [id number] ? ?Arrival date and time: 04/06/21 1759 ? ? None  ?  ? ?Chief Complaint  ?Patient presents with  ? Headache  ? Blurred Vision  ? ?HPI ?36 yo F G2P0010 at 74w5dwho presents with two episodes of vision changes and mild headache .  ? ?#Seeing shapes -intermittent ?#Headache- mild ?Two separate episodes ?First episode 15 min-20 min (~2PM) ?Cresecent shape ?Improved when got in shower ?2nd episode - 4:40-5PM ?Pill shape to two cresent shapes back to back ?It then got a little bigger ?BP has been normal throughout (checked at home) ?Headache after second episode ?Very mild ?Has had intermittent headaches without vision changes  on and off for 1 month ?No numbness or tingling ?No falls since last MAU visit  on 3/10 (fell on buttock at work and had prolonged monitoring) ?Normal eating and drinking ?No nausea or vomiting ?No LOF, no VB, +FM ?No obstetric concerns today ?Currently not having vision changes or headache ? ? ?OB History   ? ? Gravida  ?2  ? Para  ?0  ? Term  ?0  ? Preterm  ?0  ? AB  ?1  ? Living  ?0  ?  ? ? SAB  ?1  ? IAB  ?0  ? Ectopic  ?0  ? Multiple  ?0  ? Live Births  ?0  ?   ?  ?  ? ? ?Past Medical History:  ?Diagnosis Date  ? ADD (attention deficit disorder)   ? Allergy   ? no meds  ? Anemia   ? as adolescent  ? Anxiety   ? Depression   ? GERD (gastroesophageal reflux disease)   ? doesn't take any meds for this, diet controlled  ? Headache(784.0)   ? Heart murmur   ? as a child - no problems  ? History of bronchitis   ? as a teenager, no problems as adult  ? History of migraine   ? can't remember when the last one was  ? Insomnia   ? no meds  ? Panic attacks   ? Symptomatic cholelithiasis 09/25/2011  ? UTI (urinary tract infection)   ? ? ?Past Surgical History:  ?Procedure Laterality Date  ? CHOLECYSTECTOMY  10/22/2011  ? Procedure: LAPAROSCOPIC CHOLECYSTECTOMY;  Surgeon: DHarl Bowie MD;  [facility]  Service: General;  Laterality: N/A;  ? [place] N/A 12/07/2019  ? Procedure: DILATATION AND EVACUATION;  Surgeon: CMora Bellman MD;  [facility]  Service: Gynecology;  Laterality: N/A;  NEEDS ULTRASOUND GUIDANCE ?ANORA KIT SENT   ? HYSTEROSCOPY WITH RESECTOSCOPE N/A 04/28/2013  ? Procedure:  HYSTEROSCOPY ;  Surgeon: MPrincess Bruins MD;  [facility];  Service: Gynecology;  Laterality: N/A;  ? LAPAROSCOPY N/A 04/28/2013  ? Procedure: LAPAROSCOPY DIAGNOSTIC;  Surgeon: MPrincess Bruins MD;  [facility];  Service: Gynecology;  Laterality: N/A;  2 hrs. total  ? OPERATIVE ULTRASOUND N/A 12/07/2019  ? Procedure: OPERATIVE ULTRASOUND;  Surgeon: CMora Bellman MD;  [facility]  Service: Gynecology;  Laterality: N/A;  ? WISDOM TOOTH EXTRACTION  [address]yrago  ? ? ?Family History  ?Problem Relation Age of Onset  ? Hypertension Mother   ? Diverticulitis Mother   ? Hyperlipidemia Mother   ? Arthritis Father   ? Rheum arthritis Paternal Grandmother   ? ? ?Social History  ? ?Tobacco Use  ? Smoking status: Former  ?  Packs/day: 0.50  ?  Years: 8.00  ?  Pack years: 4.00  ?  Types: Cigarettes  ?  Quit date: 08/2019  ?  Years since quitting: 1.6  ? Smokeless tobacco: Former  ?  Quit date: 01/02/2011  ?Vaping Use  ? Vaping Use: Never used  ?Substance Use Topics  ? Alcohol use: Not Currently  ?  Alcohol/week: 14.0 standard drinks  ?  Types: 14 Standard drinks or equivalent per week  ?  Comment: not since confirmed pregnancy  ? Drug use: No  ? ? ?Allergies:  ?Allergies  ?Allergen Reactions  ? Citrus Nausea And Vomiting  ?  Oranges  ? Ibuprofen Other (See Comments)  ?  Childhood reaction of high fever  ? Percocet [Oxycodone-Acetaminophen] Nausea Only  ?  Pt can take regular tylenol but in combination with codeine products is makes her very sick to her stomach.  ? ? ?Medications Prior to Admission  ?Medication Sig Dispense Refill Last Dose  ? Calcium Carbonate Antacid (TUMS PO) Take by mouth.   04/06/2021  ? Prenatal Vit-Fe  Fumarate-FA (MULTIVITAMIN-PRENATAL) 27-0.8 MG TABS tablet Take 1 tablet by mouth daily at 12 noon.   04/06/2021  ? Probiotic Product (PROBIOTIC-10 PO) Take by mouth.   04/06/2021  ? VITAMIN D PO Take by mouth.   04/06/2021  ? acetaminophen (TYLENOL) 500 MG tablet Take 1,000 mg by mouth every 6 (six) hours as needed for moderate pain.     ? cetirizine (ZYRTEC) 10 MG tablet Take 10 mg by mouth daily.     ? famotidine (PEPCID) 10 MG tablet Take 10 mg by mouth 2 (two) times daily.     ? Misc. Devices (BREAST PUMP) MISC 1 Device by Does not apply route daily. (Patient not taking: Reported on 04/03/2021) 1 each 0   ? ? ?Review of Systems  ?Constitutional:  Negative for fatigue and fever.  ?Eyes:  Negative for photophobia, pain, redness, itching and visual disturbance.  ?     Currently not having any symptoms  ?Respiratory:  Negative for shortness of breath.   ?Cardiovascular:  Negative for chest pain.  ?Gastrointestinal:  Negative for abdominal pain, nausea and vomiting.  ?Musculoskeletal:  Negative for back pain, myalgias, neck pain and neck stiffness.  ?Neurological:  Negative for dizziness, seizures, syncope, speech difficulty, weakness, light-headedness, numbness and headaches.  ?Psychiatric/Behavioral:  Negative for agitation.   ?Physical Exam  ? ?Blood pressure 111/69, pulse 80, temperature 98.5 ?F (36.9 ?C), temperature source Oral, resp. rate 17, height '5\' 6"'  (1.676 m), weight 71.7 kg, last menstrual period 07/23/2020, SpO2 100 %, unknown if currently breastfeeding. ? ?Physical Exam ?Vitals reviewed.  ?HENT:  ?   Head: Normocephalic.  ?   Mouth/Throat:  ?   Mouth: Mucous membranes are moist.  ?Eyes:  ?   General: No visual field deficit. ?   Extraocular Movements:  ?   Right eye: Normal extraocular motion and no nystagmus.  ?   Left eye: Normal extraocular motion and no nystagmus.  ?Cardiovascular:  ?   Rate and Rhythm: Normal rate.  ?   Heart sounds: Normal heart sounds.  ?Pulmonary:  ?   Effort: Pulmonary effort is  normal.  ?Musculoskeletal:  ?   Cervical back: No rigidity.  ?Skin: ?   General: Skin is warm.  ?   Capillary Refill: Capillary refill takes less than 2 seconds.  ?Neurological:  ?   Mental Status: She is alert and oriented to person, place, and time.  ?   Cranial Nerves: No cranial  nerve deficit, dysarthria or facial asymmetry.  ?   Sensory: No sensory deficit.  ?   Motor: No weakness.  ?Psychiatric:     ?   Mood and Affect: Mood normal.  ? ? ?MAU Course  ?Procedures ?NST reactive  ?Baseline 125-130, accels (more than 3 15x15), no decels ?No contractions ? ?MDM ?Headache and vision changes- transient ?Patient with two episodes of vision changes and some intermittent mild headaches that all improved spontaneously. Normal blood pressures. CN II-XII intact. Normal strength and sensation of both upper and lower extremities. Currently asymptomatic so low suspicion for preE however given symptoms will check preE labs given neurological symptoms at late preterm gestation. Patient also with possible Ehlers Danlos syndrome (hypermobility type) but has not had formal testing for diagnosis. No prior vision symptoms. ? ?7:42 PM ?Normal CBC and LFTs ? ?8:54 PM  ?Normal urine protein:creatinine. Patient continues to feel well and has had no headache or vision symptoms throughout her whole MAU visit. Safe to discharge home with return precautions. ? ?Assessment and Plan  ? ?Transient headache and vision changes (seeing shapes)- currently asymptomatic ?Normal preE labs ?Possible positional or related to lighting as patient felt it while doing curb walking or while watching video on phone ?Discussed return precautions in detail including vision symptoms that don't resolve or worsening headache that doesn't resolve with medications and also BP precautions. Also discussed OB return precautions ?Routine OB follow up (has appt on 4/10) ? ?2. Hypokalemia ?On CMP K 3.3. 4 day course of oral potassium sent to patient's pharmacy ? ?3.  Heartburn ?Treated with pepcid ? ?Renard Matter ?04/06/2021, 6:21 PM  ?

## 2021-04-06 NOTE — Telephone Encounter (Signed)
Patient states that she has been having blurry vision and having visual disturbances. She states that the sx started today. Also complains of intermittent headaches.Patient states that her BP was normal earlier and is now 112/69. Patient advised to be evaluated at MAU. ?

## 2021-04-06 NOTE — MAU Note (Addendum)
...  Krista Lee is a 36 y.o. at [redacted]w[redacted]d here in MAU reporting: Blurred vision that occurred twice today. She states the first time it occurred she was watching tik tok on her phone and her left eye had a field that was shaped like a half moon that was blurry. She states later on she was "curb walking" on a yoga ball and her right eye began to experience the same fields of blurry vision but this time there were "two half moons." She states she began feeling dizzy and states those symptoms ceased around 20 minutes later. She is also endorsing a HA that has been off and on for one month now but is now "less than a 1" on the pain scale. +FM. No VB or LOF. ? ?Pain score: Slight HA. "Lower than a one." ? ?FHT: 155 doppler ? ?

## 2021-04-07 LAB — CULTURE, BETA STREP (GROUP B ONLY): Strep Gp B Culture: NEGATIVE

## 2021-04-10 ENCOUNTER — Ambulatory Visit (HOSPITAL_BASED_OUTPATIENT_CLINIC_OR_DEPARTMENT_OTHER): Payer: 59

## 2021-04-10 ENCOUNTER — Encounter: Payer: Self-pay | Admitting: Obstetrics and Gynecology

## 2021-04-10 ENCOUNTER — Ambulatory Visit (INDEPENDENT_AMBULATORY_CARE_PROVIDER_SITE_OTHER): Payer: 59 | Admitting: Obstetrics and Gynecology

## 2021-04-10 ENCOUNTER — Ambulatory Visit (HOSPITAL_BASED_OUTPATIENT_CLINIC_OR_DEPARTMENT_OTHER): Payer: 59 | Admitting: *Deleted

## 2021-04-10 ENCOUNTER — Ambulatory Visit: Payer: 59 | Attending: Obstetrics and Gynecology | Admitting: *Deleted

## 2021-04-10 ENCOUNTER — Encounter: Payer: Self-pay | Admitting: *Deleted

## 2021-04-10 ENCOUNTER — Other Ambulatory Visit: Payer: Self-pay | Admitting: Obstetrics

## 2021-04-10 VITALS — BP 112/67 | HR 68

## 2021-04-10 VITALS — BP 105/70 | HR 98 | Wt 161.0 lb

## 2021-04-10 DIAGNOSIS — Z3A37 37 weeks gestation of pregnancy: Secondary | ICD-10-CM | POA: Insufficient documentation

## 2021-04-10 DIAGNOSIS — O09293 Supervision of pregnancy with other poor reproductive or obstetric history, third trimester: Secondary | ICD-10-CM | POA: Diagnosis not present

## 2021-04-10 DIAGNOSIS — O4443 Low lying placenta NOS or without hemorrhage, third trimester: Secondary | ICD-10-CM

## 2021-04-10 DIAGNOSIS — O4103X1 Oligohydramnios, third trimester, fetus 1: Secondary | ICD-10-CM

## 2021-04-10 DIAGNOSIS — O4103X0 Oligohydramnios, third trimester, not applicable or unspecified: Secondary | ICD-10-CM | POA: Diagnosis present

## 2021-04-10 DIAGNOSIS — Z3480 Encounter for supervision of other normal pregnancy, unspecified trimester: Secondary | ICD-10-CM

## 2021-04-10 DIAGNOSIS — Q796 Ehlers-Danlos syndrome, unspecified: Secondary | ICD-10-CM

## 2021-04-10 NOTE — Progress Notes (Signed)
? ?  PRENATAL VISIT NOTE ? ?Subjective:  ?Krista Lee is a 36 y.o. G2P0010 at [redacted]w[redacted]d being seen today for ongoing prenatal care.  She is currently monitored for the following issues for this low-risk pregnancy and has Female infertility; Missed abortion; Supervision of other normal pregnancy, antepartum; Ehlers-Danlos syndrome; and Hypokalemia on their problem list. ? ?Patient reports no complaints.  Contractions: Not present. Vag. Bleeding: None.  Movement: Present. Denies leaking of fluid.  ? ?The following portions of the patient's history were reviewed and updated as appropriate: allergies, current medications, past family history, past medical history, past social history, past surgical history and problem list.  ? ?Objective:  ? ?Vitals:  ? 04/10/21 1333  ?BP: 105/70  ?Pulse: 98  ?Weight: 161 lb (73 kg)  ? ? ?Fetal Status: Fetal Heart Rate (bpm): 142 Fundal Height: 37 cm Movement: Present    ? ?General:  Alert, oriented and cooperative. Patient is in no acute distress.  ?Skin: Skin is warm and dry. No rash noted.   ?Cardiovascular: Normal heart rate noted  ?Respiratory: Normal respiratory effort, no problems with respiration noted  ?Abdomen: Soft, gravid, appropriate for gestational age.  Pain/Pressure: Absent     ?Pelvic: Cervical exam deferred        ?Extremities: Normal range of motion.     ?Mental Status: Normal mood and affect. Normal behavior. Normal judgment and thought content.  ? ?Assessment and Plan:  ?Pregnancy: G2P0010 at [redacted]w[redacted]d ?1. Supervision of other normal pregnancy, antepartum ?Patient is doing well without complaints ?Patient is scheduled for follow up scan this afternoon and is certain that plan is for IOL at 38 weeks regardless of ultrasound results ?Will schedule for IOL at 38 weeks knowing that it can always be cancelled pending ultrasound report ? ?2. Ehlers-Danlos syndrome ? ? ?Term labor symptoms and general obstetric precautions including but not limited to vaginal bleeding,  contractions, leaking of fluid and fetal movement were reviewed in detail with the patient. ?Please refer to After Visit Summary for other counseling recommendations.  ? ?Return in about 6 weeks (around 05/22/2021) for postpartum. ? ?Future Appointments  ?Date Time Provider Department Center  ?04/10/2021  3:00 PM [facility] NURSE [facility]  ?04/10/2021  3:15 PM [facility] NST [facility]  ?04/11/2021 10:00 AM Mendelson, Hulda Humphrey, PhD [facility] None  ?04/17/2021 10:00 AM Mendelson, Hulda Humphrey, PhD [facility] None  ?04/17/2021  1:30 PM Reo Portela, Gigi Gin, MD [facility] None  ?04/24/2021  1:30 PM Marny Lowenstein, PA-C [facility] None  ?05/01/2021 10:00 AM Mendelson, Hulda Humphrey, PhD [facility] None  ?05/15/2021 10:00 AM Mendelson, Hulda Humphrey, PhD [facility] None  ?06/12/2021 10:00 AM Mendelson, Hulda Humphrey, PhD [facility] None  ?06/26/2021 10:00 AM Mendelson, Hulda Humphrey, PhD [facility] None  ?07/10/2021 10:00 AM Mendelson, Hulda Humphrey, PhD [facility] None  ?07/24/2021 10:00 AM Mendelson, Hulda Humphrey, PhD [facility] None  ?08/07/2021 10:00 AM Mendelson, Hulda Humphrey, PhD [facility] None  ?08/21/2021 10:00 AM Mendelson, Hulda Humphrey, PhD [facility] None  ?09/18/2021 10:00 AM Mendelson, Hulda Humphrey, PhD [facility] None  ?10/02/2021 10:00 AM Mendelson, Hulda Humphrey, PhD [facility] None  ?10/16/2021 10:00 AM Mendelson, Hulda Humphrey, PhD [facility] None  ?10/30/2021 10:00 AM Mendelson, Hulda Humphrey, PhD [facility] None  ?11/13/2021 10:00 AM Mendelson, Hulda Humphrey, PhD [facility] None  ?11/27/2021 10:00 AM Mendelson, Hulda Humphrey, PhD [facility] None  ?12/11/2021 10:00 AM Mendelson, Hulda Humphrey, PhD [facility] None  ? ? ?Catalina Antigua, MD ? ?

## 2021-04-10 NOTE — Progress Notes (Signed)
Pt reports fetal movement, denies pain. Pt states that she has been having trouble sleeping in the last week. ?

## 2021-04-10 NOTE — Procedures (Signed)
Krista Lee ?05-21-1985 ?[redacted]w[redacted]d ? ?Fetus A Non-Stress Test Interpretation for 04/10/21 ? ?Indication: Oligohydramnios ? ?Fetal Heart Rate A ?Mode: External ?Baseline Rate (A): 135 bpm ?Variability: Moderate ?Accelerations: 15 x 15 ?Decelerations: None ? ?Uterine Activity ?Mode: Palpation, Toco ?Contraction Frequency (min): none ?Resting Tone Palpated: Relaxed ? ?Interpretation (Fetal Testing) ?Nonstress Test Interpretation: Reactive ?Overall Impression: Reassuring for gestational age ?Comments: Dr. Parke Poisson reviewed tracing ? ? ?

## 2021-04-11 ENCOUNTER — Ambulatory Visit (INDEPENDENT_AMBULATORY_CARE_PROVIDER_SITE_OTHER): Payer: 59 | Admitting: Clinical

## 2021-04-11 DIAGNOSIS — F411 Generalized anxiety disorder: Secondary | ICD-10-CM

## 2021-04-11 NOTE — Progress Notes (Signed)
Diagnosis: F41.9 ?Time: 10:03am-10:59am ?CPT Code: 15726O-03 ? ?Krista Lee was seen remotely using secure video conferencing. She was in her home in [place] and the therapist was in her office at the time of the appointment. Session focused on discussing her feelings about her impending birth, and considering coping strategies. She also discussed how dynamics in her relationship may need to shift following the birth of her first child. She scheduled to be seen again in three weeks, and will reach out if she needs to be seen sooner. ? ?Treatment Plan ?Client Abilities/Strengths  ?[name] presented as insightful and motivated, with clear goals for therapy.  ?Client Treatment Preferences  ?[name] would like to begin with monthly appointments, and increase frequency once she has hit her insurance deductible.  ?Client Statement of Needs  ?[name] is seeking CBT to address anxiety and relationship challenges.  ?Treatment Level  ?Monthly  ?Symptoms  ?Anxiety : vivid, persistent thoughts of worst case scenarios that are difficult to disengage from (Status: maintained). Relational Challenges: Difficulty with mutual triggering of uncomfortable emotions in marriage (Status: maintained).  ?Problems Addressed  ?New Description, New Description  ?Goals ?1. Difficulty in relationship with husband ?Objective ?Development of strategies to regulate emotional response to husband's stress ?Target Date: 2022-03-03 Frequency: Biweekly  ?Progress: 0 Modality: individual  ?Related Interventions ?Therapist will work with [name] to develop communication strategies by helping her to consider how she might respond to different situations and offering opportunities to practice, such as through written exercises and role plays ?Therapist will work with [name] to develop her communication strategies, including labeling and expressing her emotions and using "I" statements ?2. Persistent anxiety triggered by thoughts of worse case scenarios that feel  real ?Objective ?Decrease in frequency and severity of anxiety, increase in overall well bein ?Target Date: 2022-03-03 Frequency: Biweekly  ?Progress: 0 Modality: individual  ?Related Interventions ?Therapist will provide referrals for additional resources as appropriate ?Therapist will provide [name] with strategies to regulate her emotions, including meditation, breathing exercises, mindfulness, and self-care. ?Therapist will help [name] to identify and disengage from maladaptive thought patterns using CBT-based strategies ?Therapist will provide [name] with opportunities to process her experiences in session ?Diagnosis ?Axis none 300.00 (Anxiety state, unspecified) - Open - [Signifier: n/a]    ?Conditions For Discharge ?Achievement of treatment goals and objectives ?Therapist Signature ___________________________ ?Patient Signature ___________________________ ? ? ? ?[name], PhD ? ? ? ? ? ? ? ? ? ? ? ? ? ? ?[name], PhD ? ? ? ? ? ? ? ? ? ? ? ? ? ? ?[name], PhD ? ? ? ? ? ? ? ? ? ? ? ? ? ? ?[name], PhD ? ? ? ? ? ? ? ? ? ? ? ? ? ? ?[name], PhD ?

## 2021-04-12 ENCOUNTER — Telehealth (HOSPITAL_COMMUNITY): Payer: Self-pay | Admitting: *Deleted

## 2021-04-12 ENCOUNTER — Encounter (HOSPITAL_COMMUNITY): Payer: Self-pay | Admitting: *Deleted

## 2021-04-12 NOTE — Telephone Encounter (Signed)
Preadmission screen  

## 2021-04-15 ENCOUNTER — Inpatient Hospital Stay (HOSPITAL_COMMUNITY): Admission: AD | Admit: 2021-04-15 | Payer: 59 | Source: Home / Self Care | Admitting: Obstetrics & Gynecology

## 2021-04-15 ENCOUNTER — Inpatient Hospital Stay (HOSPITAL_COMMUNITY): Payer: 59

## 2021-04-17 ENCOUNTER — Ambulatory Visit: Payer: 59 | Admitting: Clinical

## 2021-04-17 ENCOUNTER — Ambulatory Visit (INDEPENDENT_AMBULATORY_CARE_PROVIDER_SITE_OTHER): Payer: 59 | Admitting: Obstetrics and Gynecology

## 2021-04-17 ENCOUNTER — Other Ambulatory Visit: Payer: Self-pay | Admitting: Family Medicine

## 2021-04-17 ENCOUNTER — Encounter: Payer: Self-pay | Admitting: Obstetrics and Gynecology

## 2021-04-17 ENCOUNTER — Telehealth (HOSPITAL_COMMUNITY): Payer: Self-pay | Admitting: *Deleted

## 2021-04-17 VITALS — BP 122/80 | HR 120 | Wt 163.0 lb

## 2021-04-17 DIAGNOSIS — Z3480 Encounter for supervision of other normal pregnancy, unspecified trimester: Secondary | ICD-10-CM

## 2021-04-17 NOTE — Progress Notes (Signed)
? ?  PRENATAL VISIT NOTE ? ?Subjective:  ?Krista Lee is a 36 y.o. G2P0010 at [redacted]w[redacted]d being seen today for ongoing prenatal care.  She is currently monitored for the following issues for this low-risk pregnancy and has Female infertility; Missed abortion; Supervision of other normal pregnancy, antepartum; Ehlers-Danlos syndrome; and Hypokalemia on their problem list. ? ?Patient reports no complaints.  Contractions: Not present.  .  Movement: Present. Denies leaking of fluid.  ? ?The following portions of the patient's history were reviewed and updated as appropriate: allergies, current medications, past family history, past medical history, past social history, past surgical history and problem list.  ? ?Objective:  ? ?Vitals:  ? 04/17/21 1331  ?BP: 122/80  ?Pulse: (!) 120  ?Weight: 163 lb (73.9 kg)  ? ? ?Fetal Status: Fetal Heart Rate (bpm): 135 Fundal Height: 37 cm Movement: Present    ? ?General:  Alert, oriented and cooperative. Patient is in no acute distress.  ?Skin: Skin is warm and dry. No rash noted.   ?Cardiovascular: Normal heart rate noted  ?Respiratory: Normal respiratory effort, no problems with respiration noted  ?Abdomen: Soft, gravid, appropriate for gestational age.  Pain/Pressure: Present     ?Pelvic: Cervical exam deferred        ?Extremities: Normal range of motion.     ?Mental Status: Normal mood and affect. Normal behavior. Normal judgment and thought content.  ? ?Assessment and Plan:  ?Pregnancy: G2P0010 at [redacted]w[redacted]d ?1. Supervision of other normal pregnancy, antepartum ?Patient is doing well ?Answered questions regarding IOL tomorrow ? ?Term labor symptoms and general obstetric precautions including but not limited to vaginal bleeding, contractions, leaking of fluid and fetal movement were reviewed in detail with the patient. ?Please refer to After Visit Summary for other counseling recommendations.  ? ?No follow-ups on file. ? ?Future Appointments  ?Date Time Provider Department Center   ?04/18/2021  7:30 AM [facility] SCHED ROOM [facility] None  ?05/01/2021 10:00 AM Mendelson, Hulda Humphrey, PhD [facility] None  ?05/15/2021 10:00 AM Mendelson, Hulda Humphrey, PhD [facility] None  ?06/12/2021 10:00 AM Mendelson, Hulda Humphrey, PhD [facility] None  ?06/26/2021 10:00 AM Mendelson, Hulda Humphrey, PhD [facility] None  ?07/10/2021 10:00 AM Mendelson, Hulda Humphrey, PhD [facility] None  ?07/24/2021 10:00 AM Mendelson, Hulda Humphrey, PhD [facility] None  ?08/07/2021 10:00 AM Mendelson, Hulda Humphrey, PhD [facility] None  ?08/21/2021 10:00 AM Mendelson, Hulda Humphrey, PhD [facility] None  ?09/18/2021 10:00 AM Mendelson, Hulda Humphrey, PhD [facility] None  ?10/02/2021 10:00 AM Mendelson, Hulda Humphrey, PhD [facility] None  ?10/16/2021 10:00 AM Mendelson, Hulda Humphrey, PhD [facility] None  ?10/30/2021 10:00 AM Mendelson, Hulda Humphrey, PhD [facility] None  ?11/13/2021 10:00 AM Mendelson, Hulda Humphrey, PhD [facility] None  ?11/27/2021 10:00 AM Mendelson, Hulda Humphrey, PhD [facility] None  ?12/11/2021 10:00 AM Mendelson, Hulda Humphrey, PhD [facility] None  ? ? ?Catalina Antigua, MD ? ?

## 2021-04-17 NOTE — Telephone Encounter (Signed)
Preadmission screen  

## 2021-04-18 ENCOUNTER — Encounter (HOSPITAL_COMMUNITY): Payer: Self-pay | Admitting: Family Medicine

## 2021-04-18 ENCOUNTER — Inpatient Hospital Stay (HOSPITAL_COMMUNITY): Payer: 59

## 2021-04-18 ENCOUNTER — Inpatient Hospital Stay (HOSPITAL_COMMUNITY)
Admission: AD | Admit: 2021-04-18 | Discharge: 2021-04-21 | DRG: 807 | Disposition: A | Discharge status: Home / Self Care | Payer: 59 | Attending: Obstetrics and Gynecology | Admitting: Obstetrics and Gynecology

## 2021-04-18 ENCOUNTER — Other Ambulatory Visit: Payer: Self-pay

## 2021-04-18 ENCOUNTER — Inpatient Hospital Stay (HOSPITAL_COMMUNITY): Payer: 59 | Admitting: Anesthesiology

## 2021-04-18 DIAGNOSIS — Z87891 Personal history of nicotine dependence: Secondary | ICD-10-CM | POA: Diagnosis not present

## 2021-04-18 DIAGNOSIS — O4103X0 Oligohydramnios, third trimester, not applicable or unspecified: Principal | ICD-10-CM | POA: Diagnosis present

## 2021-04-18 DIAGNOSIS — O4103X1 Oligohydramnios, third trimester, fetus 1: Secondary | ICD-10-CM | POA: Diagnosis not present

## 2021-04-18 DIAGNOSIS — Z3480 Encounter for supervision of other normal pregnancy, unspecified trimester: Secondary | ICD-10-CM

## 2021-04-18 DIAGNOSIS — O4100X0 Oligohydramnios, unspecified trimester, not applicable or unspecified: Secondary | ICD-10-CM | POA: Diagnosis present

## 2021-04-18 DIAGNOSIS — Z3A38 38 weeks gestation of pregnancy: Secondary | ICD-10-CM

## 2021-04-18 DIAGNOSIS — O43123 Velamentous insertion of umbilical cord, third trimester: Secondary | ICD-10-CM | POA: Diagnosis present

## 2021-04-18 LAB — CBC
HCT: 35.1 % — ABNORMAL LOW (ref 36.0–46.0)
Hemoglobin: 12 g/dL (ref 12.0–15.0)
MCH: 30.8 pg (ref 26.0–34.0)
MCHC: 34.2 g/dL (ref 30.0–36.0)
MCV: 90 fL (ref 80.0–100.0)
Platelets: 237 10*3/uL (ref 150–400)
RBC: 3.9 MIL/uL (ref 3.87–5.11)
RDW: 14.5 % (ref 11.5–15.5)
WBC: 9 10*3/uL (ref 4.0–10.5)
nRBC: 0 % (ref 0.0–0.2)

## 2021-04-18 LAB — RPR: RPR Ser Ql: NONREACTIVE

## 2021-04-18 LAB — TYPE AND SCREEN
ABO/RH(D): A POS
Antibody Screen: NEGATIVE

## 2021-04-18 MED ORDER — TERBUTALINE SULFATE 1 MG/ML IJ SOLN: 0.2500 mg | Freq: Once | INTRAMUSCULAR | Status: DC | PRN

## 2021-04-18 MED ORDER — MISOPROSTOL 25 MCG QUARTER TABLET
25.0000 ug | ORAL_TABLET | ORAL | Status: DC
  Filled 2021-04-18: qty 1

## 2021-04-18 MED ORDER — DIPHENHYDRAMINE HCL 50 MG/ML IJ SOLN: 12.5000 mg | INTRAMUSCULAR | Status: DC | PRN

## 2021-04-18 MED ORDER — ONDANSETRON HCL 4 MG/2ML IJ SOLN
4.0000 mg | Freq: Four times a day (QID) | INTRAMUSCULAR | Status: DC | PRN
  Administered 2021-04-18: 4 mg via INTRAVENOUS
  Filled 2021-04-18: qty 2

## 2021-04-18 MED ORDER — EPHEDRINE 5 MG/ML INJ
10.0000 mg | INTRAVENOUS | Status: DC | PRN
  Filled 2021-04-18: qty 5

## 2021-04-18 MED ORDER — PHENYLEPHRINE 80 MCG/ML (10ML) SYRINGE FOR IV PUSH (FOR BLOOD PRESSURE SUPPORT)
80.0000 ug | PREFILLED_SYRINGE | INTRAVENOUS | Status: DC | PRN
  Filled 2021-04-18: qty 10

## 2021-04-18 MED ORDER — PHENYLEPHRINE 80 MCG/ML (10ML) SYRINGE FOR IV PUSH (FOR BLOOD PRESSURE SUPPORT): 80.0000 ug | PREFILLED_SYRINGE | INTRAVENOUS | Status: DC | PRN

## 2021-04-18 MED ORDER — OXYTOCIN-SODIUM CHLORIDE 30-0.9 UT/500ML-% IV SOLN: 2.5000 [IU]/h | INTRAVENOUS | Status: DC

## 2021-04-18 MED ORDER — EPHEDRINE 5 MG/ML INJ: 10.0000 mg | INTRAVENOUS | Status: DC | PRN

## 2021-04-18 MED ORDER — LACTATED RINGERS IV SOLN: 500.0000 mL | INTRAVENOUS | Status: DC | PRN

## 2021-04-18 MED ORDER — MISOPROSTOL 50MCG HALF TABLET
50.0000 ug | ORAL_TABLET | ORAL | Status: DC
  Administered 2021-04-18 (×2): 50 ug via ORAL
  Filled 2021-04-18 (×2): qty 1

## 2021-04-18 MED ORDER — OXYTOCIN-SODIUM CHLORIDE 30-0.9 UT/500ML-% IV SOLN
1.0000 m[IU]/min | INTRAVENOUS | Status: DC
  Administered 2021-04-18: 2 m[IU]/min via INTRAVENOUS
  Filled 2021-04-18: qty 500

## 2021-04-18 MED ORDER — FENTANYL-BUPIVACAINE-NACL 0.5-0.125-0.9 MG/250ML-% EP SOLN
EPIDURAL | Status: DC | PRN
  Administered 2021-04-18: 12 mL/h via EPIDURAL

## 2021-04-18 MED ORDER — SOD CITRATE-CITRIC ACID 500-334 MG/5ML PO SOLN
30.0000 mL | ORAL | Status: DC | PRN
  Administered 2021-04-18 (×2): 30 mL via ORAL
  Filled 2021-04-18 (×2): qty 30

## 2021-04-18 MED ORDER — ACETAMINOPHEN 325 MG PO TABS
650.0000 mg | ORAL_TABLET | ORAL | Status: DC | PRN
  Administered 2021-04-18: 650 mg via ORAL
  Filled 2021-04-18: qty 2

## 2021-04-18 MED ORDER — LACTATED RINGERS IV SOLN
500.0000 mL | Freq: Once | INTRAVENOUS | Status: AC
  Administered 2021-04-18: 500 mL via INTRAVENOUS

## 2021-04-18 MED ORDER — OXYCODONE-ACETAMINOPHEN 5-325 MG PO TABS: 1.0000 | ORAL_TABLET | ORAL | Status: DC | PRN

## 2021-04-18 MED ORDER — FAMOTIDINE IN NACL 20-0.9 MG/50ML-% IV SOLN
20.0000 mg | Freq: Once | INTRAVENOUS | Status: AC
  Administered 2021-04-18: 20 mg via INTRAVENOUS
  Filled 2021-04-18: qty 50

## 2021-04-18 MED ORDER — FENTANYL-BUPIVACAINE-NACL 0.5-0.125-0.9 MG/250ML-% EP SOLN
12.0000 mL/h | EPIDURAL | Status: DC | PRN
  Filled 2021-04-18: qty 250

## 2021-04-18 MED ORDER — LIDOCAINE HCL (PF) 1 % IJ SOLN
INTRAMUSCULAR | Status: DC | PRN
  Administered 2021-04-18: 2 mL via EPIDURAL
  Administered 2021-04-18: 10 mL via EPIDURAL

## 2021-04-18 MED ORDER — OXYTOCIN BOLUS FROM INFUSION
333.0000 mL | Freq: Once | INTRAVENOUS | Status: AC
  Administered 2021-04-19: 333 mL via INTRAVENOUS

## 2021-04-18 MED ORDER — LACTATED RINGERS IV SOLN
INTRAVENOUS | Status: DC
Start: 2021-04-18 — End: 2021-04-19

## 2021-04-18 MED ORDER — LIDOCAINE HCL (PF) 1 % IJ SOLN: 30.0000 mL | INTRAMUSCULAR | Status: DC | PRN

## 2021-04-18 MED ORDER — OXYCODONE-ACETAMINOPHEN 5-325 MG PO TABS: 2.0000 | ORAL_TABLET | ORAL | Status: DC | PRN

## 2021-04-18 NOTE — Progress Notes (Signed)
Krista Lee is a 36 y.o. G2P0010 at [redacted]w[redacted]d  ? ?Subjective: ?Resting in bed, partner at bedside and engaged in care. Denies questions, concerns. ? ?Objective: ?BP 108/65   Pulse 71   Temp 98.2 ?F (36.8 ?C) (Oral)   Ht 5\' 7"  (1.702 m)   Wt 74 kg   LMP 07/23/2020   BMI 25.56 kg/m?  ?No intake/output data recorded. ?No intake/output data recorded. ? ?FHT:  FHR: 125 bpm, variability: moderate,  accelerations:  Present,  decelerations:  Absent ?UC:   irregular, every 6 minutes ?SVE:   Dilation: 1.5 ?Effacement (%): 50 ?Station: -1 ?Exam by:: [email or web address], CNM ? ?Labs: ?Lab Results  ?Component Value Date  ? WBC 9.0 04/18/2021  ? HGB 12.0 04/18/2021  ? HCT 35.1 (L) 04/18/2021  ? MCV 90.0 04/18/2021  ? PLT 237 04/18/2021  ? ? ?Assessment / Plan: ?--36 y.o. G2P0010 at [redacted]w[redacted]d  ?-Cat I tracing ?--S/p Cytotec # 1 at 0818 ?--Foley bulb placed at 1010 (60 mL), tolerated very well ?--Continue cytotec q 4 hours while FB in place ?--Plan for light meal, shower PRN once FB dislodged then assess for Pitocin initiation ?--Anticipate vaginal birth ? ?[redacted]w[redacted]d, CNM ?04/18/2021, 12:13 PM ? ? ?

## 2021-04-18 NOTE — H&P (Addendum)
OBSTETRIC ADMISSION HISTORY AND PHYSICAL ? ?Krista Lee is a 36 y.o. female G2P0010 with IUP at 74w3dby LMP presenting for IOL 2/2 low normal AFI. She reports +FMs, No LOF, no VB, no blurry vision, headaches or peripheral edema, and RUQ pain.  She plans on breast feeding. She request nuvaring for birth control. ?[facility] ? ?Dating: By LMP --->  Estimated Date of Delivery: 04/29/21 ? ?Sono:   ? ?'@[redacted]w[redacted]d' , CWD, normal anatomy,posterior placental lie, 2619g, 24% EFW ? ? ?Prenatal History/Complications:  ?-- oligohydramnios  ?-- ehlers-danlos syndrome  ? ?Past Medical History: ?Past Medical History:  ?Diagnosis Date  ? ADD (attention deficit disorder)   ? Allergy   ? no meds  ? Anemia   ? as adolescent  ? Anxiety   ? Depression   ? GERD (gastroesophageal reflux disease)   ? doesn't take any meds for this, diet controlled  ? Headache(784.0)   ? Heart murmur   ? as a child - no problems  ? History of bronchitis   ? as a teenager, no problems as adult  ? History of migraine   ? can't remember when the last one was  ? Insomnia   ? no meds  ? Panic attacks   ? Symptomatic cholelithiasis 09/25/2011  ? UTI (urinary tract infection)   ? ? ?Past Surgical History: ?Past Surgical History:  ?Procedure Laterality Date  ? CHOLECYSTECTOMY  10/22/2011  ? Procedure: LAPAROSCOPIC CHOLECYSTECTOMY;  Surgeon: DHarl Bowie MD;  [facility]  Service: General;  Laterality: N/A;  ? [place] 12[place]  ? Procedure: DILATATION AND EVACUATION;  Surgeon: CMora Bellman MD;  [place] [facility]  Service: Gynecology;  Laterality: N/A;  NEEDS ULTRASOUND GUIDANCE ?ANORA KIT SENT   ? HYSTEROSCOPY WITH RESECTOSCOPE N/A 04/28/2013  ? Procedure:  HYSTEROSCOPY ;  Surgeon: MPrincess Bruins MD;  [facility];  Service: Gynecology;  Laterality: N/A;  ? LAPAROSCOPY N/A 04/28/2013  ? Procedure: LAPAROSCOPY DIAGNOSTIC;  Surgeon: MPrincess Bruins MD;  [facility];  Service:  Gynecology;  Laterality: N/A;  2 hrs. total  ? OPERATIVE ULTRASOUND N/A 12/07/2019  ? Procedure: OPERATIVE ULTRASOUND;  Surgeon: CMora Bellman MD;  [place] [facility]  Service: Gynecology;  Laterality: N/A;  ? WISDOM TOOTH EXTRACTION  [address]  ? ? ?Obstetrical History: ?OB History   ? ? Gravida  ?2  ? Para  ?0  ? Term  ?0  ? Preterm  ?0  ? AB  ?1  ? Living  ?0  ?  ? ? SAB  ?1  ? IAB  ?0  ? Ectopic  ?0  ? Multiple  ?0  ? Live Births  ?0  ?   ?  ?  ? ? ?Social History ?Social History  ? ?Socioeconomic History  ? Marital status: Married  ?  Spouse name: Not on file  ? Number of children: Not on file  ? Years of education: Not on file  ? Highest education level: Not on file  ?Occupational History  ? Occupation: Hair Stylist  ?Tobacco Use  ? Smoking status: Former  ?  Packs/day: 0.50  ?  Years: 8.00  ?  Pack years: 4.00  ?  Types: Cigarettes  ?  Quit date: 08/2019  ?  Years since quitting: 1.7  ? Smokeless tobacco: Former  ?  Quit date: 01/02/2011  ?Vaping Use  ? Vaping Use: Never used  ?Substance and Sexual  Activity  ? Alcohol use: Not Currently  ?  Alcohol/week: 14.0 standard drinks  ?  Types: 14 Standard drinks or equivalent per week  ?  Comment: not since confirmed pregnancy  ? Drug use: No  ? Sexual activity: Not Currently  ?  Partners: Male  ?  Birth control/protection: None  ?Other Topics Concern  ? Not on file  ?Social History Narrative  ? Not on file  ? ?Social Determinants of Health  ? ?Financial Resource Strain: Not on file  ?Food Insecurity: Not on file  ?Transportation Needs: Not on file  ?Physical Activity: Not on file  ?Stress: Not on file  ?Social Connections: Not on file  ? ? ?Family History: ?Family History  ?Problem Relation Age of Onset  ? Hypertension Mother   ? Diverticulitis Mother   ? Hyperlipidemia Mother   ? Arthritis Father   ? Rheum arthritis Paternal Grandmother   ? ? ?Allergies: ?Allergies  ?Allergen Reactions  ? Prozac [Fluoxetine Hcl] Other (See Comments)  ?  Sucidial  ideations and violent  ? Citrus Nausea And Vomiting  ?  Oranges  ? Ibuprofen Other (See Comments)  ?  Childhood reaction of high fever  ? Percocet [Oxycodone-Acetaminophen] Nausea Only  ?  Pt can take regular tylenol but in combination with codeine products is makes her very sick to her stomach.  ? ? ?Medications Prior to Admission  ?Medication Sig Dispense Refill Last Dose  ? acetaminophen (TYLENOL) 500 MG tablet Take 1,000 mg by mouth every 6 (six) hours as needed for moderate pain.     ? Calcium Carbonate Antacid (TUMS PO) Take by mouth.     ? cetirizine (ZYRTEC) 10 MG tablet Take 10 mg by mouth daily.     ? famotidine (PEPCID) 10 MG tablet Take 10 mg by mouth 2 (two) times daily.     ? Misc. Devices (BREAST PUMP) MISC 1 Device by Does not apply route daily. (Patient not taking: Reported on 04/03/2021) 1 each 0   ? potassium chloride (KLOR-CON M) 10 MEQ tablet Take 1 tablet (10 mEq total) by mouth daily for 4 doses. (Patient not taking: Reported on 04/10/2021) 4 tablet 0   ? Prenatal Vit-Fe Fumarate-FA (MULTIVITAMIN-PRENATAL) 27-0.8 MG TABS tablet Take 1 tablet by mouth daily at 12 noon.     ? Probiotic Product (PROBIOTIC-10 PO) Take by mouth.     ? VITAMIN D PO Take by mouth.     ? ? ? ?Review of Systems  ? ?All systems reviewed and negative except as stated in HPI ? ?Blood pressure 121/72, pulse 73, height '5\' 7"'  (1.702 m), weight 74 kg, last menstrual period 07/23/2020, unknown if currently breastfeeding. ?General appearance: alert, cooperative, and no distress ?Lungs: clear to auscultation bilaterally ?Heart: regular rate and rhythm ?Abdomen: soft, non-tender; bowel sounds normal ?Extremities: Homans sign is negative, no sign of DVT ?Presentation: cephalic ?Fetal monitoringBaseline: 130 bpm, Variability: Good {> 6 bpm), and Accelerations: Reactive ?Uterine activityNone ?Dilation: 1 ?Effacement (%): 50 ?Station: -1 ?Exam by:: J.Cox, RN ? ? ?Prenatal labs: ?ABO, Rh: --/--/A POS (04/18 1096) ?Antibody: NEG (04/18  0705) ?Rubella: 3.84 (10/03 1010) ?RPR: Non Reactive (02/06 0917)  ?HBsAg: Negative (10/03 1010)  ?HIV: Non Reactive (02/06 0917)  ?GBS: Negative/-- (04/03 1446)  ?GTT: normal  ?Genetic screening  Nips: low risk; AFP: negative screen ?Anatomy US normal ? ?Prenatal Transfer Tool  ?Maternal Diabetes: No ?Genetic Screening: Normal ?Maternal Ultrasounds/Referrals: Other: oligohydramnios  ?Fetal Ultrasounds or other Referrals:  Referred to [facility]  Fetal Medicine  ?Maternal Substance Abuse:  No ?Significant Maternal Medications:  None ?Significant Maternal Lab Results: Group B Strep negative ? ?Results for orders placed or performed during the hospital encounter of 04/18/21 (from the past 24 hour(s))  ?CBC  ? Collection Time: 04/18/21  7:05 AM  ?Result Value Ref Range  ? WBC 9.0 4.0 - 10.5 K/uL  ? RBC 3.90 3.87 - 5.11 MIL/uL  ? Hemoglobin 12.0 12.0 - 15.0 g/dL  ? HCT 35.1 (L) 36.0 - 46.0 %  ? MCV 90.0 80.0 - 100.0 fL  ? MCH 30.8 26.0 - 34.0 pg  ? MCHC 34.2 30.0 - 36.0 g/dL  ? RDW 14.5 11.5 - 15.5 %  ? Platelets 237 150 - 400 K/uL  ? nRBC 0.0 0.0 - 0.2 %  ?Type and screen  ? Collection Time: 04/18/21  7:05 AM  ?Result Value Ref Range  ? ABO/RH(D) A POS   ? Antibody Screen NEG   ? Sample Expiration    ?  04/21/2021,2359 ?Performed at [facility], [place] [address], [place], [place] [address] ?  ? ? ?Patient Active Problem List  ? Diagnosis Date Noted  ? Oligohydramnios 04/18/2021  ? Hypokalemia 04/06/2021  ? Ehlers-Danlos syndrome 02/06/2021  ? Supervision of other normal pregnancy, antepartum 09/26/2020  ? Missed abortion   ? Female infertility 08/17/2019  ? ? ?Assessment/Plan:  ?Krista Lee is a 36 y.o. G2P0010 at 50w3dhere for IOL 2/2 low normal AFI ? ?#Labor: Start induction of labor with cytotec @ 0818.  Will recheck patient in 4 hours and then can consider an additional dose of cytotec or placement of a foley balloon  ?#Pain: PRN, would like an epidural ?#FWB: Cat 1 ?#ID:  GBS negative  ?#MOF: Breast ?#MOC:  N[place]aring  ?#Circ:  Undecided  ? ?JNoralee Stain DO, PGY-1 ?04/18/2021, 9:26 AM ? ? Attestation of Supervision of Student:  I confirm that I have verified the information documented in the r

## 2021-04-18 NOTE — Progress Notes (Signed)
Patient ID: Krista Lee, female   DOB: 09-27-1985, 36 y.o.   MRN: [id number] ?ALMA MOHIUDDIN is a 36 y.o. G2P0010 at [redacted]w[redacted]d admitted for induction of labor due to borderline low AFI  6cm @ 37wks ? ?Subjective: ?uncomfortable w/ contractions, may want epidural soon ? ?Objective: ?BP (!) 107/47   Pulse (!) 58   Temp 97.9 ?F (36.6 ?C) (Oral)   Resp 18   Ht 5\' 7"  (1.702 m)   Wt 74 kg   LMP 07/23/2020   BMI 25.56 kg/m?  ?No intake/output data recorded. ? ?FHR baseline 145 bpm, Variability: moderate, Accelerations:present, Decelerations:  Absent ?Toco: not tracing well d/t pt position, app q 07/25/2020 while we were in room  ? ?SVE:   Dilation: 4 ?Effacement (%): 70 ?Station: -2 ?Exam by:: J.Cox, RN ? ?Pitocin @ 10 mu/min ? ?Labs: ?Lab Results  ?Component Value Date  ? WBC 9.0 04/18/2021  ? HGB 12.0 04/18/2021  ? HCT 35.1 (L) 04/18/2021  ? MCV 90.0 04/18/2021  ? PLT 237 04/18/2021  ? ? ?Assessment / Plan: ?IOL d/t borderline low AFI, s/p foley bulb and cytotec x 2 , now on pit @ 23mu/min, getting uncomfortable and close to wanting epidural. Declines SVE until after epidural ? ?Labor: early ?Fetal Wellbeing:  Category I ?Pain Control:  labor support without medications ?Pre-eclampsia: N/A ?I/D:  GBS neg ?Anticipated MOD: NSVB ? ?9m CNM, WHNP-BC ?04/18/2021, 9:50 PM  ?

## 2021-04-18 NOTE — Progress Notes (Signed)
Krista Lee is a 36 y.o. G2P0010 at [redacted]w[redacted]d  ? ?Subjective: ?Reporting occasional contractions, pain score much improved from discomfort immediately after foley placement. ? ?Objective: ?BP (!) 98/49   Pulse 62   Temp 98 ?F (36.7 ?C) (Oral)   Ht 5\' 7"  (1.702 m)   Wt 74 kg   LMP 07/23/2020   BMI 25.56 kg/m?  ?No intake/output data recorded. ?No intake/output data recorded. ? ?FHT:  FHR: 125 bpm, variability: moderate,  accelerations:  Present,  decelerations:  Absent ?UC:   irregular, every 5 minutes ?SVE:   Dilation: 1.5 ?Effacement (%): 50 ?Station: -1 ?Exam by:: [email or web address], CNM ? ?Labs: ?Lab Results  ?Component Value Date  ? WBC 9.0 04/18/2021  ? HGB 12.0 04/18/2021  ? HCT 35.1 (L) 04/18/2021  ? MCV 90.0 04/18/2021  ? PLT 237 04/18/2021  ? ? ?Assessment / Plan: ?--Labor update received from 04/20/2021, RN ?--Foley bulb remains in place ?--Will switch to Vaginal Cytotec, continue while foley in place ?--Anticipate vaginal delivery ? ?Aldean Baker, CNM ?04/18/2021, 4:10 PM ? ? ?

## 2021-04-18 NOTE — Progress Notes (Signed)
Krista Lee is a 36 y.o. G2P0010 at [redacted]w[redacted]d admitted for induction of labor due to low normal AFI ? ?Subjective: ?Patient is semi-fowlers, comfortable with epidural and denies any needs at this time. She consents to sterile vag exam. ? ?Objective: ?BP (!) 114/34   Pulse 87   Temp 97.9 ?F (36.6 ?C) (Oral)   Resp 18   Ht 5\' 7"  (1.702 m)   Wt 74 kg   LMP 07/23/2020   SpO2 100%   BMI 25.56 kg/m?  ?No intake/output data recorded. ? ?FHR baseline 140 bpm, Variability: moderate, Accelerations:present, Decelerations:  Present  variable ?Toco: 2-3 minutes apart ? ?SVE:   Dilation: 7.5 ?Effacement (%): 80 ?Station: -2 ?Exam by:: J.Cox, RN ? ?Pitocin @ 10 mu/min ? ?Labs: ?Lab Results  ?Component Value Date  ? WBC 9.0 04/18/2021  ? HGB 12.0 04/18/2021  ? HCT 35.1 (L) 04/18/2021  ? MCV 90.0 04/18/2021  ? PLT 237 04/18/2021  ? ? ?Assessment / Plan: ?Active management: Pitocin 2x2. Pitocin at 68mu. ?Pain management: epidural ?Sterile vag exam at reassessment unless patient notifies she has the urge to push. ? ?Labor: active ?Fetal Wellbeing:  Category II ?Pain Control:  epidural ?Pre-eclampsia: N/A ?I/D:  GBS neg ?Anticipated MOD: hopeful for vaginal birth ? ?9m Krista Lee , SNM ?04/18/2021, 11:21 PM  ?

## 2021-04-18 NOTE — Progress Notes (Signed)
Krista Lee is a 36 y.o. G2P0010 at [redacted]w[redacted]d  ? ?Subjective: ?Finishing light meal, feeling better now that foley bulb has dislodged.  ? ?Objective: ?BP (!) 98/49   Pulse 62   Temp 98 ?F (36.7 ?C) (Oral)   Ht 5\' 7"  (1.702 m)   Wt 74 kg   LMP 07/23/2020   BMI 25.56 kg/m?  ?No intake/output data recorded. ?No intake/output data recorded. ? ?FHT:  FHR: 125 bpm, variability: moderate,  accelerations:  Present,  decelerations:  Absent ?UC:   irregular, every 6 minutes ?SVE:   Dilation: 4 ?Effacement (%): 70 ?Station: -2 ?Exam by:: J.Cox, RN ? ?Labs: ?Lab Results  ?Component Value Date  ? WBC 9.0 04/18/2021  ? HGB 12.0 04/18/2021  ? HCT 35.1 (L) 04/18/2021  ? MCV 90.0 04/18/2021  ? PLT 237 04/18/2021  ? ? ?Assessment / Plan: ?--S/p foley bulb and Cytotec x 2 ?--Initiate Pitocin 2 x 2 now ?--Cat I tracing ?--Anticipate vaginal delivery ? ?04/20/2021, CNM ?04/18/2021, 5:50 PM ? ? ?

## 2021-04-18 NOTE — Progress Notes (Signed)
Labor course reviewed with RN. Foley bulb dislodged. Patient to have snack and stretch break. Plan to start Pitocin 2 x 2 around 1730 ? ?Mallie Snooks, MSA, MSN, CNM ?Certified Nurse Midwife, [place] [facility] [place] ? ?

## 2021-04-18 NOTE — Anesthesia Preprocedure Evaluation (Signed)
Anesthesia Evaluation  ?Patient identified by MRN, date of birth, ID band ?Patient awake ? ? ? ?Reviewed: ?Allergy & Precautions, Patient's Chart, lab work & pertinent test results ? ?Airway ?Mallampati: II ? ?TM Distance: >3 FB ?Neck ROM: Full ? ? ? Dental ?no notable dental hx. ? ?  ?Pulmonary ?Patient abstained from smoking., former smoker,  ?Quit smoking 2021 ?  ?Pulmonary exam normal ?breath sounds clear to auscultation ? ? ? ? ? ? Cardiovascular ?negative cardio ROS ?Normal cardiovascular exam ?Rhythm:Regular Rate:Normal ? ? ?  ?Neuro/Psych ? Headaches, PSYCHIATRIC DISORDERS Anxiety Depression   ? GI/Hepatic ?negative GI ROS, Neg liver ROS,   ?Endo/Other  ?negative endocrine ROS ? Renal/GU ?negative Renal ROS  ?negative genitourinary ?  ?Musculoskeletal ?negative musculoskeletal ROS ?(+)  ? Abdominal ?  ?Peds ?negative pediatric ROS ?(+)  Hematology ?Hb 12, plt 237   ?Anesthesia Other Findings ? ? Reproductive/Obstetrics ?(+) Pregnancy ? ?  ? ? ? ? ? ? ? ? ? ? ? ? ? ?  ?  ? ? ? ? ? ? ? ? ?Anesthesia Physical ?Anesthesia Plan ? ?ASA: 2 ? ?Anesthesia Plan: Epidural  ? ?Post-op Pain Management:   ? ?Induction:  ? ?PONV Risk Score and Plan: 2 ? ?Airway Management Planned: Natural Airway ? ?Additional Equipment: None ? ?Intra-op Plan:  ? ?Post-operative Plan:  ? ?Informed Consent: I have reviewed the patients History and Physical, chart, labs and discussed the procedure including the risks, benefits and alternatives for the proposed anesthesia with the patient or authorized representative who has indicated his/her understanding and acceptance.  ? ? ? ? ? ?Plan Discussed with:  ? ?Anesthesia Plan Comments:   ? ? ? ? ? ? ?Anesthesia Quick Evaluation ? ?

## 2021-04-18 NOTE — Anesthesia Procedure Notes (Signed)
Epidural ?Patient location during procedure: OB ?Start time: 04/18/2021 10:13 PM ?End time: 04/18/2021 10:20 PM ? ?Staffing ?Anesthesiologist: Pervis Hocking, DO ?Performed: anesthesiologist  ? ?Preanesthetic Checklist ?Completed: patient identified, IV checked, risks and benefits discussed, monitors and equipment checked, pre-op evaluation and timeout performed ? ?Epidural ?Patient position: sitting ?Prep: DuraPrep and site prepped and draped ?Patient monitoring: continuous pulse ox, blood pressure, heart rate and cardiac monitor ?Approach: midline ?Location: L3-L4 ?Injection technique: LOR air ? ?Needle:  ?Needle type: Tuohy  ?Needle gauge: 17 G ?Needle length: 9 cm ?Needle insertion depth: 5 cm ?Catheter type: closed end flexible ?Catheter size: 19 Gauge ?Catheter at skin depth: 10 cm ?Test dose: negative ? ?Assessment ?Sensory level: T8 ?Events: blood not aspirated, injection not painful, no injection resistance, no paresthesia and negative IV test ? ?Additional Notes ?Patient identified. Risks/Benefits/Options discussed with patient including but not limited to bleeding, infection, nerve damage, paralysis, failed block, incomplete pain control, headache, blood pressure changes, nausea, vomiting, reactions to medication both or allergic, itching and postpartum back pain. Confirmed with bedside nurse the patient's most recent platelet count. Confirmed with patient that they are not currently taking any anticoagulation, have any bleeding history or any family history of bleeding disorders. Patient expressed understanding and wished to proceed. All questions were answered. Sterile technique was used throughout the entire procedure. Please see nursing notes for vital signs. Test dose was given through epidural catheter and negative prior to continuing to dose epidural or start infusion. Warning signs of high block given to the patient including shortness of breath, tingling/numbness in hands, complete motor  block, or any concerning symptoms with instructions to call for help. Patient was given instructions on fall risk and not to get out of bed. All questions and concerns addressed with instructions to call with any issues or inadequate analgesia.  Reason for block:procedure for pain ? ? ? ?

## 2021-04-19 ENCOUNTER — Encounter (HOSPITAL_COMMUNITY): Payer: Self-pay | Admitting: Family Medicine

## 2021-04-19 DIAGNOSIS — O4103X1 Oligohydramnios, third trimester, fetus 1: Secondary | ICD-10-CM

## 2021-04-19 DIAGNOSIS — Z3A38 38 weeks gestation of pregnancy: Secondary | ICD-10-CM

## 2021-04-19 MED ORDER — SENNOSIDES-DOCUSATE SODIUM 8.6-50 MG PO TABS
2.0000 | ORAL_TABLET | Freq: Every day | ORAL | Status: DC
Start: 2021-04-20 — End: 2021-04-21
  Administered 2021-04-20 – 2021-04-21 (×2): 2 via ORAL
  Filled 2021-04-19 (×2): qty 2

## 2021-04-19 MED ORDER — ZOLPIDEM TARTRATE 5 MG PO TABS: 5.0000 mg | ORAL_TABLET | Freq: Every evening | ORAL | Status: DC | PRN

## 2021-04-19 MED ORDER — PRENATAL MULTIVITAMIN CH
1.0000 | ORAL_TABLET | Freq: Every day | ORAL | Status: DC
Start: 2021-04-19 — End: 2021-04-21
  Administered 2021-04-19 – 2021-04-21 (×3): 1 via ORAL
  Filled 2021-04-19 (×3): qty 1

## 2021-04-19 MED ORDER — TETANUS-DIPHTH-ACELL PERTUSSIS 5-2.5-18.5 LF-MCG/0.5 IM SUSY: 0.5000 mL | PREFILLED_SYRINGE | Freq: Once | INTRAMUSCULAR | Status: DC

## 2021-04-19 MED ORDER — MEASLES, MUMPS & RUBELLA VAC IJ SOLR: 0.5000 mL | Freq: Once | INTRAMUSCULAR | Status: DC

## 2021-04-19 MED ORDER — SODIUM CHLORIDE 0.9 % IV SOLN: 250.0000 mL | INTRAVENOUS | Status: DC | PRN

## 2021-04-19 MED ORDER — SODIUM CHLORIDE 0.9% FLUSH
3.0000 mL | Freq: Two times a day (BID) | INTRAVENOUS | Status: DC
  Administered 2021-04-19: 3 mL via INTRAVENOUS

## 2021-04-19 MED ORDER — SIMETHICONE 80 MG PO CHEW: 80.0000 mg | CHEWABLE_TABLET | ORAL | Status: DC | PRN

## 2021-04-19 MED ORDER — COCONUT OIL OIL
1.0000 "application " | TOPICAL_OIL | Status: DC | PRN
  Administered 2021-04-20: 1 via TOPICAL

## 2021-04-19 MED ORDER — FLEET ENEMA 7-19 GM/118ML RE ENEM: 1.0000 | ENEMA | Freq: Every day | RECTAL | Status: DC | PRN

## 2021-04-19 MED ORDER — SODIUM CHLORIDE 0.9% FLUSH: 3.0000 mL | INTRAVENOUS | Status: DC | PRN

## 2021-04-19 MED ORDER — BENZOCAINE-MENTHOL 20-0.5 % EX AERO: 1.0000 "application " | INHALATION_SPRAY | CUTANEOUS | Status: DC | PRN

## 2021-04-19 MED ORDER — BISACODYL 10 MG RE SUPP: 10.0000 mg | Freq: Every day | RECTAL | Status: DC | PRN

## 2021-04-19 MED ORDER — ONDANSETRON HCL 4 MG PO TABS: 4.0000 mg | ORAL_TABLET | ORAL | Status: DC | PRN

## 2021-04-19 MED ORDER — ACETAMINOPHEN 325 MG PO TABS
650.0000 mg | ORAL_TABLET | ORAL | Status: DC | PRN
  Administered 2021-04-19 – 2021-04-20 (×2): 650 mg via ORAL
  Filled 2021-04-19 (×2): qty 2

## 2021-04-19 MED ORDER — DIBUCAINE (PERIANAL) 1 % EX OINT: 1.0000 "application " | TOPICAL_OINTMENT | CUTANEOUS | Status: DC | PRN

## 2021-04-19 MED ORDER — DIPHENHYDRAMINE HCL 25 MG PO CAPS: 25.0000 mg | ORAL_CAPSULE | Freq: Four times a day (QID) | ORAL | Status: DC | PRN

## 2021-04-19 MED ORDER — ONDANSETRON HCL 4 MG/2ML IJ SOLN: 4.0000 mg | INTRAMUSCULAR | Status: DC | PRN

## 2021-04-19 MED ORDER — WITCH HAZEL-GLYCERIN EX PADS: 1.0000 "application " | MEDICATED_PAD | CUTANEOUS | Status: DC | PRN

## 2021-04-19 NOTE — Lactation Note (Signed)
This note was copied from a baby's chart. ?Lactation Consultation Note ? ?Patient Name: Boy Jannet Calip ?Today's Date: 04/19/2021 ?Reason for consult: Initial assessment ?Age:34 hours ? ?P1, Mother states baby has had difficulty latching, has been sleepy at the breast.  She has been hand expressing and giving baby drops.  Encouraged her to continue hand expression and spoon feeding.  LC attempted to wake baby for feeding but baby is still sleepy.  Suggest in an hour or if baby wakes before, place baby STS on mother.  Call for assistance with latching as needed. ?Feed on demand with cues.  Goal 8-12+ times per day after first 24 hrs.  Place baby STS if not cueing.  ?Mom made aware of O/P services, breastfeeding support groups, community resources, and our phone # for post-discharge questions.  ? ? ?Maternal Data ?Has patient been taught Hand Expression?: Yes ?Does the patient have breastfeeding experience prior to this delivery?: No ? ?Feeding ?Mother's Current Feeding Choice: Breast Milk ? ?LInterventions ?Interventions: Breast feeding basics reviewed;Education;LC Services brochure ? ?Consult Status ?Consult Status: Follow-up ?Date: 04/19/21 ?Follow-up type: In-patient ? ? ? ?Dahlia Byes Boschen ?04/19/2021, 11:07 AM ? ? ? ?

## 2021-04-19 NOTE — Discharge Summary (Signed)
? ?  Postpartum Discharge Summary ? ?   ?[name] ?DOB: 01/25/1985 ?MRN: [id number] ? ?Date of admission: 04/18/2021 ?Delivery date:04/19/2021  ?Delivering provider: Earlie Lou A  ?Date of discharge: 04/21/2021 ? ?Admitting diagnosis: Oligohydramnios [O41.00X0] ?Intrauterine pregnancy: [redacted]w[redacted]d    ?Secondary diagnosis:  Principal Problem: ?  SVD (spontaneous vaginal delivery) ?Active Problems: ?  Supervision of other normal pregnancy, antepartum ?  Oligohydramnios ? ?Additional problems:     ?Discharge diagnosis: Term Pregnancy Delivered                                              ?Post partum procedures: ?Augmentation: Pitocin, Cytotec, and IP Foley ?Complications: None ? ?Hospital course: Induction of Labor With Vaginal Delivery   ?36y.o. yo G2P0010 at 331w4das admitted to the hospital 04/18/2021 for induction of labor.  Indication for induction:  borderline low AFI .  Patient had an uncomplicated labor course as follows: ?Membrane Rupture Time/Date: 9:40 PM ,04/18/2021   ?Delivery Method:Vaginal, Spontaneous  ?Episiotomy: None  ?Lacerations:  Periurethral  ?Details of delivery can be found in separate delivery note.  Patient had a routine postpartum course. Patient is discharged home 04/21/21. ? ?Newborn Data: ?Birth date:04/19/2021  ?Birth time:2:41 AM  ?Gender:Female  ?Living status:Living  ?Apgars:9 ,9  ?Weight:2900 g  ? ?Magnesium Sulfate received: No ?BMZ received: No ?Rhophylac:Yes ?MMR:N/A ?T-DaP:Given prenatally ?Flu: No ?Transfusion:No ? ?Physical exam  ?Vitals:  ? 04/20/21 0500 04/20/21 1530 04/20/21 2129 04/21/21 0530  ?BP: 117/80 112/69 112/81 111/73  ?Pulse: 60 85 81 72  ?Resp: '18 16 18   ' ?Temp: 98.5 ?F (36.9 ?C) 98.4 ?F (36.9 ?C) 98.6 ?F (37 ?C)   ?TempSrc: Oral Oral Oral   ?SpO2: 99% 100% 100% 100%  ?Weight:      ?Height:      ? ?General: alert, cooperative, and no distress ?Lochia: appropriate ?Uterine Fundus: firm ?Incision: N/A ?DVT Evaluation: No evidence of DVT seen on physical  exam. ?Negative Homan's sign. ?No cords or calf tenderness. ?Labs: ?Lab Results  ?Component Value Date  ? WBC 9.0 04/18/2021  ? HGB 12.0 04/18/2021  ? HCT 35.1 (L) 04/18/2021  ? MCV 90.0 04/18/2021  ? PLT 237 04/18/2021  ? ? ?  Latest Ref Rng & Units 04/06/2021  ?  7:03 PM  ?CMP  ?Glucose 70 - 99 mg/dL 106    ?BUN 6 - 20 mg/dL 5    ?Creatinine 0.44 - 1.00 mg/dL 0.49    ?Sodium 135 - 145 mmol/L 136    ?Potassium 3.5 - 5.1 mmol/L 3.3    ?Chloride 98 - 111 mmol/L 108    ?CO2 22 - 32 mmol/L 21    ?Calcium 8.9 - 10.3 mg/dL 8.8    ?Total Protein 6.5 - 8.1 g/dL 5.5    ?Total Bilirubin 0.3 - 1.2 mg/dL 0.3    ?Alkaline Phos 38 - 126 U/L 83    ?AST 15 - 41 U/L 23    ?ALT 0 - 44 U/L 17    ? ?Edinburgh Score: ? ?  04/19/2021  ?  6:07 AM  ?EdFlavia Shipperostnatal Depression Scale Screening Tool  ?I have been able to laugh and see the funny side of things. 0  ?I have looked forward with enjoyment to things. 0  ?I have blamed myself unnecessarily when things went wrong. 1  ?I have been  anxious or worried for no good reason. 2  ?I have felt scared or panicky for no good reason. 2  ?Things have been getting on top of me. 0  ?I have been so unhappy that I have had difficulty sleeping. 0  ?I have felt sad or miserable. 1  ?I have been so unhappy that I have been crying. 1  ?The thought of harming myself has occurred to me. 0  ?Edinburgh Postnatal Depression Scale Total 7  ? ? ? ?After visit meds:  ?Allergies as of 04/21/2021   ? ?   Reactions  ? Prozac [fluoxetine Hcl] Other (See Comments)  ? Sucidial ideations and violent  ? Citrus Nausea And Vomiting  ? Oranges  ? Ibuprofen Other (See Comments)  ? Childhood reaction of high fever  ? Percocet [oxycodone-acetaminophen] Nausea Only  ? Pt can take regular tylenol but in combination with codeine products is makes her very sick to her stomach.  ? ?  ? ?  ?Medication List  ?  ? ?STOP taking these medications   ? ?calcium carbonate 500 MG chewable tablet ?Commonly known as: TUMS - dosed in mg  elemental calcium ?  ?EVENING PRIMROSE OIL PO ?  ?potassium chloride 10 MEQ tablet ?Commonly known as: KLOR-CON M ?  ? ?  ? ?TAKE these medications   ? ?acetaminophen 500 MG tablet ?Commonly known as: TYLENOL ?Take 2 tablets (1,000 mg total) by mouth every 8 (eight) hours as needed (pain). ?What changed:  ?when to take this ?reasons to take this ?  ?Breast Pump Misc ?1 Device by Does not apply route daily. ?  ?cetirizine 10 MG tablet ?Commonly known as: ZYRTEC ?Take 10 mg by mouth daily as needed for allergies. ?  ?famotidine 20 MG tablet ?Commonly known as: PEPCID ?Take 20 mg by mouth 2 (two) times daily as needed for heartburn or indigestion. ?  ?multivitamin-prenatal 27-0.8 MG Tabs tablet ?Take 1 tablet by mouth daily at 12 noon. ?  ?PROBIOTIC PO ?Take 1 capsule by mouth daily. ?  ?VITAMIN D PO ?Take 1 capsule by mouth daily. ?  ? ?  ? ? ? ?Discharge home in stable condition ?Infant Feeding: Breast ?Infant Disposition:home with mother ?Discharge instruction: per After Visit Summary and Postpartum booklet. ?Activity: Advance as tolerated. Pelvic rest for 6 weeks.  ?Diet: routine diet ?Future Appointments: ?Future Appointments  ?Date Time Provider [place]  ?05/01/2021 10:00 AM Mendelson, Dorinda Hill, PhD [facility] None  ?05/15/2021 10:00 AM Mendelson, Dorinda Hill, PhD [facility] None  ?06/06/2021 10:55 AM Leftwich-Kirby, Kathie Dike, CNM [facility] None  ?06/12/2021 10:00 AM Mendelson, Dorinda Hill, PhD [facility] None  ?06/26/2021 10:00 AM Mendelson, Dorinda Hill, PhD [facility] None  ?07/10/2021 10:00 AM Mendelson, Dorinda Hill, PhD [facility] None  ?07/24/2021 10:00 AM Mendelson, Dorinda Hill, PhD [facility] None  ?08/07/2021 10:00 AM Mendelson, Dorinda Hill, PhD [facility] None  ?08/21/2021 10:00 AM Mendelson, Dorinda Hill, PhD [facility] None  ?09/18/2021 10:00 AM Mendelson, Dorinda Hill, PhD [facility] None  ?10/02/2021 10:00 AM Mendelson, Dorinda Hill, PhD [facility] None  ?10/16/2021 10:00 AM Mendelson, Dorinda Hill, PhD [facility] None  ?10/30/2021 10:00 AM Mendelson,  Dorinda Hill, PhD [facility] None  ?11/13/2021 10:00 AM Mendelson, Dorinda Hill, PhD [facility] None  ?11/27/2021 10:00 AM Mendelson, Dorinda Hill, PhD [facility] None  ?12/11/2021 10:00 AM Mendelson, Dorinda Hill, PhD [facility] None  ? ?Follow up Visit: ?Roma Schanz, CNM  P [facility] ?Please schedule this patient for PP visit in: pp visit 4-6wks  ?Low risk  pregnancy complicated by: borderline low AFI  ?Delivery mode:  SVD  ?Anticipated Birth Control:  maybe phexxi  ?PP Procedures needed: none  ?Schedule Integrated BH visit: no  ?Provider: CNM only  ? ?04/21/2021 ?Christin Fudge, CNM ? ? ? ?

## 2021-04-19 NOTE — Anesthesia Postprocedure Evaluation (Signed)
Anesthesia Post Note ? ?Patient: Krista Lee ? ?Procedure(s) Performed: AN AD HOC LABOR EPIDURAL ? ?  ? ?Patient location during evaluation: Mother Baby ?Anesthesia Type: Epidural ?Level of consciousness: awake and alert and oriented ?Pain management: satisfactory to patient ?Vital Signs Assessment: post-procedure vital signs reviewed and stable ?Respiratory status: respiratory function stable ?Cardiovascular status: stable ?Postop Assessment: no headache, no backache, epidural receding, patient able to bend at knees, no signs of nausea or vomiting, adequate PO intake and able to ambulate ?Anesthetic complications: no ? ? ?No notable events documented. ? ?Last Vitals:  ?Vitals:  ? 04/19/21 0607 04/19/21 1039  ?BP: 119/74 (!) 107/59  ?Pulse: 81 89  ?Resp: 18 18  ?Temp: 36.9 ?C 37.3 ?C  ?SpO2: 100% 99%  ?  ?Last Pain:  ?Vitals:  ? 04/19/21 1216  ?TempSrc:   ?PainSc: 0-No pain  ? ?Pain Goal:   ? ?  ?  ?  ?  ?  ?  ?  ? ?Adler Alton ? ? ? ? ?

## 2021-04-19 NOTE — Lactation Note (Signed)
This note was copied from a baby's chart. ?Lactation Consultation Note ? ?[name] ?Today's Date: 04/19/2021 ?Reason for consult: Follow-up assessment;Mother's request;Difficult latch;Early term 37-38.6wks;Breastfeeding assistance ?Age:34 hours ? ?Infant fed 3.5 ml  of colostrum, then latched and observed feeding for 10 min.  ?Infant still latched at the end of the visit.  ? ?Maternal Data ?Has patient been taught Hand Expression?: Yes ?Does the patient have breastfeeding experience prior to this delivery?: No ? ?Feeding ?Mother's Current Feeding Choice: Breast Milk ? ?LATCH Score ?Latch: Repeated attempts needed to sustain latch, nipple held in mouth throughout feeding, stimulation needed to elicit sucking reflex. ? ?Audible Swallowing: Spontaneous and intermittent ? ?Type of Nipple: Flat ? ?Comfort (Breast/Nipple): Soft / non-tender ? ?Hold (Positioning): Assistance needed to correctly position infant at breast and maintain latch. ? ?LATCH Score: 7 ? ? ?Lactation Tools Discussed/Used ?Tools: Pump;Flanges ?Flange Size: 24 ?Breast pump type: Manual ?Pump Education: Setup, frequency, and cleaning;Milk Storage ?Reason for Pumping: elongate nipple ?Pumping frequency: pre pump 5-10 min before latching ? ?Interventions ?Interventions: Breast feeding basics reviewed;Assisted with latch;Skin to skin;Breast massage;Hand express;Breast compression;Adjust position;Support pillows;Position options;Expressed milk;Hand pump;Education;LC Magazine features editor;Infant Driven Feeding Algorithm education ? ?Discharge ?Pump: Manual ? ?Consult Status ?Consult Status: Follow-up ?Date: 04/20/21 ?Follow-up type: In-patient ? ? ? ?Giankarlo Leamer  Nicholson-Springer ?04/19/2021, 4:31 PM ? ? ? ?

## 2021-04-20 LAB — BIRTH TISSUE RECOVERY COLLECTION (PLACENTA DONATION)

## 2021-04-20 NOTE — Lactation Note (Signed)
This note was copied from a baby's chart. ?Lactation Consultation Note ?Mom having difficulty latching baby. ?Baby just wont latch, cries at the breast as if he doesn't know what to do. ?In football position able to get a deep latch. Baby fussy. Once baby finally latched, and just held nipple in mouth, LC massaged breast and compressed colostrum into baby's mouth to keep him suckling. Noted baby has labial tie and probable tight frenulum. Baby feels like he might be chomping instead of suckling d/t tight frenulum. Baby had held nipple in mouth for a while w/o suckling and nipple was compressed some. Mom stated it is painful when he clamps down at times. ?If suckling habits doesn't improve mom may need NS to protect her nipple from getting damaged. ?Encouraged to assess for transfer as well. ?Breast was being compressed so transfer noted at this time. Encouraged to call for latch assistance when needed. ? ?[name] ?Today's Date: 04/20/2021 ?Reason for consult: Mother's request;Difficult latch;Primapara;Early term 37-38.6wks ?Age:43 hours ? ?Maternal Data ?Has patient been taught Hand Expression?: Yes ?Does the patient have breastfeeding experience prior to this delivery?: No ? ?Feeding ?  ? ?LATCH Score ?Latch: Repeated attempts needed to sustain latch, nipple held in mouth throughout feeding, stimulation needed to elicit sucking reflex. ? ?Audible Swallowing: A few with stimulation ? ?Type of Nipple: Everted at rest and after stimulation (short shaft) ? ?Comfort (Breast/Nipple): Soft / non-tender ? ?Hold (Positioning): Full assist, staff holds infant at breast ? ?LATCH Score: 6 ? ? ?Lactation Tools Discussed/Used ?  ? ?Interventions ?Interventions: Breast feeding basics reviewed;Assisted with latch;Skin to skin;Breast massage;Hand express;Breast compression;Adjust position;Support pillows;Position options ? ?Discharge ?  ? ?Consult Status ?Consult Status: Follow-up ?Date: 04/20/21 ?Follow-up  type: In-patient ? ? ? ?Charyl Dancer ?04/20/2021, 1:37 AM ? ? ? ?

## 2021-04-20 NOTE — Social Work (Signed)
CSW received consult for hx of Anxiety, Depression and Panic attacks. CSW met with [name] to offer support and complete assessment.   ? ?CSW met with [name] at bedside and introduced the CSW role. CSW observed [name] holding the infant and FOB up in the room. CSW offered [name] privacy. [name] gave CSW permission to share information in front of FOB. CSW inquired how [name] has felt since giving birth. [name] reported feeling tired and that she is trying to figure out the infant's feedings with support from lactation. [name] shared she had a good labor and delivery experience. CSW inquired how [name] felt emotionally during the pregnancy. [name] disclosed that she had a pregnancy loss in the past and felt worried and had times where she found herself "weeping." [name] acknowledged that she has a history of depression, anxiety, and panic attacks. [name] reported that she was diagnosed at age 68 and is currently in therapy which she feels is helpful. [name] reported that sees [name] at [facility] and will continue to see her postpartum with the next appointment being in nine days. [name] identified her spouse, family and parents as supports. CSW discussed PPD. CSW provided education regarding the baby blues period vs. perinatal mood disorders, discussed treatment and gave resources for mental health follow up if concerns arise.  CSW recommended self-evaluation during the postpartum time period using the New Mom Checklist from Postpartum Progress and encouraged [name] to contact a medical professional if symptoms are noted at any time. CSW assessed [name] for safety. [name] denied thoughts of harm to self and others.  ? ?CSW provided review of Sudden Infant Death Syndrome (SIDS) precautions. [name] reported that she is apart of a safe sleep group and has purchased items for the infant that promotes safe sleep. [name] reported she has essential items for the infant including a bassinet where the infant will sleep. [name] has chosen [facility] for the  infant's follow up care. CSW assessed [name] for additional needs. [name] reported no further need.  ? ?CSW identifies no further need for intervention and no barriers to discharge at this time.  ? ?[name], MSW, LCSW ?[facility] [place]  ?Clinical Social Worker  ?[phone] ?04/20/2021  1:10 PM  ? ? ? ? ? ?

## 2021-04-20 NOTE — Progress Notes (Addendum)
Post Partum Day 1 ? ?Subjective: ?Patient is semi-fowlers and infant female is in spouse's arms. She complains about difficulty latching but stated she just fed him and he latched on for approximately 15-20 minutes this morning. She would like to work more on breastfeeding today. Her pain and bleeding is controlled. She is eating, drinking, ambulating, and voiding well. She reports some gas and constipation. No other concerns at this time.  ? ?Objective: ?Blood pressure 117/80, pulse 60, temperature 98.5 ?F (36.9 ?C), temperature source Oral, resp. rate 18, height 5\' 7"  (1.702 m), weight 74 kg, last menstrual period 07/23/2020, SpO2 99 %, unknown if currently breastfeeding. ? ?Physical Exam:  ?General: alert and cooperative ?Lochia: appropriate ?Uterine Fundus: firm and below umbilicus  ?Incision: N/A ?DVT Evaluation: no LE edema or calf tenderness to palpation  ? ?Recent Labs  ?  04/18/21 ?0705  ?HGB 12.0  ?HCT 35.1*  ? ? ?Assessment/Plan: ?Progressing well postpartum and meeting milestones. VSS. Continue routine postpartum care. ? ?-Lactation consult today to continue to assist with breastfeeding ?-Simethicone and stool softeners PRN  ? ?Contraception: Considering Phexxi ?Circumcision: Desires, consented at bedside this AM ? ?Dispo: Plan for discharge on PPD#2.  ? ? LOS: 2 days  ? ?04/20/21 Desir, SNM ?04/20/2021, 7:57 AM  ? ?GME ATTESTATION:  ?I saw and evaluated the patient. I agree with the findings and the plan of care as documented in the student?s note. I have made changes to documentation as necessary. I have personally performed the physical exam which is within normal limits as documented above.  ? ?04/22/2021, MD ?OB Fellow, Faculty Practice ?[place], [facility] ?04/20/2021 9:17 AM ? ?

## 2021-04-21 MED ORDER — ACETAMINOPHEN 500 MG PO TABS
1000.0000 mg | ORAL_TABLET | Freq: Three times a day (TID) | ORAL | 0 refills | Status: DC | PRN
Start: 2021-04-21 — End: 2021-10-05

## 2021-04-21 NOTE — Lactation Note (Signed)
This note was copied from a baby's chart. ?Lactation Consultation Note ? ?Patient Name: Boy Yevonne Soncrant ?Today's Date: 04/21/2021 ?Reason for consult: Follow-up assessment;Primapara;1st time breastfeeding;Early term 37-38.6wks ?Age:34 hours ? ?Lactation followed up with Ms. Gookin and her spouse. She was holding her son and offering him the right breast in football hold upon entry. She states that he was cueing. I assisted with correctly positioning him at the breast and "sandwiching" her tissue. Baby fell asleep, and we discontinued the attempt. She stated that he breast fed, and then received about 32 mls of EBM and ABM by bottle between 0800-0900.  ? ?We spoke about the progress that has been made in the last 2-3 days. One challenge is that baby tends to latch to the breast and then fall asleep quickly.  ? ?Parents are supplementing after breast feeding attempts with EBM first then ABM, by bottle. They initially began to supplement due to baby's blood sugar. ? ?Moving forward we discussed a plan ?- Breast feed first prior to supplementing. Breast feed on demand 8-12 times a day.  ?- Switch nurse to keep baby active at the breast. Gently "pester" baby at the breast. ?- Post pump if supplementing directly after the attempt; have support person supplement while mom pumps to save time. Pace bottle feed baby. ?- We discussed that this plan is a temporary measure to support her milk volume and baby's growth. I recommended that she follow up with a lactation consultant in an OP setting early next week. There is one available at her pediatrician's office, and I referred her to the [facility] OP consultant as well. ? ?I provided discharge education including engorgement care, community breast feeding resources, and reinforcement of the feeding plan. ? ?Maternal Data ?Has patient been taught Hand Expression?: Yes ?Does the patient have breastfeeding experience prior to this delivery?: No ? ?Feeding ?Mother's Current Feeding  Choice: Breast Milk and Formula ? ?LATCH Score ?Latch: Too sleepy or reluctant, no latch achieved, no sucking elicited. ? ?Audible Swallowing: None ? ?Type of Nipple: Everted at rest and after stimulation ? ?Comfort (Breast/Nipple): Soft / non-tender ? ?Hold (Positioning): No assistance needed to correctly position infant at breast. ? ?LATCH Score: 6 ? ? ?Lactation Tools Discussed/Used ?Breast pump type: Double-Electric Breast Pump ?Reason for Pumping: support milk production; supplementation ?Pumping frequency: post pumping or pumping when baby recieves supplement ? ?Interventions ?Interventions: Breast feeding basics reviewed;Education;LC Services brochure ? ?Discharge ?Discharge Education: Engorgement and breast care;Warning signs for feeding baby;Outpatient recommendation;Outpatient Epic message sent ?Pump: Personal (Spectra and Elvie (recommended Spectra)) ? ?Consult Status ?Consult Status: Complete ?Date: 04/21/21 ?Follow-up type: Out-patient ? ? ? ?Lenore Manner ?04/21/2021, 10:40 AM ? ? ? ?

## 2021-04-24 ENCOUNTER — Encounter: Payer: 59 | Admitting: Medical

## 2021-04-29 ENCOUNTER — Telehealth (HOSPITAL_COMMUNITY): Payer: Self-pay

## 2021-04-29 NOTE — Telephone Encounter (Signed)
No answer. Left message to return nurse call. ? Jerald Kief ?04/29/2021,1004 ?

## 2021-05-01 ENCOUNTER — Ambulatory Visit: Payer: 59 | Admitting: Clinical

## 2021-05-14 ENCOUNTER — Encounter: Payer: Self-pay | Admitting: Advanced Practice Midwife

## 2021-05-15 ENCOUNTER — Ambulatory Visit (INDEPENDENT_AMBULATORY_CARE_PROVIDER_SITE_OTHER): Payer: 59 | Admitting: Clinical

## 2021-05-15 ENCOUNTER — Other Ambulatory Visit: Payer: Self-pay | Admitting: Advanced Practice Midwife

## 2021-05-15 DIAGNOSIS — F411 Generalized anxiety disorder: Secondary | ICD-10-CM

## 2021-05-15 DIAGNOSIS — O9122 Nonpurulent mastitis associated with the puerperium: Secondary | ICD-10-CM

## 2021-05-15 MED ORDER — CEFADROXIL 500 MG PO CAPS: 500.0000 mg | ORAL_CAPSULE | Freq: Two times a day (BID) | ORAL | 0 refills | Status: AC

## 2021-05-15 NOTE — Progress Notes (Signed)
Diagnosis: F41.9 ?Time: 10:05am-10:59am ?CPT Code: 68341D-62 ? ?Mackenzie was seen remotely using secure video conferencing. She was in her home in [place] and the therapist was in her office at the time of the appointment. She presented holding her newborn son. Session focused on processing her experience of childbirth and her first few weeks as a new parent, as well as the impact of both on her relationship. Therapist suggested communication strategies to support a conversation with her husband about couple's therapy. She is scheduled to be seen again in 10 days. ? ?Treatment Plan ?Client Abilities/Strengths  ?Mekhia presented as insightful and motivated, with clear goals for therapy.  ?Client Treatment Preferences  ?Ajanay would like to begin with monthly appointments, and increase frequency once she has hit her insurance deductible.  ?Client Statement of Needs  ?Evyn is seeking CBT to address anxiety and relationship challenges.  ?Treatment Level  ?Monthly  ?Symptoms  ?Anxiety : vivid, persistent thoughts of worst case scenarios that are difficult to disengage from (Status: maintained). Relational Challenges: Difficulty with mutual triggering of uncomfortable emotions in marriage (Status: maintained).  ?Problems Addressed  ?New Description, New Description  ?Goals ?1. Difficulty in relationship with husband ?Objective ?Development of strategies to regulate emotional response to husband's stress ?Target Date: 2022-03-03 Frequency: Biweekly  ?Progress: 0 Modality: individual  ?Related Interventions ?Therapist will work with [name] to develop communication strategies by helping her to consider how she might respond to different situations and offering opportunities to practice, such as through written exercises and role plays ?Therapist will work with [name] to develop her communication strategies, including labeling and expressing her emotions and using "I" statements ?2. Persistent anxiety triggered by thoughts  of worse case scenarios that feel real ?Objective ?Decrease in frequency and severity of anxiety, increase in overall well bein ?Target Date: 2022-03-03 Frequency: Biweekly  ?Progress: 0 Modality: individual  ?Related Interventions ?Therapist will provide referrals for additional resources as appropriate ?Therapist will provide Malaiah with strategies to regulate her emotions, including meditation, breathing exercises, mindfulness, and self-care. ?Therapist will help Milta to identify and disengage from maladaptive thought patterns using CBT-based strategies ?Therapist will provide Kerryann with opportunities to process her experiences in session ?Diagnosis ?Axis none 300.00 (Anxiety state, unspecified) - Open - [Signifier: n/a]    ?Conditions For Discharge ?Achievement of treatment goals and objectives ?Therapist Signature ___________________________ ?Patient Signature ___________________________ ? ? ? ?[name], PhD ? ? ? ? ? ? ? ? ? ? ? ? ? ? ?[name], PhD ? ? ? ? ? ? ? ? ? ? ? ? ? ? ?[name], PhD ? ? ? ? ? ? ? ? ? ? ? ? ? ? ?[name], PhD ? ? ? ? ? ? ? ? ? ? ? ? ? ? ?[name], PhD ? ? ? ? ? ? ? ? ? ? ? ? ? ? ?[name], PhD ?

## 2021-05-25 ENCOUNTER — Ambulatory Visit (INDEPENDENT_AMBULATORY_CARE_PROVIDER_SITE_OTHER): Payer: 59 | Admitting: Clinical

## 2021-05-25 DIAGNOSIS — F411 Generalized anxiety disorder: Secondary | ICD-10-CM | POA: Diagnosis not present

## 2021-05-25 NOTE — Progress Notes (Signed)
Diagnosis: F41.9 Time: 2:05pm-3:00pm CPT Code: [id number]  Krista Lee was seen remotely using secure video conferencing. She was in her home in [place] and the therapist was in her office at the time of the appointment. She shared that she had had an emotional few days, during which she had opened up to her husband about her wish for him to go to therapy during an argument. Session focused on processing this conversation and considering next steps. She is scheduled to be seen again in two weeks.  Treatment Plan Client Abilities/Strengths  Krista Lee presented as insightful and motivated, with clear goals for therapy.  Client Treatment Preferences  Krista Lee would like to begin with monthly appointments, and increase frequency once she has hit her insurance deductible.  Client Statement of Needs  Krista Lee is seeking CBT to address anxiety and relationship challenges.  Treatment Level  Monthly  Symptoms  Anxiety : vivid, persistent thoughts of worst case scenarios that are difficult to disengage from (Status: maintained). Relational Challenges: Difficulty with mutual triggering of uncomfortable emotions in marriage (Status: maintained).  Problems Addressed  New Description, New Description  Goals 1. Difficulty in relationship with husband Objective Development of strategies to regulate emotional response to husband's stress Target Date: 2022-03-03 Frequency: Biweekly  Progress: 0 Modality: individual  Related Interventions Therapist will work with Krista Lee to develop communication strategies by helping her to consider how she might respond to different situations and offering opportunities to practice, such as through written exercises and role plays Therapist will work with Krista Lee to develop her communication strategies, including labeling and expressing her emotions and using "I" statements 2. Persistent anxiety triggered by thoughts of worse case scenarios that feel real Objective Decrease in  frequency and severity of anxiety, increase in overall well bein Target Date: 2022-03-03 Frequency: Biweekly  Progress: 0 Modality: individual  Related Interventions Therapist will provide referrals for additional resources as appropriate Therapist will provide Krista Lee with strategies to regulate her emotions, including meditation, breathing exercises, mindfulness, and self-care. Therapist will help Krista Lee to identify and disengage from maladaptive thought patterns using CBT-based strategies Therapist will provide Krista Lee with opportunities to process her experiences in session Diagnosis Axis none 300.00 (Anxiety state, unspecified) - Open - [Signifier: n/a]    Conditions For Discharge Achievement of treatment goals and objectives         Myrtie Cruise, PhD               Myrtie Cruise, PhD

## 2021-06-06 ENCOUNTER — Encounter: Payer: Self-pay | Admitting: Advanced Practice Midwife

## 2021-06-06 ENCOUNTER — Ambulatory Visit (INDEPENDENT_AMBULATORY_CARE_PROVIDER_SITE_OTHER): Payer: 59 | Admitting: Advanced Practice Midwife

## 2021-06-06 VITALS — BP 105/76 | HR 101 | Ht 66.0 in | Wt 134.6 lb

## 2021-06-06 DIAGNOSIS — Z3009 Encounter for other general counseling and advice on contraception: Secondary | ICD-10-CM

## 2021-06-06 DIAGNOSIS — R102 Pelvic and perineal pain: Secondary | ICD-10-CM

## 2021-06-06 DIAGNOSIS — O9089 Other complications of the puerperium, not elsewhere classified: Secondary | ICD-10-CM

## 2021-06-06 DIAGNOSIS — G8929 Other chronic pain: Secondary | ICD-10-CM

## 2021-06-06 DIAGNOSIS — R52 Pain, unspecified: Secondary | ICD-10-CM

## 2021-06-06 MED ORDER — PHEXXI 1.8-1-0.4 % VA GEL: 5.0000 g | VAGINAL | 11 refills | Status: DC | PRN

## 2021-06-06 NOTE — Progress Notes (Signed)
Post Partum Visit Note  Krista Lee is a 36 y.o. G42P1011 female who presents for a postpartum visit. She is 6 week postpartum following a normal spontaneous vaginal delivery.  I have fully reviewed the prenatal and intrapartum course. The delivery was at 38 gestational weeks.  Anesthesia: epidural. Postpartum course has been unremarkable. Baby is doing well. Baby is feeding by breast. Bleeding no bleeding. Bowel function is normal. Bladder function is normal. Patient is not sexually active. Contraception method is none. Postpartum depression screening: negative.   The pregnancy intention screening data noted above was reviewed. Potential methods of contraception were discussed. The patient elected to proceed with No data recorded.   Edinburgh Postnatal Depression Scale - 06/06/21 1109       Edinburgh Postnatal Depression Scale:  In the Past 7 Days   I have been able to laugh and see the funny side of things. 0    I have looked forward with enjoyment to things. 0    I have blamed myself unnecessarily when things went wrong. 1    I have been anxious or worried for no good reason. 0    I have felt scared or panicky for no good reason. 0    Things have been getting on top of me. 0    I have been so unhappy that I have had difficulty sleeping. 0    I have felt sad or miserable. 0    I have been so unhappy that I have been crying. 0    The thought of harming myself has occurred to me. 0    Edinburgh Postnatal Depression Scale Total 1             Health Maintenance Due  Topic Date Due   COVID-19 Vaccine (2 - Pfizer risk series) 11/22/2020    The following portions of the patient's history were reviewed and updated as appropriate: allergies, current medications, past family history, past medical history, past social history, past surgical history, and problem list.  Review of Systems Pertinent items noted in HPI and remainder of comprehensive ROS otherwise negative.  Objective:   BP 105/76   Pulse (!) 101   Ht 5\' 6"  (1.676 m)   Wt 134 lb 9.6 oz (61.1 kg)   LMP 07/23/2020   Breastfeeding Yes   BMI 21.73 kg/m    VS reviewed, nursing note reviewed,  Constitutional: well developed, well nourished, no distress HEENT: normocephalic CV: normal rate Pulm/chest wall: normal effort Abdomen: soft Neuro: alert and oriented x 3 Skin: warm, dry Psych: affect normal   Assessment:   1. Encounter for counseling regarding contraception --Discussed pt contraceptive plans and reviewed contraceptive methods based on pt preferences and effectiveness.  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  - Lactic Ac-Citric Ac-Pot Bitart (PHEXXI) 1.8-1-0.4 % GEL; Place 5 g vaginally as needed.  Dispense: 60 g; Refill: 11  2. Postpartum pain --Pt with chronic pelvic pain, improved with PT, now with postpartum similar pain, desires to do more pelvic floor work  - Ambulatory referral to Physical Therapy  3. Chronic pelvic pain in female  - Ambulatory referral to Physical Therapy  4. Postpartum care and examination --Pt doing well, bonding well with baby, breastfeeding going well now, good support at home   Plan:   Essential components of care per ACOG recommendations:  1.  Mood and well being: Patient with negative depression screening today. Reviewed local resources for  support.  - Patient tobacco use? No.   - hx of drug use? No.    2. Infant care and feeding:  -Patient currently breastmilk feeding? Yes. Reviewed importance of draining breast regularly to support lactation.  -Social determinants of health (SDOH) reviewed in EPIC. No concerns    3. Sexuality, contraception and birth spacing - Patient does not want a pregnancy in the next year.   - Reviewed reproductive life planning. Reviewed contraceptive methods based on pt preferences and effectiveness.  Patient desired Withdrawal or Other Method today.   - Discussed birth spacing of  18 months  4. Sleep and fatigue -Encouraged family/partner/community support of 4 hrs of uninterrupted sleep to help with mood and fatigue  5. Physical Recovery  - Discussed patients delivery and complications. She describes her labor as good. - Patient had a Vaginal, no problems at delivery. Patient had no laceration. Perineal healing reviewed. Patient expressed understanding - Patient has urinary incontinence? No. - Patient is safe to resume physical and sexual activity  6.  Health Maintenance - HM due items addressed Yes - Last pap smear  Diagnosis  Date Value Ref Range Status  01/12/2019   Final   - Negative for intraepithelial lesion or malignancy (NILM)   Pap smear not done at today's visit.  -Breast Cancer screening indicated? No.   7. Chronic Disease/Pregnancy Condition follow up: None  - PCP follow up  Sharen Counter, CNM [facility] [employer]

## 2021-06-12 ENCOUNTER — Ambulatory Visit (INDEPENDENT_AMBULATORY_CARE_PROVIDER_SITE_OTHER): Payer: 59 | Admitting: Clinical

## 2021-06-12 DIAGNOSIS — F411 Generalized anxiety disorder: Secondary | ICD-10-CM

## 2021-06-12 NOTE — Progress Notes (Signed)
Diagnosis: F41.1 Time: 10:03am-10:57am CPT Code: [id number]  Krista Lee was seen remotely using secure video conferencing. She was in her home in [place] and the therapist was in her office at the time of the appointment. She reflected upon some positive developments in her relationship since her last session, and therapist worked with her to consider communication strategies with her husband. She is scheduled to be seen again in two weeks.  Treatment Plan Client Abilities/Strengths  Female presented as insightful and motivated, with clear goals for therapy.  Client Treatment Preferences  [name] would like to begin with monthly appointments, and increase frequency once she has hit her insurance deductible.  Client Statement of Needs  [name] is seeking CBT to address anxiety and relationship challenges.  Treatment Level  Monthly  Symptoms  Anxiety : vivid, persistent thoughts of worst case scenarios that are difficult to disengage from (Status: maintained). Relational Challenges: Difficulty with mutual triggering of uncomfortable emotions in marriage (Status: maintained).  Problems Addressed  New Description, New Description  Goals 1. Difficulty in relationship with husband Objective Development of strategies to regulate emotional response to husband's stress Target Date: 2022-03-03 Frequency: Biweekly  Progress: 0 Modality: individual  Related Interventions Therapist will work with [name] to develop communication strategies by helping her to consider how she might respond to different situations and offering opportunities to practice, such as through written exercises and role plays Therapist will work with [name] to develop her communication strategies, including labeling and expressing her emotions and using "I" statements 2. Persistent anxiety triggered by thoughts of worse case scenarios that feel real Objective Decrease in frequency and severity of anxiety, increase in overall well  bein Target Date: 2022-03-03 Frequency: Biweekly  Progress: 0 Modality: individual  Related Interventions Therapist will provide referrals for additional resources as appropriate Therapist will provide [name] with strategies to regulate her emotions, including meditation, breathing exercises, mindfulness, and self-care. Therapist will help [name] to identify and disengage from maladaptive thought patterns using CBT-based strategies Therapist will provide [name] with opportunities to process her experiences in session Diagnosis Axis none 300.00 (Anxiety state, unspecified) - Open - [Signifier: n/a]    Conditions For Discharge Achievement of treatment goals and objectives         [name], PhD

## 2021-06-26 ENCOUNTER — Ambulatory Visit (INDEPENDENT_AMBULATORY_CARE_PROVIDER_SITE_OTHER): Payer: 59 | Admitting: Clinical

## 2021-06-26 DIAGNOSIS — F411 Generalized anxiety disorder: Secondary | ICD-10-CM

## 2021-06-26 NOTE — Progress Notes (Signed)
Diagnosis: F41.1 Time: 10:02am-10:59am CPT Code: [id number]  Krista Lee was seen remotely using secure video conferencing. She was in her home in [place] and the therapist was in her office at the time of the appointment. She reported an increase in anxiety related to her plan to return to work in the next few days. Therapist processed this with her, and suggested that the next time her mind gets into an anxious loop, she try timing it to see how long it passes. She is scheduled to be seen again in two weeks.  Treatment Plan Client Abilities/Strengths  [name] presented as insightful and motivated, with clear goals for therapy.  Client Treatment Preferences  [name] would like to begin with monthly appointments, and increase frequency once she has hit her insurance deductible.  Client Statement of Needs  [name] is seeking CBT to address anxiety and relationship challenges.  Treatment Level  Monthly  Symptoms  Anxiety : vivid, persistent thoughts of worst case scenarios that are difficult to disengage from (Status: maintained). Relational Challenges: Difficulty with mutual triggering of uncomfortable emotions in marriage (Status: maintained).  Problems Addressed  New Description, New Description  Goals 1. Difficulty in relationship with husband Objective Development of strategies to regulate emotional response to husband's stress Target Date: 2022-03-03 Frequency: Biweekly  Progress: 0 Modality: individual  Related Interventions Therapist will work with [name] to develop communication strategies by helping her to consider how she might respond to different situations and offering opportunities to practice, such as through written exercises and role plays Therapist will work with [name] to develop her communication strategies, including labeling and expressing her emotions and using "I" statements 2. Persistent anxiety triggered by thoughts of worse case scenarios that feel  real Objective Decrease in frequency and severity of anxiety, increase in overall well bein Target Date: 2022-03-03 Frequency: Biweekly  Progress: 0 Modality: individual  Related Interventions Therapist will provide referrals for additional resources as appropriate Therapist will provide [name] with strategies to regulate her emotions, including meditation, breathing exercises, mindfulness, and self-care. Therapist will help [name] to identify and disengage from maladaptive thought patterns using CBT-based strategies Therapist will provide [name] with opportunities to process her experiences in session Diagnosis Axis none 300.00 (Anxiety state, unspecified) - Open - [Signifier: n/a]    Conditions For Discharge Achievement of treatment goals and objectives         [name], PhD               [name], PhD

## 2021-07-06 ENCOUNTER — Encounter: Payer: Self-pay | Admitting: Advanced Practice Midwife

## 2021-07-10 ENCOUNTER — Ambulatory Visit: Payer: 59 | Attending: Advanced Practice Midwife | Admitting: Physical Therapy

## 2021-07-10 ENCOUNTER — Other Ambulatory Visit: Payer: Self-pay

## 2021-07-10 ENCOUNTER — Ambulatory Visit (INDEPENDENT_AMBULATORY_CARE_PROVIDER_SITE_OTHER): Payer: 59 | Admitting: Clinical

## 2021-07-10 ENCOUNTER — Encounter: Payer: Self-pay | Admitting: Physical Therapy

## 2021-07-10 DIAGNOSIS — M6281 Muscle weakness (generalized): Secondary | ICD-10-CM

## 2021-07-10 DIAGNOSIS — F411 Generalized anxiety disorder: Secondary | ICD-10-CM

## 2021-07-10 DIAGNOSIS — G8929 Other chronic pain: Secondary | ICD-10-CM | POA: Diagnosis not present

## 2021-07-10 DIAGNOSIS — R252 Cramp and spasm: Secondary | ICD-10-CM

## 2021-07-10 DIAGNOSIS — R102 Pelvic and perineal pain: Secondary | ICD-10-CM | POA: Diagnosis not present

## 2021-07-10 DIAGNOSIS — O9089 Other complications of the puerperium, not elsewhere classified: Secondary | ICD-10-CM | POA: Diagnosis not present

## 2021-07-10 NOTE — Progress Notes (Signed)
Diagnosis: F41.1 Time: 10:02am-10:59am CPT Code: [id number]  Saide was seen remotely using secure video conferencing. She was in her home in [place] and the therapist was in her office at the time of the appointment. Session focused on processing anxiety she has been experiencing regarding her relationship. 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 Therapist also suggested stepping away from emotional moments and using a coping strategy to regulate. She is scheduled to be seen again in two weeks.  Treatment Plan Client Abilities/Strengths  Jadaya presented as insightful and motivated, with clear goals for therapy.  Client Treatment Preferences  Destynie would like to begin with monthly appointments, and increase frequency once she has hit her insurance deductible.  Client Statement of Needs  Aeriana is seeking CBT to address anxiety and relationship challenges.  Treatment Level  Monthly  Symptoms  Anxiety : vivid, persistent thoughts of worst case scenarios that are difficult to disengage from (Status: maintained). Relational Challenges: Difficulty with mutual triggering of uncomfortable emotions in marriage (Status: maintained).  Problems Addressed  New Description, New Description  Goals 1. Difficulty in relationship with husband Objective Development of strategies to regulate emotional response to husband's stress Target Date: 2022-03-03 Frequency: Biweekly  Progress: 0 Modality: individual  Related Interventions Therapist will work with [name] to develop communication strategies by helping her to consider how she might respond to different situations and offering opportunities to practice, such as through written exercises and role plays Therapist will work with [name] to develop her communication strategies, including labeling and expressing her  emotions and using "I" statements 2. Persistent anxiety triggered by thoughts of worse case scenarios that feel real Objective Decrease in frequency and severity of anxiety, increase in overall well bein Target Date: 2022-03-03 Frequency: Biweekly  Progress: 0 Modality: individual  Related Interventions Therapist will provide referrals for additional resources as appropriate Therapist will provide Psalm with strategies to regulate her emotions, including meditation, breathing exercises, mindfulness, and self-care. Therapist will help Kimm to identify and disengage from maladaptive thought patterns using CBT-based strategies Therapist will provide Jami with opportunities to process her experiences in session Diagnosis Axis none 300.00 (Anxiety state, unspecified) - Open - [Signifier: n/a]    Conditions For Discharge Achievement of treatment goals and objectives        [name], PhD               [name], PhD

## 2021-07-10 NOTE — Therapy (Signed)
OUTPATIENT PHYSICAL THERAPY FEMALE PELVIC EVALUATION   [name] MRN: [id number] DOB:1985-07-11, 36 y.o., female Today's Date: 07/10/2021   PT End of Session - 07/10/21 1514     Visit Number 1    Date for PT Re-Evaluation 10/02/21    Authorization Type friday    PT Start Time 1448    PT Stop Time 1545    PT Time Calculation (min) 57 min    Activity Tolerance Patient tolerated treatment well    Behavior During Therapy WFL for tasks assessed/performed             Past Medical History:  Diagnosis Date   ADD (attention deficit disorder)    Allergy    no meds   Anemia    as adolescent   Anxiety    Depression    GERD (gastroesophageal reflux disease)    doesn't take any meds for this, diet controlled   Headache(784.0)    Heart murmur    as a child - no problems   History of bronchitis    as a teenager, no problems as adult   History of migraine    can't remember when the last one was   Insomnia    no meds   Panic attacks    Symptomatic cholelithiasis 09/25/2011   UTI (urinary tract infection)    Past Surgical History:  Procedure Laterality Date   CHOLECYSTECTOMY  10/22/2011   Procedure: LAPAROSCOPIC CHOLECYSTECTOMY;  Surgeon: Harl Bowie, MD;  Location: [place];  Service: General;  Laterality: N/A;   DILATION AND EVACUATION N/A 12/07/2019   Procedure: DILATATION AND EVACUATION;  Surgeon: Mora Bellman, MD;  Location: [place];  Service: Gynecology;  Laterality: N/A;  NEEDS ULTRASOUND GUIDANCE ANORA KIT SENT    HYSTEROSCOPY WITH RESECTOSCOPE N/A 04/28/2013   Procedure:  HYSTEROSCOPY ;  Surgeon: Princess Bruins, MD;  Location: [facility];  Service: Gynecology;  Laterality: N/A;   LAPAROSCOPY N/A 04/28/2013   Procedure: LAPAROSCOPY DIAGNOSTIC;  Surgeon: Princess Bruins, MD;  Location: [facility];  Service: Gynecology;  Laterality: N/A;  2 hrs. total   OPERATIVE ULTRASOUND N/A 12/07/2019   Procedure: OPERATIVE ULTRASOUND;  Surgeon:  Mora Bellman, MD;  Location: [place];  Service: Gynecology;  Laterality: N/A;   WISDOM TOOTH EXTRACTION  18yr ago   Patient Active Problem List   Diagnosis Date Noted   SVD (spontaneous vaginal delivery) 04/19/2021   Hypokalemia 04/06/2021   Ehlers-Danlos syndrome 02/06/2021   Missed abortion    Female infertility 08/17/2019    PCP: No pcp  REFERRING PROVIDER: LElvera Maria CNM  REFERRING DIAG: [id number](ICD-10-CM) - Postpartum pain; R10.2,G89.29 (ICD-10-CM) - Chronic pelvic pain in female  THERAPY DIAG:  Cramp and spasm  Muscle weakness (generalized)  Rationale for Evaluation and Treatment Rehabilitation  ONSET DATE: 4-6 weeks ago  SUBJECTIVE:  SUBJECTIVE STATEMENT: I have new low back pain and the other pain was there before which was jaw and pelvic pain.  Stress causing more clenching but other than that it just happens.  Breast feeding. I had low abdominal pain when walking after 3 lb.  I did weight lifting and core strength but that was causing more back pain.  Fluid intake: water    PAIN:  Are you having pain? Yes NPRS scale: 2-3/10 Pain location:  low thoracic - mid  Pain type: aching and dull Pain description: intermittent   Aggravating factors: strengthening Relieving factors: not very irritable  PRECAUTIONS: None  WEIGHT BEARING RESTRICTIONS No  FALLS:  Has patient fallen in last 6 months? No  LIVING ENVIRONMENT: Lives with: lives with their family, lives with their spouse, and infant Lives in: House/apartment  OCCUPATION: hair dresser  PLOF: Independent  PATIENT GOALS back and being able to strengthen and stop from having muscle spasms in jaw and pelvic floor  PERTINENT HISTORY:  Had a couple of falls - one severe with concussion  when 36 y/o Sexual abuse: No  BOWEL MOVEMENT Pain with bowel movement: No Type of bowel movement:Type (Bristol Stool Scale) small and hard stool, Frequency 1-3 days, and Strain No Fully empty rectum: Yes: mostly Leakage: No   URINATION Pain with urination: No Fully empty bladder: Yes:   Stream: Strong Urgency: No Frequency: can hold it for a while so I try to go more frequently Leakage:  no   INTERCOURSE Pain with intercourse:  have in the past - not lately but just beginning after having a baby Ability to have vaginal penetration:  Yes: but haven't tried much or in many different positions Climax: unable to fully Marinoff Scale: unknown/3  PREGNANCY Vaginal deliveries 1 Tearing No   PROLAPSE None    OBJECTIVE:   DIAGNOSTIC FINDINGS:    PATIENT SURVEYS:     COGNITION:  Overall cognitive status: Within functional limits for tasks assessed     MUSCLE LENGTH: Hamstrings: Right wfl deg; Left wfl deg   LUMBAR SPECIAL TESTS:  fwd  FUNCTIONAL TESTS:  Single leg stand - rt side more medial collapse in arch  GAIT: Comments: WFL               POSTURE: forward head, increased lumbar lordosis, and increased thoracic kyphosis   PELVIC ALIGNMENT:  LUMBARAROM/PROM WFL for all below A/PROM A/PROM  eval  Flexion   Extension   Right lateral flexion   Left lateral flexion   Right rotation   Left rotation    (Blank rows = not tested)  LOWER EXTREMITY ROM: AROM jaw open/close and lateral glides - WFL no pain Passive ROM Right eval Left eval  Hip flexion 90% 100%  Hip extension    Hip abduction    Hip adduction    Hip internal rotation    Hip external rotation    Knee flexion    Knee extension    Ankle dorsiflexion    Ankle plantarflexion    Ankle inversion    Ankle eversion     (Blank rows = not tested)  LOWER EXTREMITY MMT:  MMT Right eval Left eval  Hip flexion    Hip extension    Hip abduction    Hip adduction    Hip internal  rotation    Hip external rotation    Knee flexion    Knee extension    Ankle dorsiflexion    Ankle plantarflexion    Ankle  inversion    Ankle eversion      PALPATION:   General  lumbar and cervical paraspinals tight; suboccipitals tight, restriction around right lower quadrant ovary and cecum, masseter tight to palpation                External Perineal Exam normal                             Internal Pelvic Floor Tender to coccyeus and levators on rt side; obdurator internus on the left side  Patient confirms identification and approves PT to assess internal pelvic floor and treatment Yes No emotional/communication barriers or cognitive limitation. Patient is motivated to learn. Patient understands and agrees with treatment goals and plan. PT explains patient will be examined in standing, sitting, and lying down to see how their muscles and joints work. When they are ready, they will be asked to remove their underwear so PT can examine their perineum. The patient is also given the option of providing their own chaperone as one is not provided in our facility. The patient also has the right and is explained the right to defer or refuse any part of the evaluation or treatment including the internal exam. With the patient's consent, PT will use one gloved finger to gently assess the muscles of the pelvic floor, seeing how well it contracts and relaxes and if there is muscle symmetry. After, the patient will get dressed and PT and patient will discuss exam findings and plan of care. PT and patient discuss plan of care, schedule, attendance policy and HEP activities.  PELVIC MMT:   MMT eval  Vaginal 2/5 x 3 sec ; 4 reps  Internal Anal Sphincter   External Anal Sphincter   Puborectalis   Diastasis Recti no  (Blank rows = not tested)        TONE: Higher with difficulty relaxing after one contraction  PROLAPSE: no  TODAY'S TREATMENT     PATIENT EDUCATION:  Education details: Access  Code: [email or web address] Person educated: Patient Education method: Explanation, Demonstration, Tactile cues, Verbal cues, and Handouts Education comprehension: verbalized understanding and returned demonstration   HOME EXERCISE PROGRAM: Access Code: [email or web address] URL: [email or web address] Date: 07/10/2021 Prepared by: [place]  - 1 x daily - 7 x weekly - 3 sets - 10 reps - Child's Pose Side Bend  - 1 x daily - 7 x weekly - 1 sets - 6 reps - 30 sec hold - Supine Upper Trunk and Cervical Rotation with Opposite Lower Trunk Rotation  - 1 x daily - 7 x weekly - 1 sets - 10 reps - 5 hold - Supine Butterfly Groin Stretch  - 1 x daily - 7 x weekly - 1 sets - 3 reps - 30 sec hold  ASSESSMENT:  CLINICAL IMPRESSION: Patient is a 36 y.o. female who was seen today for physical therapy evaluation and treatment for pelvic and back pain that was exacerbated with recent vaginal delivery. Pt has impairments that include soft tissue restriction around right lower quadrant, high tone pelvic floor with pelvic floor weakness.  Pt has chronic pain and tight neck and jaw that also corresponds to her pelvic floor tension.  Pt has difficulty relaxing and coordination of pelvic floor when attempting to do quick flicks and to contract and hold with low endurance.  Pt has high resting tone for 10 seconds after doing a single contraction.  There is also  some slight bulging with abdominal bracing.  Pt will benefit from skilled PT to address impairments and restore maximum function.   OBJECTIVE IMPAIRMENTS decreased coordination, decreased endurance, decreased ROM, decreased strength, increased fascial restrictions, increased muscle spasms, impaired tone, postural dysfunction, and pain.   ACTIVITY LIMITATIONS  exercise  PARTICIPATION LIMITATIONS: interpersonal relationship  PERSONAL FACTORS 1-2 comorbidities: history of fall on back and concussion; vaginal delivery  are also affecting  patient's functional outcome.   REHAB POTENTIAL: Excellent  CLINICAL DECISION MAKING: Stable/uncomplicated  EVALUATION COMPLEXITY: Low   GOALS: Goals reviewed with patient? Yes  SHORT TERM GOALS: Target date: 08/07/2021  Ind with initial HEP Baseline: Goal status: INITIAL    LONG TERM GOALS: Target date: 10/02/2021   Pt will be independent with advanced HEP to maintain improvements made throughout therapy  Baseline:  Goal status: INITIAL  2.  Pt will report 80% reduction of pain and muscle spasms during typical day due to improvements in posture, strength, and muscle length  Baseline:  Goal status: INITIAL  3.  Pt will have 0/3 score of Marinoff scale at least 75% of the time  Baseline:  Goal status: INITIAL  4.  Pt will be able to return to normal gym and core workout without increased back pain due to improved deep core strength Baseline:  Goal status: INITIAL   PLAN: PT FREQUENCY: 1x/week  PT DURATION: 12 weeks  PLANNED INTERVENTIONS: Therapeutic exercises, Therapeutic activity, Neuromuscular re-education, Balance training, Gait training, Patient/Family education, Joint mobilization, Dry Needling, Electrical stimulation, Cryotherapy, Moist heat, Taping, Biofeedback, and Manual therapy  PLAN FOR NEXT SESSION: dry needling or STM to jaw and cervical paraspinals, transversus abdominus activation, lower right quadrant fascial release   Jule Ser, PT 07/10/2021, 4:39 PM

## 2021-07-24 ENCOUNTER — Ambulatory Visit (INDEPENDENT_AMBULATORY_CARE_PROVIDER_SITE_OTHER): Payer: 59 | Admitting: Clinical

## 2021-07-24 ENCOUNTER — Ambulatory Visit: Payer: 59 | Admitting: Physical Therapy

## 2021-07-24 ENCOUNTER — Encounter: Payer: Self-pay | Admitting: Physical Therapy

## 2021-07-24 DIAGNOSIS — R252 Cramp and spasm: Secondary | ICD-10-CM

## 2021-07-24 DIAGNOSIS — M25651 Stiffness of right hip, not elsewhere classified: Secondary | ICD-10-CM

## 2021-07-24 DIAGNOSIS — F411 Generalized anxiety disorder: Secondary | ICD-10-CM

## 2021-07-24 DIAGNOSIS — M6281 Muscle weakness (generalized): Secondary | ICD-10-CM

## 2021-07-24 NOTE — Therapy (Signed)
OUTPATIENT PHYSICAL THERAPY FEMALE PELVIC EVALUATION   [name] MRN: [id number] DOB:04/06/1985, 36 y.o., female Today's Date: 07/24/2021   PT End of Session - 07/24/21 1524     Visit Number 2    Date for PT Re-Evaluation 10/02/21    Authorization Type friday    PT Start Time 1522    PT Stop Time 1617    PT Time Calculation (min) 55 min    Activity Tolerance Patient tolerated treatment well    Behavior During Therapy WFL for tasks assessed/performed              Past Medical History:  Diagnosis Date   ADD (attention deficit disorder)    Allergy    no meds   Anemia    as adolescent   Anxiety    Depression    GERD (gastroesophageal reflux disease)    doesn't take any meds for this, diet controlled   Headache(784.0)    Heart murmur    as a child - no problems   History of bronchitis    as a teenager, no problems as adult   History of migraine    can't remember when the last one was   Insomnia    no meds   Panic attacks    Symptomatic cholelithiasis 09/25/2011   UTI (urinary tract infection)    Past Surgical History:  Procedure Laterality Date   CHOLECYSTECTOMY  10/22/2011   Procedure: LAPAROSCOPIC CHOLECYSTECTOMY;  Surgeon: Harl Bowie, MD;  Location: [place];  Service: General;  Laterality: N/A;   DILATION AND EVACUATION N/A 12/07/2019   Procedure: DILATATION AND EVACUATION;  Surgeon: Mora Bellman, MD;  Location: [place];  Service: Gynecology;  Laterality: N/A;  NEEDS ULTRASOUND GUIDANCE ANORA KIT SENT    HYSTEROSCOPY WITH RESECTOSCOPE N/A 04/28/2013   Procedure:  HYSTEROSCOPY ;  Surgeon: Princess Bruins, MD;  Location: [facility];  Service: Gynecology;  Laterality: N/A;   LAPAROSCOPY N/A 04/28/2013   Procedure: LAPAROSCOPY DIAGNOSTIC;  Surgeon: Princess Bruins, MD;  Location: [facility];  Service: Gynecology;  Laterality: N/A;  2 hrs. total   OPERATIVE ULTRASOUND N/A 12/07/2019   Procedure: OPERATIVE ULTRASOUND;   Surgeon: Mora Bellman, MD;  Location: [place];  Service: Gynecology;  Laterality: N/A;   WISDOM TOOTH EXTRACTION  23yr ago   Patient Active Problem List   Diagnosis Date Noted   SVD (spontaneous vaginal delivery) 04/19/2021   Hypokalemia 04/06/2021   Ehlers-Danlos syndrome 02/06/2021   Missed abortion    Female infertility 08/17/2019    PCP: No pcp  REFERRING PROVIDER: LElvera Maria CNM  REFERRING DIAG: [id number](ICD-10-CM) - Postpartum pain; R10.2,G89.29 (ICD-10-CM) - Chronic pelvic pain in female  THERAPY DIAG:  Cramp and spasm  Muscle weakness (generalized)  Stiffness of right hip, not elsewhere classified  Rationale for Evaluation and Treatment Rehabilitation  ONSET DATE: 4-6 weeks ago  SUBJECTIVE:  SUBJECTIVE STATEMENT: I feel a lot of tension/tingling in mid back.  I have tension in the lateral hips.  I had some pain in the right foot as well that I noticed this week. Fluid intake: water    PAIN:  Are you having pain? Yes (not quite pain, just a tingling like it is going to sleep) NPRS scale: 6/10 Pain location:  low thoracic - mid  Pain type: tingling Pain description: intermittent   Aggravating factors: not sure; been more frequent since working Relieving factors: not very irritable  PRECAUTIONS: None  WEIGHT BEARING RESTRICTIONS No  FALLS:  Has patient fallen in last 6 months? No  LIVING ENVIRONMENT: Lives with: lives with their family, lives with their spouse, and infant Lives in: House/apartment  OCCUPATION: hair dresser  PLOF: Independent  PATIENT GOALS back and being able to strengthen and stop from having muscle spasms in jaw and pelvic floor  PERTINENT HISTORY:  Had a couple of falls - one severe with concussion when 36  y/o Sexual abuse: No  BOWEL MOVEMENT Pain with bowel movement: No Type of bowel movement:Type (Bristol Stool Scale) small and hard stool, Frequency 1-3 days, and Strain No Fully empty rectum: Yes: mostly Leakage: No   URINATION Pain with urination: No Fully empty bladder: Yes:   Stream: Strong Urgency: No Frequency: can hold it for a while so I try to go more frequently Leakage:  no   INTERCOURSE Pain with intercourse:  have in the past - not lately but just beginning after having a baby Ability to have vaginal penetration:  Yes: but haven't tried much or in many different positions Climax: unable to fully Marinoff Scale: unknown/3  PREGNANCY Vaginal deliveries 1 Tearing No   PROLAPSE None    OBJECTIVE:   DIAGNOSTIC FINDINGS:    PATIENT SURVEYS:     COGNITION:  Overall cognitive status: Within functional limits for tasks assessed     MUSCLE LENGTH: Hamstrings: Right wfl deg; Left wfl deg   LUMBAR SPECIAL TESTS:  fwd  FUNCTIONAL TESTS:  Single leg stand - rt side more medial collapse in arch  GAIT: Comments: WFL               POSTURE: forward head, increased lumbar lordosis, and increased thoracic kyphosis   PELVIC ALIGNMENT:  LUMBARAROM/PROM WFL for all below A/PROM A/PROM  eval  Flexion   Extension   Right lateral flexion   Left lateral flexion   Right rotation   Left rotation    (Blank rows = not tested)  LOWER EXTREMITY ROM: AROM jaw open/close and lateral glides - WFL no pain Passive ROM Right eval Left eval  Hip flexion 90% 100%  Hip extension    Hip abduction    Hip adduction    Hip internal rotation    Hip external rotation    Knee flexion    Knee extension    Ankle dorsiflexion    Ankle plantarflexion    Ankle inversion    Ankle eversion     (Blank rows = not tested)  LOWER EXTREMITY MMT:  MMT Right eval Left eval  Hip flexion    Hip extension    Hip abduction    Hip adduction    Hip internal rotation     Hip external rotation    Knee flexion    Knee extension    Ankle dorsiflexion    Ankle plantarflexion    Ankle inversion    Ankle eversion  PALPATION:   General  lumbar and cervical paraspinals tight; suboccipitals tight, restriction around right lower quadrant ovary and cecum, masseter tight to palpation                External Perineal Exam normal                             Internal Pelvic Floor Tender to coccyeus and levators on rt side; obdurator internus on the left side  Patient confirms identification and approves PT to assess internal pelvic floor and treatment Yes No emotional/communication barriers or cognitive limitation. Patient is motivated to learn. Patient understands and agrees with treatment goals and plan. PT explains patient will be examined in standing, sitting, and lying down to see how their muscles and joints work. When they are ready, they will be asked to remove their underwear so PT can examine their perineum. The patient is also given the option of providing their own chaperone as one is not provided in our facility. The patient also has the right and is explained the right to defer or refuse any part of the evaluation or treatment including the internal exam. With the patient's consent, PT will use one gloved finger to gently assess the muscles of the pelvic floor, seeing how well it contracts and relaxes and if there is muscle symmetry. After, the patient will get dressed and PT and patient will discuss exam findings and plan of care. PT and patient discuss plan of care, schedule, attendance policy and HEP activities.  PELVIC MMT:   MMT eval  Vaginal 2/5 x 3 sec ; 4 reps  Internal Anal Sphincter   External Anal Sphincter   Puborectalis   Diastasis Recti no  (Blank rows = not tested)        TONE: Higher with difficulty relaxing after one contraction  PROLAPSE: no  TODAY'S TREATMENT  Treatment: 07/24/21 Exercises  Bird dog Supine heel  slide SLR 90-90 toe taps  Manual  Thoracic paraspinals elongation; cervical paraspinals, suboccipitlas; masseter and sublingual muscles Trigger Point Dry-Needling  Treatment instructions: Expect mild to moderate muscle soreness. S/S of pneumothorax if dry needled over a lung field, and to seek immediate medical attention should they occur. Patient verbalized understanding of these instructions and education.  Patient Consent Given: Yes Education handout provided: Yes Muscles treated: cervical and thoracic multifidi Electrical stimulation performed: No Parameters: N/A Treatment response/outcome: increased soft tissue length  Nuero Re-ed Transversus abdominus activation   PATIENT EDUCATION:  Education details: Access Code: [email or web address] Person educated: Patient Education method: Consulting civil engineer, Demonstration, Corporate treasurer cues, Verbal cues, and Handouts Education comprehension: verbalized understanding and returned demonstration   HOME EXERCISE PROGRAM: Access Code: [email or web address] URL: [email or web address] Date: 07/24/2021 Prepared by: [place]  - 1 x daily - 7 x weekly - 3 sets - 10 reps - Child's Pose Side Bend  - 1 x daily - 7 x weekly - 1 sets - 6 reps - 30 sec hold - Supine Upper Trunk and Cervical Rotation with Opposite Lower Trunk Rotation  - 1 x daily - 7 x weekly - 1 sets - 10 reps - 5 hold - Supine Butterfly Groin Stretch  - 1 x daily - 7 x weekly - 1 sets - 3 reps - 30 sec hold - Bird Dog  - 1 x daily - 7 x weekly - 3 sets - 10 reps - Supine 90/90 Alternating Heel Touches with Posterior Pelvic  Tilt  - 1 x daily - 7 x weekly - 3 sets - 10 reps - Supine 90/90 Shoulder Flexion with Abdominal Bracing  - 1 x daily - 7 x weekly - 3 sets - 10 reps  Patient Education - Trigger Point Dry Needling   ASSESSMENT:  CLINICAL IMPRESSION: Patient is here for initial follow up. She did well with soft tissue elongation using manual STM and dry needling  techniques with skilled Palpation of muscles.  Pt was given core exercises with transversus abdominus activation and added to HEP to continue to work on core stability and reduce overuse of back muscles.  Pt will benefit from skilled PT to address posture and strength.   OBJECTIVE IMPAIRMENTS decreased coordination, decreased endurance, decreased ROM, decreased strength, increased fascial restrictions, increased muscle spasms, impaired tone, postural dysfunction, and pain.   ACTIVITY LIMITATIONS  exercise  PARTICIPATION LIMITATIONS: interpersonal relationship  PERSONAL FACTORS 1-2 comorbidities: history of fall on back and concussion; vaginal delivery  are also affecting patient's functional outcome.   REHAB POTENTIAL: Excellent  CLINICAL DECISION MAKING: Stable/uncomplicated  EVALUATION COMPLEXITY: Low   GOALS: Goals reviewed with patient? Yes  SHORT TERM GOALS: Target date: 08/07/2021  Ind with initial HEP Baseline: Goal status: met 07/24/21     LONG TERM GOALS: Target date: 10/02/2021   Pt will be independent with advanced HEP to maintain improvements made throughout therapy  Baseline:  Goal status: INITIAL  2.  Pt will report 80% reduction of pain and muscle spasms during typical day due to improvements in posture, strength, and muscle length  Baseline:  Goal status: INITIAL  3.  Pt will have 0/3 score of Marinoff scale at least 75% of the time  Baseline:  Goal status: INITIAL  4.  Pt will be able to return to normal gym and core workout without increased back pain due to improved deep core strength Baseline:  Goal status: INITIAL   PLAN: PT FREQUENCY: 1x/week  PT DURATION: 12 weeks  PLANNED INTERVENTIONS: Therapeutic exercises, Therapeutic activity, Neuromuscular re-education, Balance training, Gait training, Patient/Family education, Joint mobilization, Dry Needling, Electrical stimulation, Cryotherapy, Moist heat, Taping, Biofeedback, and Manual  therapy  PLAN FOR NEXT SESSION: ransversus abdominus activation, pelvic floor isolation making sure she is fully releaxed btw reps   [employer], PT 07/24/2021, 4:41 PM

## 2021-07-24 NOTE — Progress Notes (Signed)
Diagnosis: F41.1 Time: 10:02am-10:59am CPT Code: [id number]  Krista Lee was seen remotely using secure video conferencing. She was in her home in [place] and the therapist was in her hom at the time of the appointment. Session focused on processing the trajectory of work-related anxiety, as well as unpacking factors from her childhood that may have contributed. For homework, she will continue to reflect upon this, with the plan to discuss in her next session. She is scheduled to be seen again in two weeks.    Treatment Plan Client Abilities/Strengths  Krista Lee presented as insightful and motivated, with clear goals for therapy.  Client Treatment Preferences  Krista Lee would like to begin with monthly appointments, and increase frequency once she has hit her insurance deductible.  Client Statement of Needs  Krista Lee is seeking CBT to address anxiety and relationship challenges.  Treatment Level  Monthly  Symptoms  Anxiety : vivid, persistent thoughts of worst case scenarios that are difficult to disengage from (Status: maintained). Relational Challenges: Difficulty with mutual triggering of uncomfortable emotions in marriage (Status: maintained).  Problems Addressed  New Description, New Description  Goals 1. Difficulty in relationship with husband Objective Development of strategies to regulate emotional response to husband's stress Target Date: 2022-03-03 Frequency: Biweekly  Progress: 0 Modality: individual  Related Interventions Therapist will work with Krista Lee to develop communication strategies by helping her to consider how she might respond to different situations and offering opportunities to practice, such as through written exercises and role plays Therapist will work with Krista Lee to develop her communication strategies, including labeling and expressing her emotions and using "I" statements 2. Persistent anxiety triggered by thoughts of worse case scenarios that feel  real Objective Decrease in frequency and severity of anxiety, increase in overall well bein Target Date: 2022-03-03 Frequency: Biweekly  Progress: 0 Modality: individual  Related Interventions Therapist will provide referrals for additional resources as appropriate Therapist will provide Krista Lee with strategies to regulate her emotions, including meditation, breathing exercises, mindfulness, and self-care. Therapist will help Krista Lee to identify and disengage from maladaptive thought patterns using CBT-based strategies Therapist will provide Krista Lee with opportunities to process her experiences in session Diagnosis Axis none 300.00 (Anxiety state, unspecified) - Open - [Signifier: n/a]    Conditions For Discharge Achievement of treatment goals and objectives          [name], PhD               [name], PhD

## 2021-08-07 ENCOUNTER — Ambulatory Visit: Payer: 59 | Attending: Advanced Practice Midwife | Admitting: Physical Therapy

## 2021-08-07 ENCOUNTER — Encounter: Payer: Self-pay | Admitting: Physical Therapy

## 2021-08-07 ENCOUNTER — Ambulatory Visit (INDEPENDENT_AMBULATORY_CARE_PROVIDER_SITE_OTHER): Payer: 59 | Admitting: Clinical

## 2021-08-07 DIAGNOSIS — F411 Generalized anxiety disorder: Secondary | ICD-10-CM | POA: Diagnosis not present

## 2021-08-07 DIAGNOSIS — M25651 Stiffness of right hip, not elsewhere classified: Secondary | ICD-10-CM | POA: Insufficient documentation

## 2021-08-07 DIAGNOSIS — M6281 Muscle weakness (generalized): Secondary | ICD-10-CM | POA: Insufficient documentation

## 2021-08-07 DIAGNOSIS — R252 Cramp and spasm: Secondary | ICD-10-CM | POA: Insufficient documentation

## 2021-08-07 NOTE — Progress Notes (Signed)
Diagnosis: F41.1 Time: 10:05am-10:59am CPT Code: [id number]  Krista Lee was seen remotely using secure video conferencing. She was in her home in [place] and the therapist was in her office at the time of the appointment. Session continued themes from her previous session, related to her tendency to perseverate on perceived mistakes at work. Therapist suggested remembering that the obsessive thoughts may be the result of her brain attempting to figure out a difficult emotion, and suggested incorporating physical strategies (breathing exercises, exercise). She is scheduled to be seen again in January, and will reach out if insurance will cover additional visits before then.    Treatment Plan Client Abilities/Strengths  Krista Lee presented as insightful and motivated, with clear goals for therapy.  Client Treatment Preferences  Krista Lee would like to begin with monthly appointments, and increase frequency once she has hit her insurance deductible.  Client Statement of Needs  Krista Lee is seeking CBT to address anxiety and relationship challenges.  Treatment Level  Monthly  Symptoms  Anxiety : vivid, persistent thoughts of worst case scenarios that are difficult to disengage from (Status: maintained). Relational Challenges: Difficulty with mutual triggering of uncomfortable emotions in marriage (Status: maintained).  Problems Addressed  New Description, New Description  Goals 1. Difficulty in relationship with husband Objective Development of strategies to regulate emotional response to husband's stress Target Date: 2022-03-03 Frequency: Biweekly  Progress: 0 Modality: individual  Related Interventions Therapist will work with Krista Lee to develop communication strategies by helping her to consider how she might respond to different situations and offering opportunities to practice, such as through written exercises and role plays Therapist will work with Krista Lee to develop her communication strategies,  including labeling and expressing her emotions and using "I" statements 2. Persistent anxiety triggered by thoughts of worse case scenarios that feel real Objective Decrease in frequency and severity of anxiety, increase in overall well bein Target Date: 2022-03-03 Frequency: Biweekly  Progress: 0 Modality: individual  Related Interventions Therapist will provide referrals for additional resources as appropriate Therapist will provide Krista Lee with strategies to regulate her emotions, including meditation, breathing exercises, mindfulness, and self-care. Therapist will help Krista Lee to identify and disengage from maladaptive thought patterns using CBT-based strategies Therapist will provide Krista Lee with opportunities to process her experiences in session Diagnosis Axis none 300.00 (Anxiety state, unspecified) - Open - [Signifier: n/a]    Conditions For Discharge Achievement of treatment goals and objectives          [name], PhD               [name], PhD               [name], PhD

## 2021-08-07 NOTE — Therapy (Signed)
OUTPATIENT PHYSICAL THERAPY FEMALE PELVIC EVALUATION   [name] MRN: [id number] DOB:03/07/1985, 36 y.o., female Today's Date: 08/07/2021   PT End of Session - 08/07/21 1611     Visit Number 3    Date for PT Re-Evaluation 10/02/21    Authorization Type friday    PT Start Time 1530    PT Stop Time 1610    PT Time Calculation (min) 40 min    Activity Tolerance Patient tolerated treatment well    Behavior During Therapy [facility] for tasks assessed/performed               Past Medical History:  Diagnosis Date   ADD (attention deficit disorder)    Allergy    no meds   Anemia    as adolescent   Anxiety    Depression    GERD (gastroesophageal reflux disease)    doesn't take any meds for this, diet controlled   Headache(784.0)    Heart murmur    as a child - no problems   History of bronchitis    as a teenager, no problems as adult   History of migraine    can't remember when the last one was   Insomnia    no meds   Panic attacks    Symptomatic cholelithiasis 09/25/2011   UTI (urinary tract infection)    Past Surgical History:  Procedure Laterality Date   CHOLECYSTECTOMY  10/22/2011   Procedure: LAPAROSCOPIC CHOLECYSTECTOMY;  Surgeon: Harl Bowie, MD;  Location: [facility];  Service: General;  Laterality: N/A;   DILATION AND EVACUATION N/A 12/07/2019   Procedure: DILATATION AND EVACUATION;  Surgeon: Mora Bellman, MD;  Location: [facility];  Service: Gynecology;  Laterality: N/A;  NEEDS ULTRASOUND GUIDANCE ANORA KIT SENT    HYSTEROSCOPY WITH RESECTOSCOPE N/A 04/28/2013   Procedure:  HYSTEROSCOPY ;  Surgeon: Princess Bruins, MD;  Location: [facility];  Service: Gynecology;  Laterality: N/A;   LAPAROSCOPY N/A 04/28/2013   Procedure: LAPAROSCOPY DIAGNOSTIC;  Surgeon: Princess Bruins, MD;  Location: [facility];  Service: Gynecology;  Laterality: N/A;  2 hrs. total   OPERATIVE ULTRASOUND N/A 12/07/2019   Procedure: OPERATIVE ULTRASOUND;   Surgeon: Mora Bellman, MD;  Location: [facility];  Service: Gynecology;  Laterality: N/A;   WISDOM TOOTH EXTRACTION  31yr ago   Patient Active Problem List   Diagnosis Date Noted   SVD (spontaneous vaginal delivery) 04/19/2021   Hypokalemia 04/06/2021   Ehlers-Danlos syndrome 02/06/2021   Missed abortion    Female infertility 08/17/2019    PCP: No pcp  REFERRING PROVIDER: LElvera Maria CNM  REFERRING DIAG: [id number](ICD-10-CM) - Postpartum pain; R10.2,G89.29 (ICD-10-CM) - Chronic pelvic pain in female  THERAPY DIAG:  Cramp and spasm  Muscle weakness (generalized)  Stiffness of right hip, not elsewhere classified  Rationale for Evaluation and Treatment Rehabilitation  ONSET DATE: 4-6 weeks ago  SUBJECTIVE:  SUBJECTIVE STATEMENT: I have left lower abdominal pain.  The back pain is still happening.  But I haven't been able to do PT that much because of so much going on.  The fuzzy feeling and discomfort in the midback were better with dry needling    PAIN:  Are you having pain? Yes  NPRS scale: 3/10 Pain location: lower abdomen cramping  Pain type: tingling Pain description: intermittent   Aggravating factors: not sure; been more frequent since working Relieving factors: not very irritable  PRECAUTIONS: None  WEIGHT BEARING RESTRICTIONS No  FALLS:  Has patient fallen in last 6 months? No  LIVING ENVIRONMENT: Lives with: lives with their family, lives with their spouse, and infant Lives in: House/apartment  OCCUPATION: hair dresser  PLOF: Independent  PATIENT GOALS back and being able to strengthen and stop from having muscle spasms in jaw and pelvic floor  PERTINENT HISTORY:  Had a couple of falls - one severe with concussion when 36 y/o Sexual  abuse: No  BOWEL MOVEMENT Pain with bowel movement: No Type of bowel movement:Type (Bristol Stool Scale) small and hard stool, Frequency 1-3 days, and Strain No Fully empty rectum: Yes: mostly Leakage: No   URINATION Pain with urination: No Fully empty bladder: Yes:   Stream: Strong Urgency: No Frequency: can hold it for a while so I try to go more frequently Leakage:  no   INTERCOURSE Pain with intercourse:  have in the past - not lately but just beginning after having a baby Ability to have vaginal penetration:  Yes: but haven't tried much or in many different positions Climax: unable to fully Marinoff Scale: unknown/3  PREGNANCY Vaginal deliveries 1 Tearing No   PROLAPSE None    OBJECTIVE:   DIAGNOSTIC FINDINGS:    PATIENT SURVEYS:     COGNITION:  Overall cognitive status: Within functional limits for tasks assessed     MUSCLE LENGTH: Hamstrings: Right wfl deg; Left wfl deg   LUMBAR SPECIAL TESTS:  fwd  FUNCTIONAL TESTS:  Single leg stand - rt side more medial collapse in arch  GAIT: Comments: WFL               POSTURE: forward head, increased lumbar lordosis, and increased thoracic kyphosis   PELVIC ALIGNMENT:  LUMBARAROM/PROM WFL for all below A/PROM A/PROM  eval  Flexion   Extension   Right lateral flexion   Left lateral flexion   Right rotation   Left rotation    (Blank rows = not tested)  LOWER EXTREMITY ROM: AROM jaw open/close and lateral glides - WFL no pain Passive ROM Right eval Left eval  Hip flexion 90% 100%  Hip extension    Hip abduction    Hip adduction    Hip internal rotation    Hip external rotation    Knee flexion    Knee extension    Ankle dorsiflexion    Ankle plantarflexion    Ankle inversion    Ankle eversion     (Blank rows = not tested)  LOWER EXTREMITY MMT:  MMT Right eval Left eval  Hip flexion    Hip extension    Hip abduction    Hip adduction    Hip internal rotation    Hip  external rotation    Knee flexion    Knee extension    Ankle dorsiflexion    Ankle plantarflexion    Ankle inversion    Ankle eversion      PALPATION:   General  lumbar and cervical paraspinals tight; suboccipitals tight, restriction around right lower quadrant ovary and cecum, masseter tight to palpation                External Perineal Exam normal                             Internal Pelvic Floor Tender to coccyeus and levators on rt side; obdurator internus on the left side  Patient confirms identification and approves PT to assess internal pelvic floor and treatment Yes No emotional/communication barriers or cognitive limitation. Patient is motivated to learn. Patient understands and agrees with treatment goals and plan. PT explains patient will be examined in standing, sitting, and lying down to see how their muscles and joints work. When they are ready, they will be asked to remove their underwear so PT can examine their perineum. The patient is also given the option of providing their own chaperone as one is not provided in our facility. The patient also has the right and is explained the right to defer or refuse any part of the evaluation or treatment including the internal exam. With the patient's consent, PT will use one gloved finger to gently assess the muscles of the pelvic floor, seeing how well it contracts and relaxes and if there is muscle symmetry. After, the patient will get dressed and PT and patient will discuss exam findings and plan of care. PT and patient discuss plan of care, schedule, attendance policy and HEP activities.  PELVIC MMT:   MMT eval  Vaginal 2/5 x 3 sec ; 4 reps  Internal Anal Sphincter   External Anal Sphincter   Puborectalis   Diastasis Recti no  (Blank rows = not tested)        TONE: Higher with difficulty relaxing after one contraction  PROLAPSE: no  TODAY'S TREATMENT  Treatment: 08/07/21 Exercises  Wall push up Standing UE reach with  lean Rotaiton with red band  Manual  Thoracic paraspinals elongation Trigger Point Dry-Needling  Treatment instructions: Expect mild to moderate muscle soreness. S/S of pneumothorax if dry needled over a lung field, and to seek immediate medical attention should they occur. Patient verbalized understanding of these instructions and education.  Patient Consent Given: Yes Education handout provided: Yes Muscles treated: thoracic multifidi Electrical stimulation performed: No Parameters: N/A Treatment response/outcome: increased soft tissue length  Treatment: 07/24/21 Exercises  Bird dog Supine heel slide SLR 90-90 toe taps  Manual  Thoracic paraspinals elongation; cervical paraspinals, suboccipitlas; masseter and sublingual muscles Trigger Point Dry-Needling  Treatment instructions: Expect mild to moderate muscle soreness. S/S of pneumothorax if dry needled over a lung field, and to seek immediate medical attention should they occur. Patient verbalized understanding of these instructions and education.  Patient Consent Given: Yes Education handout provided: Yes Muscles treated: cervical and thoracic multifidi Electrical stimulation performed: No Parameters: N/A Treatment response/outcome: increased soft tissue length  Nuero Re-ed Transversus abdominus activation   PATIENT EDUCATION:  Education details: Access Code: [email or web address] Person educated: Patient Education method: Consulting civil engineer, Demonstration, Corporate treasurer cues, Verbal cues, and Handouts Education comprehension: verbalized understanding and returned demonstration   HOME EXERCISE PROGRAM: Access Code: [email or web address] URL: [email or web address] Date: 07/24/2021 Prepared by: Murphy  - 1 x daily - 7 x weekly - 3 sets - 10 reps - Child's Pose Side Bend  - 1 x daily - 7 x weekly - 1 sets - 6 reps - 30 sec  hold - Supine Upper Trunk and Cervical Rotation with Opposite Lower Trunk Rotation   - 1 x daily - 7 x weekly - 1 sets - 10 reps - 5 hold - Supine Butterfly Groin Stretch  - 1 x daily - 7 x weekly - 1 sets - 3 reps - 30 sec hold - Bird Dog  - 1 x daily - 7 x weekly - 3 sets - 10 reps - Supine 90/90 Alternating Heel Touches with Posterior Pelvic Tilt  - 1 x daily - 7 x weekly - 3 sets - 10 reps - Supine 90/90 Shoulder Flexion with Abdominal Bracing  - 1 x daily - 7 x weekly - 3 sets - 10 reps  Patient Education - Trigger Point Dry Needling   ASSESSMENT:  CLINICAL IMPRESSION: Patient is still having abdominal and back pain.  She responded well to dry needling last session so this was done again as there were still trigger points around T8-10.  Pt was also given exercises for engaging the transversus abdominus in standing to work towards more functional movements with greater transversus abdominus strength and reduced reliance on the pelvic floor.  Pt will benefit from skilled PT to address posture and strength.   OBJECTIVE IMPAIRMENTS decreased coordination, decreased endurance, decreased ROM, decreased strength, increased fascial restrictions, increased muscle spasms, impaired tone, postural dysfunction, and pain.   ACTIVITY LIMITATIONS  exercise  PARTICIPATION LIMITATIONS: interpersonal relationship  PERSONAL FACTORS 1-2 comorbidities: history of fall on back and concussion; vaginal delivery  are also affecting patient's functional outcome.   REHAB POTENTIAL: Excellent  CLINICAL DECISION MAKING: Stable/uncomplicated  EVALUATION COMPLEXITY: Low   GOALS: Goals reviewed with patient? Yes  SHORT TERM GOALS: Target date: 08/07/2021  Ind with initial HEP Baseline: Goal status: met 07/24/21     LONG TERM GOALS: Target date: 10/02/2021   Pt will be independent with advanced HEP to maintain improvements made throughout therapy  Baseline:  Goal status: IN PROGRESS  2.  Pt will report 80% reduction of pain and muscle spasms during typical day due to improvements in  posture, strength, and muscle length  Baseline:  Goal status: IN PROGRESS  3.  Pt will have 0/3 score of Marinoff scale at least 75% of the time  Baseline:  Goal status: IN PROGRESS  4.  Pt will be able to return to normal gym and core workout without increased back pain due to improved deep core strength Baseline:  Goal status: IN PROGRESS   PLAN: PT FREQUENCY: 1x/week  PT DURATION: 12 weeks  PLANNED INTERVENTIONS: Therapeutic exercises, Therapeutic activity, Neuromuscular re-education, Balance training, Gait training, Patient/Family education, Joint mobilization, Dry Needling, Electrical stimulation, Cryotherapy, Moist heat, Taping, Biofeedback, and Manual therapy  PLAN FOR NEXT SESSION: transversus abdominus activation, pelvic floor isolation making sure she is fully releaxed btw reps maybe use one finger to perineal body to guide contract and bulge   [employer], PT 08/07/2021, 4:12 PM

## 2021-08-14 ENCOUNTER — Ambulatory Visit: Payer: 59 | Admitting: Physical Therapy

## 2021-08-18 ENCOUNTER — Ambulatory Visit
Admission: EM | Admit: 2021-08-18 | Discharge: 2021-08-18 | Disposition: A | Discharge status: Home / Self Care | Payer: 59 | Attending: Family Medicine | Admitting: Family Medicine

## 2021-08-18 ENCOUNTER — Encounter: Payer: Self-pay | Admitting: Emergency Medicine

## 2021-08-18 ENCOUNTER — Telehealth: Payer: Self-pay | Admitting: Emergency Medicine

## 2021-08-18 DIAGNOSIS — H109 Unspecified conjunctivitis: Secondary | ICD-10-CM

## 2021-08-18 MED ORDER — SULFACETAMIDE SODIUM 10 % OP SOLN: 1.0000 [drp] | Freq: Three times a day (TID) | OPHTHALMIC | 0 refills | Status: AC

## 2021-08-18 NOTE — Telephone Encounter (Signed)
Call from [facility] regarding itching in Right eye. Pt stated she was instructed by provider to use eye gtts in both eyes and not just her left if she became symptomatic in R eye. RN confirmed verbally this was correct and it was okay to use the drops in both eyes. Pt instructed to change her pillow case the next few nights. Pt verbalized an understanding. No other questions at this time.

## 2021-08-18 NOTE — Discharge Instructions (Addendum)
Advised patient to instill eyedrops as directed.  Advised patient if symptoms worsen and/or unresolved please follow-up with your optometrist/ophthalmology or here for further evaluation.

## 2021-08-18 NOTE — ED Provider Notes (Signed)
[name]    CSN: [id number] Arrival date & time: 08/18/21  0321      History   Chief Complaint Chief Complaint  Patient presents with  . Conjunctivitis    Left     HPI Krista Lee is a 36 y.o. female.   HPI 36 year old female presents with left eye redness and crusting discharge this morning.  Patient reports has child in daycare.  PMH significant for ADD, Ehlers-Danlos syndrome, and hypokalemia.  Patient inquires if she needs to be out of work for more than 24 hours and can her husband use the same medication if he develops symptoms because he is having kidney stone removal surgery early next week.  Advised patient that she should remain out of work for 48 hours and that her husband should be evaluated separately and not to use medication prescribed to her.  Past Medical History:  Diagnosis Date  . ADD (attention deficit disorder)   . Allergy    no meds  . Anemia    as adolescent  . Anxiety   . Depression   . GERD (gastroesophageal reflux disease)    doesn't take any meds for this, diet controlled  . Headache(784.0)   . Heart murmur    as a child - no problems  . History of bronchitis    as a teenager, no problems as adult  . History of migraine    can't remember when the last one was  . Insomnia    no meds  . Panic attacks   . Symptomatic cholelithiasis 09/25/2011  . UTI (urinary tract infection)     Patient Active Problem List   Diagnosis Date Noted  . SVD (spontaneous vaginal delivery) 04/19/2021  . Hypokalemia 04/06/2021  . Ehlers-Danlos syndrome 02/06/2021  . Missed abortion   . Female infertility 08/17/2019    Past Surgical History:  Procedure Laterality Date  . CHOLECYSTECTOMY  10/22/2011   Procedure: LAPAROSCOPIC CHOLECYSTECTOMY;  Surgeon: Harl Bowie, MD;  Location: [facility];  Service: General;  Laterality: N/A;  . DILATION AND EVACUATION N/A 12/07/2019   Procedure: DILATATION AND EVACUATION;  Surgeon: Mora Bellman, MD;   Location: [facility];  Service: Gynecology;  Laterality: N/A;  NEEDS ULTRASOUND GUIDANCE ANORA KIT SENT   . HYSTEROSCOPY WITH RESECTOSCOPE N/A 04/28/2013   Procedure:  HYSTEROSCOPY ;  Surgeon: Princess Bruins, MD;  Location: [facility];  Service: Gynecology;  Laterality: N/A;  . LAPAROSCOPY N/A 04/28/2013   Procedure: LAPAROSCOPY DIAGNOSTIC;  Surgeon: Princess Bruins, MD;  Location: [facility];  Service: Gynecology;  Laterality: N/A;  2 hrs. total  . OPERATIVE ULTRASOUND N/A 12/07/2019   Procedure: OPERATIVE ULTRASOUND;  Surgeon: Mora Bellman, MD;  Location: [facility];  Service: Gynecology;  Laterality: N/A;  . WISDOM TOOTH EXTRACTION  50yr ago    OB History     Gravida  2   Para  1   Term  1   Preterm  0   AB  1   Living  1      SAB  1   IAB  0   Ectopic  0   Multiple  0   Live Births  1            Home Medications    Prior to Admission medications   Medication Sig Start Date End Date Taking? Authorizing Provider  sulfacetamide (BLEPH-10) 10 % ophthalmic solution Place 1 drop into the left eye in the morning, at noon, and  at bedtime for 7 days. 08/18/21 08/25/21 Yes Eliezer Lofts, FNP  acetaminophen (TYLENOL) 500 MG tablet Take 2 tablets (1,000 mg total) by mouth every 8 (eight) hours as needed (pain). Patient not taking: Reported on 08/18/2021 04/21/21   Cresenzo-Dishmon, Joaquim Lai, CNM  cetirizine (ZYRTEC) 10 MG tablet Take 10 mg by mouth daily as needed for allergies. Patient not taking: Reported on 08/18/2021    [provider]  famotidine (PEPCID) 20 MG tablet Take 20 mg by mouth 2 (two) times daily as needed for heartburn or indigestion. Patient not taking: Reported on 06/06/2021    [provider]  Lactic Ac-Citric Ac-Pot Bitart (PHEXXI) 1.8-1-0.4 % GEL Place 5 g vaginally as needed. 06/06/21   Leftwich-Kirby, Kathie Dike, CNM  Misc. Devices (BREAST PUMP) MISC 1 Device by Does not apply route daily. 02/06/21   Leftwich-Kirby,  Kathie Dike, CNM  Prenatal Vit-Fe Fumarate-FA (MULTIVITAMIN-PRENATAL) 27-0.8 MG TABS tablet Take 1 tablet by mouth daily at 12 noon.    [provider]  Probiotic Product (PROBIOTIC PO) Take 1 capsule by mouth daily.    [provider]  VITAMIN D PO Take 1 capsule by mouth daily.    [provider]    Family History Family History  Problem Relation Age of Onset  . Hypertension Mother   . Diverticulitis Mother   . Hyperlipidemia Mother   . Arthritis Father   . Rheum arthritis Paternal Grandmother     Social History Social History   Tobacco Use  . Smoking status: Former    Packs/day: 0.50    Years: 8.00    Total pack years: 4.00    Types: Cigarettes    Quit date: 08/2019    Years since quitting: 2.0  . Smokeless tobacco: Former    Quit date: 01/02/2011  Vaping Use  . Vaping Use: Never used  Substance Use Topics  . Alcohol use: Yes    Alcohol/week: 1.0 standard drink of alcohol    Types: 1 Glasses of wine per week  . Drug use: No     Allergies   Prozac [fluoxetine hcl], Citrus, Ibuprofen, and Percocet [oxycodone-acetaminophen]   Review of Systems Review of Systems  Eyes:  Positive for redness.     Physical Exam Triage Vital Signs ED Triage Vitals  Enc Vitals Group     BP      Pulse      Resp      Temp      Temp src      SpO2      Weight      Height      Head Circumference      Peak Flow      Pain Score      Pain Loc      Pain Edu?      Excl. in [place]?    No data found.  Updated Vital Signs BP 105/72 (BP Location: Left Arm)   Pulse 79   Temp 99.3 F (37.4 C) (Oral)   Resp 16   Ht _0  (1.676 m)   Wt 130 lb (59 kg)   LMP  (LMP Unknown)   SpO2 98%   Breastfeeding Yes   BMI 20.98 kg/m       Physical Exam Vitals and nursing note reviewed.  Constitutional:      Appearance: Normal appearance. She is normal weight.  HENT:     Head: Normocephalic and atraumatic.     Mouth/Throat:     Mouth: Mucous membranes  are moist.      Pharynx: Oropharynx is clear.  Eyes:     Extraocular Movements: Extraocular movements intact.     Conjunctiva/sclera: Conjunctivae normal.     Pupils: Pupils are equal, round, and reactive to light.     Comments: Left eye: Sclera with +1 injection noted  Cardiovascular:     Rate and Rhythm: Normal rate and regular rhythm.     Pulses: Normal pulses.     Heart sounds: Normal heart sounds.  Pulmonary:     Effort: Pulmonary effort is normal.     Breath sounds: Normal breath sounds. No wheezing, rhonchi or rales.  Musculoskeletal:     Cervical back: Normal range of motion and neck supple.  Skin:    General: Skin is warm and dry.  Neurological:     General: No focal deficit present.     Mental Status: She is alert and oriented to person, place, and time.     UC Treatments / Results  Labs (all labs ordered are listed, but only abnormal results are displayed) Labs Reviewed - No data to display  EKG   Radiology No results found.  Procedures Procedures (including critical care time)  Medications Ordered in UC Medications - No data to display  Initial Impression / Assessment and Plan / UC Course  I have reviewed the triage vital signs and the nursing notes.  Pertinent labs & imaging results that were available during my care of the patient were reviewed by me and considered in my medical decision making (see chart for details).     MDM: 1.  Conjunctivitis of left eye, unspecified conjunctivitis type-Rx'd Sulfacetamide. Advised patient that she should remain out of work for 48 hours and that her husband should be evaluated separately and not to use medication prescribed to her. Advised patient to instill eyedrops as directed.  Advised patient if symptoms worsen and/or unresolved please follow-up with your optometrist/ophthalmology or here for further evaluation.  Work note provided to patient prior to discharge.  Patient discharged home, hemodynamically stable.  Final  Clinical Impressions(s) / UC Diagnoses   Final diagnoses:  Conjunctivitis of left eye, unspecified conjunctivitis type     Discharge Instructions      Advised patient to instill eyedrops as directed.  Advised patient if symptoms worsen and/or unresolved please follow-up with your optometrist/ophthalmology or here for further evaluation.     ED Prescriptions     Medication Sig Dispense Auth. Provider   sulfacetamide (BLEPH-10) 10 % ophthalmic solution Place 1 drop into the left eye in the morning, at noon, and at bedtime for 7 days. 1.1 mL Eliezer Lofts, FNP      PDMP not reviewed this encounter.

## 2021-08-18 NOTE — ED Triage Notes (Signed)
Left eye pain with redness Woke up with pain to left eye w/ crust Child is in daycare

## 2021-08-21 ENCOUNTER — Ambulatory Visit: Payer: 59 | Admitting: Clinical

## 2021-08-21 ENCOUNTER — Ambulatory Visit: Payer: 59 | Admitting: Physical Therapy

## 2021-08-27 NOTE — Therapy (Unsigned)
OUTPATIENT PHYSICAL THERAPY FEMALE PELVIC EVALUATION   [name] MRN: [id number] DOB:June 14, 1985, 36 y.o., female Today's Date: 08/27/2021       Past Medical History:  Diagnosis Date   ADD (attention deficit disorder)    Allergy    no meds   Anemia    as adolescent   Anxiety    Depression    GERD (gastroesophageal reflux disease)    doesn't take any meds for this, diet controlled   Headache(784.0)    Heart murmur    as a child - no problems   History of bronchitis    as a teenager, no problems as adult   History of migraine    can't remember when the last one was   Insomnia    no meds   Panic attacks    Symptomatic cholelithiasis 09/25/2011   UTI (urinary tract infection)    Past Surgical History:  Procedure Laterality Date   CHOLECYSTECTOMY  10/22/2011   Procedure: LAPAROSCOPIC CHOLECYSTECTOMY;  Surgeon: Harl Bowie, MD;  Location: [facility];  Service: General;  Laterality: N/A;   DILATION AND EVACUATION N/A 12/07/2019   Procedure: DILATATION AND EVACUATION;  Surgeon: Mora Bellman, MD;  Location: [facility];  Service: Gynecology;  Laterality: N/A;  NEEDS ULTRASOUND GUIDANCE ANORA KIT SENT    HYSTEROSCOPY WITH RESECTOSCOPE N/A 04/28/2013   Procedure:  HYSTEROSCOPY ;  Surgeon: Princess Bruins, MD;  Location: [facility];  Service: Gynecology;  Laterality: N/A;   LAPAROSCOPY N/A 04/28/2013   Procedure: LAPAROSCOPY DIAGNOSTIC;  Surgeon: Princess Bruins, MD;  Location: [facility];  Service: Gynecology;  Laterality: N/A;  2 hrs. total   OPERATIVE ULTRASOUND N/A 12/07/2019   Procedure: OPERATIVE ULTRASOUND;  Surgeon: Mora Bellman, MD;  Location: [place];  Service: Gynecology;  Laterality: N/A;   WISDOM TOOTH EXTRACTION  41yr ago   Patient Active Problem List   Diagnosis Date Noted   SVD (spontaneous vaginal delivery) 04/19/2021   Hypokalemia 04/06/2021   Ehlers-Danlos syndrome 02/06/2021   Missed abortion    Female  infertility 08/17/2019    PCP: No pcp  REFERRING PROVIDER: LElvera Maria CNM  REFERRING DIAG: [phone](ICD-10-CM) - Postpartum pain; R10.2,G89.29 (ICD-10-CM) - Chronic pelvic pain in female  THERAPY DIAG:  No diagnosis found.  Rationale for Evaluation and Treatment Rehabilitation  ONSET DATE: 4-6 weeks ago  SUBJECTIVE:                                                                                                                                                                                           SUBJECTIVE STATEMENT: I have left  lower abdominal pain.  The back pain is still happening.  But I haven't been able to do PT that much because of so much going on.  The fuzzy feeling and discomfort in the midback were better with dry needling    PAIN:  Are you having pain? Yes  NPRS scale: 3/10 Pain location: lower abdomen cramping  Pain type: tingling Pain description: intermittent   Aggravating factors: not sure; been more frequent since working Relieving factors: not very irritable  PRECAUTIONS: None  WEIGHT BEARING RESTRICTIONS No  FALLS:  Has patient fallen in last 6 months? No  LIVING ENVIRONMENT: Lives with: lives with their family, lives with their spouse, and infant Lives in: House/apartment  OCCUPATION: hair dresser  PLOF: Independent  PATIENT GOALS back and being able to strengthen and stop from having muscle spasms in jaw and pelvic floor  PERTINENT HISTORY:  Had a couple of falls - one severe with concussion when 36 y/o Sexual abuse: No  BOWEL MOVEMENT Pain with bowel movement: No Type of bowel movement:Type (Bristol Stool Scale) small and hard stool, Frequency 1-3 days, and Strain No Fully empty rectum: Yes: mostly Leakage: No   URINATION Pain with urination: No Fully empty bladder: Yes:   Stream: Strong Urgency: No Frequency: can hold it for a while so I try to go more frequently Leakage:  no   INTERCOURSE Pain with  intercourse:  have in the past - not lately but just beginning after having a baby Ability to have vaginal penetration:  Yes: but haven't tried much or in many different positions Climax: unable to fully Marinoff Scale: unknown/3  PREGNANCY Vaginal deliveries 1 Tearing No   PROLAPSE None    OBJECTIVE:   DIAGNOSTIC FINDINGS:    PATIENT SURVEYS:     COGNITION:  Overall cognitive status: Within functional limits for tasks assessed     MUSCLE LENGTH: Hamstrings: Right wfl deg; Left wfl deg   LUMBAR SPECIAL TESTS:  fwd  FUNCTIONAL TESTS:  Single leg stand - rt side more medial collapse in arch  GAIT: Comments: WFL               POSTURE: forward head, increased lumbar lordosis, and increased thoracic kyphosis   PELVIC ALIGNMENT:  LUMBARAROM/PROM WFL for all below A/PROM A/PROM  eval  Flexion   Extension   Right lateral flexion   Left lateral flexion   Right rotation   Left rotation    (Blank rows = not tested)  LOWER EXTREMITY ROM: AROM jaw open/close and lateral glides - WFL no pain Passive ROM Right eval Left eval  Hip flexion 90% 100%  Hip extension    Hip abduction    Hip adduction    Hip internal rotation    Hip external rotation    Knee flexion    Knee extension    Ankle dorsiflexion    Ankle plantarflexion    Ankle inversion    Ankle eversion     (Blank rows = not tested)  LOWER EXTREMITY MMT:  MMT Right eval Left eval  Hip flexion    Hip extension    Hip abduction    Hip adduction    Hip internal rotation    Hip external rotation    Knee flexion    Knee extension    Ankle dorsiflexion    Ankle plantarflexion    Ankle inversion    Ankle eversion      PALPATION:   General  lumbar and cervical paraspinals tight; suboccipitals  tight, restriction around right lower quadrant ovary and cecum, masseter tight to palpation                External Perineal Exam normal                             Internal Pelvic Floor Tender to  coccyeus and levators on rt side; obdurator internus on the left side  Patient confirms identification and approves PT to assess internal pelvic floor and treatment Yes No emotional/communication barriers or cognitive limitation. Patient is motivated to learn. Patient understands and agrees with treatment goals and plan. PT explains patient will be examined in standing, sitting, and lying down to see how their muscles and joints work. When they are ready, they will be asked to remove their underwear so PT can examine their perineum. The patient is also given the option of providing their own chaperone as one is not provided in our facility. The patient also has the right and is explained the right to defer or refuse any part of the evaluation or treatment including the internal exam. With the patient's consent, PT will use one gloved finger to gently assess the muscles of the pelvic floor, seeing how well it contracts and relaxes and if there is muscle symmetry. After, the patient will get dressed and PT and patient will discuss exam findings and plan of care. PT and patient discuss plan of care, schedule, attendance policy and HEP activities.  PELVIC MMT:   MMT eval  Vaginal 2/5 x 3 sec ; 4 reps  Internal Anal Sphincter   External Anal Sphincter   Puborectalis   Diastasis Recti no  (Blank rows = not tested)        TONE: Higher with difficulty relaxing after one contraction  PROLAPSE: no  TODAY'S TREATMENT  Treatment: 08/07/21 Exercises  Wall push up Standing UE reach with lean Rotaiton with red band  Manual  Thoracic paraspinals elongation Trigger Point Dry-Needling  Treatment instructions: Expect mild to moderate muscle soreness. S/S of pneumothorax if dry needled over a lung field, and to seek immediate medical attention should they occur. Patient verbalized understanding of these instructions and education.  Patient Consent Given: Yes Education handout provided: Yes Muscles  treated: thoracic multifidi Electrical stimulation performed: No Parameters: N/A Treatment response/outcome: increased soft tissue length  Treatment: 07/24/21 Exercises  Bird dog Supine heel slide SLR 90-90 toe taps  Manual  Thoracic paraspinals elongation; cervical paraspinals, suboccipitlas; masseter and sublingual muscles Trigger Point Dry-Needling  Treatment instructions: Expect mild to moderate muscle soreness. S/S of pneumothorax if dry needled over a lung field, and to seek immediate medical attention should they occur. Patient verbalized understanding of these instructions and education.  Patient Consent Given: Yes Education handout provided: Yes Muscles treated: cervical and thoracic multifidi Electrical stimulation performed: No Parameters: N/A Treatment response/outcome: increased soft tissue length  Nuero Re-ed Transversus abdominus activation   PATIENT EDUCATION:  Education details: Access Code: [email or web address] Person educated: Patient Education method: Consulting civil engineer, Demonstration, Corporate treasurer cues, Verbal cues, and Handouts Education comprehension: verbalized understanding and returned demonstration   HOME EXERCISE PROGRAM: Access Code: [email or web address] URL: [email or web address] Date: 07/24/2021 Prepared by: [place]  - 1 x daily - 7 x weekly - 3 sets - 10 reps - Child's Pose Side Bend  - 1 x daily - 7 x weekly - 1 sets - 6 reps - 30 sec hold - Supine Upper Trunk  and Cervical Rotation with Opposite Lower Trunk Rotation  - 1 x daily - 7 x weekly - 1 sets - 10 reps - 5 hold - Supine Butterfly Groin Stretch  - 1 x daily - 7 x weekly - 1 sets - 3 reps - 30 sec hold - Bird Dog  - 1 x daily - 7 x weekly - 3 sets - 10 reps - Supine 90/90 Alternating Heel Touches with Posterior Pelvic Tilt  - 1 x daily - 7 x weekly - 3 sets - 10 reps - Supine 90/90 Shoulder Flexion with Abdominal Bracing  - 1 x daily - 7 x weekly - 3 sets - 10  reps  Patient Education - Trigger Point Dry Needling   ASSESSMENT:  CLINICAL IMPRESSION: Patient is still having abdominal and back pain.  She responded well to dry needling last session so this was done again as there were still trigger points around T8-10.  Pt was also given exercises for engaging the transversus abdominus in standing to work towards more functional movements with greater transversus abdominus strength and reduced reliance on the pelvic floor.  Pt will benefit from skilled PT to address posture and strength.   OBJECTIVE IMPAIRMENTS decreased coordination, decreased endurance, decreased ROM, decreased strength, increased fascial restrictions, increased muscle spasms, impaired tone, postural dysfunction, and pain.   ACTIVITY LIMITATIONS  exercise  PARTICIPATION LIMITATIONS: interpersonal relationship  PERSONAL FACTORS 1-2 comorbidities: history of fall on back and concussion; vaginal delivery  are also affecting patient's functional outcome.   REHAB POTENTIAL: Excellent  CLINICAL DECISION MAKING: Stable/uncomplicated  EVALUATION COMPLEXITY: Low   GOALS: Goals reviewed with patient? Yes  SHORT TERM GOALS: Target date: 08/07/2021  Ind with initial HEP Baseline: Goal status: met 07/24/21     LONG TERM GOALS: Target date: 10/02/2021   Pt will be independent with advanced HEP to maintain improvements made throughout therapy  Baseline:  Goal status: IN PROGRESS  2.  Pt will report 80% reduction of pain and muscle spasms during typical day due to improvements in posture, strength, and muscle length  Baseline:  Goal status: IN PROGRESS  3.  Pt will have 0/3 score of Marinoff scale at least 75% of the time  Baseline:  Goal status: IN PROGRESS  4.  Pt will be able to return to normal gym and core workout without increased back pain due to improved deep core strength Baseline:  Goal status: IN PROGRESS   PLAN: PT FREQUENCY: 1x/week  PT DURATION: 12  weeks  PLANNED INTERVENTIONS: Therapeutic exercises, Therapeutic activity, Neuromuscular re-education, Balance training, Gait training, Patient/Family education, Joint mobilization, Dry Needling, Electrical stimulation, Cryotherapy, Moist heat, Taping, Biofeedback, and Manual therapy  PLAN FOR NEXT SESSION: transversus abdominus activation, pelvic floor isolation making sure she is fully releaxed btw reps maybe use one finger to perineal body to guide contract and bulge   [employer], PT 08/27/2021, 4:25 PM

## 2021-08-28 ENCOUNTER — Ambulatory Visit: Payer: 59 | Admitting: Physical Therapy

## 2021-08-28 ENCOUNTER — Encounter: Payer: Self-pay | Admitting: Physical Therapy

## 2021-08-28 DIAGNOSIS — R252 Cramp and spasm: Secondary | ICD-10-CM | POA: Diagnosis not present

## 2021-08-28 DIAGNOSIS — M25651 Stiffness of right hip, not elsewhere classified: Secondary | ICD-10-CM

## 2021-08-28 DIAGNOSIS — M6281 Muscle weakness (generalized): Secondary | ICD-10-CM

## 2021-09-18 ENCOUNTER — Ambulatory Visit: Payer: 59 | Admitting: Clinical

## 2021-10-02 ENCOUNTER — Ambulatory Visit: Payer: 59 | Admitting: Clinical

## 2021-10-05 ENCOUNTER — Ambulatory Visit
Admission: EM | Admit: 2021-10-05 | Discharge: 2021-10-05 | Disposition: A | Discharge status: Home / Self Care | Payer: Commercial Managed Care - HMO | Attending: Urgent Care | Admitting: Urgent Care

## 2021-10-05 DIAGNOSIS — J069 Acute upper respiratory infection, unspecified: Secondary | ICD-10-CM | POA: Diagnosis present

## 2021-10-05 LAB — POCT RAPID STREP A (OFFICE): Rapid Strep A Screen: NEGATIVE

## 2021-10-05 MED ORDER — BENZONATATE 100 MG PO CAPS: 100.0000 mg | ORAL_CAPSULE | Freq: Three times a day (TID) | ORAL | 0 refills | Status: DC | PRN

## 2021-10-05 NOTE — Discharge Instructions (Addendum)
Your sore throat is likely related to a virus.  Your strep test is negative.  We will call with the results of the throat swab if it requires treatment with antibiotics. Please alternate tylenol with ibuprofen to help with the discomfort or fever. You may also try chloraseptic spray or cepacol lozenges. Monitor for fever >5 days or non-responsive to medications, worsening symptoms, or rash which may warrant recheck in clinic.

## 2021-10-05 NOTE — ED Triage Notes (Signed)
Pt presents with c/o cough, sore throat and fever that began 3 days ago.

## 2021-10-05 NOTE — ED Provider Notes (Signed)
[name]    CSN: [id number] Arrival date & time: 10/05/21  1138      History   Chief Complaint Chief Complaint  Patient presents with   Cough    HPI Krista Lee is a 36 y.o. female.   Pleasant 36 year old female presents today due to concerns of a fever, cough, sore throat over the past 3 days.  States symptoms started Monday evening with primarily just the sore throat.  Woke up on Tuesday not feeling well, fatigue and fever at that time.  States she has a baby in daycare who recently had similar symptoms and was diagnosed with an ear infection last week.  Patient reports the cough is nonproductive.  Denies any shortness of breath or chest pain.  No lymphadenopathy.  No headaches or nuchal rigidity.  Has been taking over-the-counter ibuprofen and Tylenol which has been helpful for her symptoms.  Last dose of Tylenol was 11 PM last evening.  Denies any GI symptoms, still has a normal appetite.   Cough   Past Medical History:  Diagnosis Date   ADD (attention deficit disorder)    Allergy    no meds   Anemia    as adolescent   Anxiety    Depression    GERD (gastroesophageal reflux disease)    doesn't take any meds for this, diet controlled   Headache(784.0)    Heart murmur    as a child - no problems   History of bronchitis    as a teenager, no problems as adult   History of migraine    can't remember when the last one was   Insomnia    no meds   Panic attacks    Symptomatic cholelithiasis 09/25/2011   UTI (urinary tract infection)     Patient Active Problem List   Diagnosis Date Noted   SVD (spontaneous vaginal delivery) 04/19/2021   Hypokalemia 04/06/2021   Ehlers-Danlos syndrome 02/06/2021   Missed abortion    Female infertility 08/17/2019    Past Surgical History:  Procedure Laterality Date   CHOLECYSTECTOMY  10/22/2011   Procedure: LAPAROSCOPIC CHOLECYSTECTOMY;  Surgeon: Harl Bowie, MD;  Location: [place];  Service: General;   Laterality: N/A;   DILATION AND EVACUATION N/A 12/07/2019   Procedure: DILATATION AND EVACUATION;  Surgeon: Mora Bellman, MD;  Location: [place];  Service: Gynecology;  Laterality: N/A;  NEEDS ULTRASOUND GUIDANCE ANORA KIT SENT    HYSTEROSCOPY WITH RESECTOSCOPE N/A 04/28/2013   Procedure:  HYSTEROSCOPY ;  Surgeon: Princess Bruins, MD;  Location: [facility];  Service: Gynecology;  Laterality: N/A;   LAPAROSCOPY N/A 04/28/2013   Procedure: LAPAROSCOPY DIAGNOSTIC;  Surgeon: Princess Bruins, MD;  Location: [facility];  Service: Gynecology;  Laterality: N/A;  2 hrs. total   OPERATIVE ULTRASOUND N/A 12/07/2019   Procedure: OPERATIVE ULTRASOUND;  Surgeon: Mora Bellman, MD;  Location: [place];  Service: Gynecology;  Laterality: N/A;   WISDOM TOOTH EXTRACTION  28yr ago    OB History     Gravida  2   Para  1   Term  1   Preterm  0   AB  1   Living  1      SAB  1   IAB  0   Ectopic  0   Multiple  0   Live Births  1            Home Medications    Prior to Admission medications  Medication Sig Start Date End Date Taking? Authorizing Provider  benzonatate (TESSALON PERLES) 100 MG capsule Take 1 capsule (100 mg total) by mouth 3 (three) times daily as needed for cough. 10/05/21  Yes Pius Byrom L, PA  Lactic Ac-Citric Ac-Pot Bitart (PHEXXI) 1.8-1-0.4 % GEL Place 5 g vaginally as needed. 06/06/21   Leftwich-Kirby, Kathie Dike, CNM  Misc. Devices (BREAST PUMP) MISC 1 Device by Does not apply route daily. 02/06/21   Leftwich-Kirby, Kathie Dike, CNM  Prenatal Vit-Fe Fumarate-FA (MULTIVITAMIN-PRENATAL) 27-0.8 MG TABS tablet Take 1 tablet by mouth daily at 12 noon.    [provider]    Family History Family History  Problem Relation Age of Onset   Hypertension Mother    Diverticulitis Mother    Hyperlipidemia Mother    Arthritis Father    Rheum arthritis Paternal Grandmother     Social History Social History   Tobacco Use   Smoking  status: Former    Packs/day: 0.50    Years: 8.00    Total pack years: 4.00    Types: Cigarettes    Quit date: 08/2019    Years since quitting: 2.1   Smokeless tobacco: Former    Quit date: 01/02/2011  Vaping Use   Vaping Use: Never used  Substance Use Topics   Alcohol use: Yes    Alcohol/week: 1.0 standard drink of alcohol    Types: 1 Glasses of wine per week   Drug use: No     Allergies   Prozac [fluoxetine hcl], Citrus, Ibuprofen, and Percocet [oxycodone-acetaminophen]   Review of Systems Review of Systems  Respiratory:  Positive for cough.   As per HPI   Physical Exam Triage Vital Signs ED Triage Vitals  Enc Vitals Group     BP 10/05/21 1200 109/70     Pulse Rate 10/05/21 1200 96     Resp 10/05/21 1200 14     Temp 10/05/21 1200 99.7 F (37.6 C)     Temp Source 10/05/21 1200 Oral     SpO2 10/05/21 1200 96 %     Weight --      Height --      Head Circumference --      Peak Flow --      Pain Score 10/05/21 1159 5     Pain Loc --      Pain Edu? --      Excl. in [place]? --    No data found.  Updated Vital Signs BP 109/70 (BP Location: Right Arm)   Pulse 96   Temp 99.7 F (37.6 C) (Oral)   Resp 14   SpO2 96%   Breastfeeding Yes   Visual Acuity Right Eye Distance:   Left Eye Distance:   Bilateral Distance:    Right Eye Near:   Left Eye Near:    Bilateral Near:     Physical Exam Vitals and nursing note reviewed.  Constitutional:      General: She is not in acute distress.    Appearance: Normal appearance. She is well-developed. She is ill-appearing. She is not toxic-appearing or diaphoretic.  HENT:     Head: Normocephalic and atraumatic.     Salivary Glands: Right salivary gland is not diffusely enlarged or tender. Left salivary gland is not diffusely enlarged or tender.     Right Ear: Tympanic membrane, ear canal and external ear normal. There is no impacted cerumen.     Left Ear: Tympanic membrane, ear canal and external ear normal. There is  no  impacted cerumen.     Nose: Nose normal. No rhinorrhea.     Right Turbinates: Not enlarged or swollen.     Left Turbinates: Not enlarged or swollen.     Right Sinus: No maxillary sinus tenderness or frontal sinus tenderness.     Left Sinus: No maxillary sinus tenderness or frontal sinus tenderness.     Mouth/Throat:     Lips: Pink.     Mouth: Mucous membranes are moist. No injury, oral lesions or angioedema.     Pharynx: Oropharynx is clear. Uvula midline. Posterior oropharyngeal erythema (posterior pharynx only) present. No pharyngeal swelling, oropharyngeal exudate or uvula swelling.     Tonsils: No tonsillar exudate or tonsillar abscesses.  Eyes:     Conjunctiva/sclera: Conjunctivae normal.  Cardiovascular:     Rate and Rhythm: Normal rate and regular rhythm.     Heart sounds: No murmur heard. Pulmonary:     Effort: Pulmonary effort is normal. No respiratory distress.     Breath sounds: Normal breath sounds. No stridor. No wheezing, rhonchi or rales.  Abdominal:     Palpations: Abdomen is soft.     Tenderness: There is no abdominal tenderness.  Musculoskeletal:        General: No swelling.     Cervical back: Normal range of motion and neck supple. No rigidity or tenderness.  Lymphadenopathy:     Cervical: No cervical adenopathy.  Skin:    General: Skin is warm and dry.     Capillary Refill: Capillary refill takes less than 2 seconds.     Coloration: Skin is not jaundiced.     Findings: No erythema or rash.  Neurological:     General: No focal deficit present.     Mental Status: She is alert and oriented to person, place, and time.  Psychiatric:        Mood and Affect: Mood normal.      UC Treatments / Results  Labs (all labs ordered are listed, but only abnormal results are displayed) Labs Reviewed  CULTURE, GROUP A STREP [facility])  POCT RAPID STREP A (OFFICE)    EKG   Radiology No results found.  Procedures Procedures (including critical care  time)  Medications Ordered in UC Medications - No data to display  Initial Impression / Assessment and Plan / UC Course  I have reviewed the triage vital signs and the nursing notes.  Pertinent labs & imaging results that were available during my care of the patient were reviewed by me and considered in my medical decision making (see chart for details).     Viral URI with cough -patient exposed to her sick son who had similar symptoms recently.  Suspect this to be viral in nature.  Rapid strep negative.  We will send out culture for confirmation.  Patient has had success with Tylenol and ibuprofen.  We will do Tessalon Perles as needed for cough.  Supportive measures and increased hydration including rest recommended.   Final Clinical Impressions(s) / UC Diagnoses   Final diagnoses:  Viral URI with cough     Discharge Instructions      Your sore throat is likely related to a virus.  Your strep test is negative.  We will call with the results of the throat swab if it requires treatment with antibiotics. Please alternate tylenol with ibuprofen to help with the discomfort or fever. You may also try chloraseptic spray or cepacol lozenges. Monitor for fever >5 days or non-responsive to medications,  worsening symptoms, or rash which may warrant recheck in clinic.     ED Prescriptions     Medication Sig Dispense Auth. Provider   benzonatate (TESSALON PERLES) 100 MG capsule Take 1 capsule (100 mg total) by mouth 3 (three) times daily as needed for cough. 20 capsule Shalom Ware L, PA      PDMP not reviewed this encounter.   Chaney Malling, [place] 10/05/21 1239

## 2021-10-07 LAB — CULTURE, GROUP A STREP (THRC)

## 2021-10-09 ENCOUNTER — Encounter: Payer: Self-pay | Admitting: Emergency Medicine

## 2021-10-09 ENCOUNTER — Ambulatory Visit
Admission: EM | Admit: 2021-10-09 | Discharge: 2021-10-09 | Disposition: A | Discharge status: Home / Self Care | Payer: Commercial Managed Care - HMO | Attending: Urgent Care | Admitting: Urgent Care

## 2021-10-09 DIAGNOSIS — J22 Unspecified acute lower respiratory infection: Secondary | ICD-10-CM | POA: Diagnosis not present

## 2021-10-09 DIAGNOSIS — J040 Acute laryngitis: Secondary | ICD-10-CM | POA: Diagnosis not present

## 2021-10-09 DIAGNOSIS — R0982 Postnasal drip: Secondary | ICD-10-CM

## 2021-10-09 MED ORDER — AZITHROMYCIN 250 MG PO TABS: ORAL_TABLET | ORAL | 0 refills | Status: DC

## 2021-10-09 MED ORDER — BUDESONIDE 32 MCG/ACT NA SUSP: 2.0000 | Freq: Every day | NASAL | 0 refills | Status: DC

## 2021-10-09 MED ORDER — PREDNISONE 20 MG PO TABS: 40.0000 mg | ORAL_TABLET | Freq: Every day | ORAL | 0 refills | Status: AC

## 2021-10-09 NOTE — ED Triage Notes (Signed)
Pt has been coughing for about 1 week. She was seen Thursday and states she is feeling better but the cough is not improving.

## 2021-10-09 NOTE — Discharge Instructions (Signed)
Please start taking budesonide nasal spray as prescribed today. Should help with nasal congestion and postnasal drainage. You may continue taking the cough pill as needed. Please start taking azithromycin as prescribed as well. You should start noticing improvement over the next 24 to 48 hours.   If no significant improvement in 48 hours, please start taking the prednisone as well. Take it once daily int he morning. If your symptoms persist greater than an additional 5 days, please notify our office or return to clinic.

## 2021-10-13 NOTE — ED Provider Notes (Signed)
[name]    CSN: [id number] Arrival date & time: 10/09/21  1521      History   Chief Complaint Chief Complaint  Patient presents with   Cough    HPI Krista Lee is a 36 y.o. female.    Cough Pleasant 36yo female presents today due to worsening symptoms. Was seen initially on 10/05/21, had strep testing and culture performed due to congestion and sore throat at that time. Both were negative, pt sent home with tessalon pearles. Pt states the pills do not help to subside the cough, and now she is having a very harsh, persistent cough. Feels tight in the chest and has been having low grade fevers. Pt took ibuprofen prior to visit today. 24 hours ago pt started losing voice and is having a hard time talking. Pt admits that her sore throat resolved, but the cough, fevers and loss of voice are worrisome. Has not been taking much OTC meds due to concern with lactational safety. Son is also still sick. On abx for ear infection.  Past Medical History:  Diagnosis Date   ADD (attention deficit disorder)    Allergy    no meds   Anemia    as adolescent   Anxiety    Depression    GERD (gastroesophageal reflux disease)    doesn't take any meds for this, diet controlled   Headache(784.0)    Heart murmur    as a child - no problems   History of bronchitis    as a teenager, no problems as adult   History of migraine    can't remember when the last one was   Insomnia    no meds   Panic attacks    Symptomatic cholelithiasis 09/25/2011   UTI (urinary tract infection)     Patient Active Problem List   Diagnosis Date Noted   SVD (spontaneous vaginal delivery) 04/19/2021   Hypokalemia 04/06/2021   Ehlers-Danlos syndrome 02/06/2021   Missed abortion    Female infertility 08/17/2019    Past Surgical History:  Procedure Laterality Date   CHOLECYSTECTOMY  10/22/2011   Procedure: LAPAROSCOPIC CHOLECYSTECTOMY;  Surgeon: Harl Bowie, MD;  Location: [place];  Service:  General;  Laterality: N/A;   DILATION AND EVACUATION N/A 12/07/2019   Procedure: DILATATION AND EVACUATION;  Surgeon: Mora Bellman, MD;  Location: [place];  Service: Gynecology;  Laterality: N/A;  NEEDS ULTRASOUND GUIDANCE ANORA KIT SENT    HYSTEROSCOPY WITH RESECTOSCOPE N/A 04/28/2013   Procedure:  HYSTEROSCOPY ;  Surgeon: Princess Bruins, MD;  Location: [facility];  Service: Gynecology;  Laterality: N/A;   LAPAROSCOPY N/A 04/28/2013   Procedure: LAPAROSCOPY DIAGNOSTIC;  Surgeon: Princess Bruins, MD;  Location: [facility];  Service: Gynecology;  Laterality: N/A;  2 hrs. total   OPERATIVE ULTRASOUND N/A 12/07/2019   Procedure: OPERATIVE ULTRASOUND;  Surgeon: Mora Bellman, MD;  Location: [place];  Service: Gynecology;  Laterality: N/A;   WISDOM TOOTH EXTRACTION  33yr ago    OB History     Gravida  2   Para  1   Term  1   Preterm  0   AB  1   Living  1      SAB  1   IAB  0   Ectopic  0   Multiple  0   Live Births  1            Home Medications    Prior to Admission  medications   Medication Sig Start Date End Date Taking? Authorizing Provider  azithromycin (ZITHROMAX Z-PAK) 250 MG tablet Take two tabs PO x 1 dose now. Then one tab PO day 2-5. 10/09/21  Yes Samik Balkcom L, PA  budesonide (RHINOCORT AQUA) 32 MCG/ACT nasal spray Place 2 sprays into both nostrils daily. 10/09/21  Yes Arye Weyenberg L, PA  predniSONE (DELTASONE) 20 MG tablet Take 2 tablets (40 mg total) by mouth daily with breakfast for 4 days. 10/09/21 10/13/21 Yes Pattijo Juste L, PA  benzonatate (TESSALON PERLES) 100 MG capsule Take 1 capsule (100 mg total) by mouth 3 (three) times daily as needed for cough. 10/05/21   Colbie Danner L, PA  Lactic Ac-Citric Ac-Pot Bitart (PHEXXI) 1.8-1-0.4 % GEL Place 5 g vaginally as needed. 06/06/21   Leftwich-Kirby, Kathie Dike, CNM  Misc. Devices (BREAST PUMP) MISC 1 Device by Does not apply route daily. 02/06/21   Leftwich-Kirby, Kathie Dike,  CNM  Prenatal Vit-Fe Fumarate-FA (MULTIVITAMIN-PRENATAL) 27-0.8 MG TABS tablet Take 1 tablet by mouth daily at 12 noon.    [provider]    Family History Family History  Problem Relation Age of Onset   Hypertension Mother    Diverticulitis Mother    Hyperlipidemia Mother    Arthritis Father    Rheum arthritis Paternal Grandmother     Social History Social History   Tobacco Use   Smoking status: Former    Packs/day: 0.50    Years: 8.00    Total pack years: 4.00    Types: Cigarettes    Quit date: 08/2019    Years since quitting: 2.2   Smokeless tobacco: Former    Quit date: 01/02/2011  Vaping Use   Vaping Use: Never used  Substance Use Topics   Alcohol use: Yes    Alcohol/week: 1.0 standard drink of alcohol    Types: 1 Glasses of wine per week   Drug use: No     Allergies   Prozac [fluoxetine hcl], Citrus, Ibuprofen, and Percocet [oxycodone-acetaminophen]   Review of Systems Review of Systems  Respiratory:  Positive for cough.   As per HPI   Physical Exam Triage Vital Signs ED Triage Vitals  Enc Vitals Group     BP 10/09/21 1558 110/74     Pulse Rate 10/09/21 1558 88     Resp 10/09/21 1558 16     Temp 10/09/21 1558 98.3 F (36.8 C)     Temp Source 10/09/21 1558 Oral     SpO2 10/09/21 1558 98 %     Weight --      Height --      Head Circumference --      Peak Flow --      Pain Score 10/09/21 1559 0     Pain Loc --      Pain Edu? --      Excl. in [place]? --    No data found.  Updated Vital Signs BP 110/74 (BP Location: Right Arm)   Pulse 88   Temp 98.3 F (36.8 C) (Oral)   Resp 16   SpO2 98%   Breastfeeding Yes   Visual Acuity Right Eye Distance:   Left Eye Distance:   Bilateral Distance:    Right Eye Near:   Left Eye Near:    Bilateral Near:     Physical Exam Vitals and nursing note reviewed.  Constitutional:      General: She is not in acute distress.    Appearance: Normal appearance. She  is normal weight. She is  ill-appearing. She is not toxic-appearing or diaphoretic.  HENT:     Head: Normocephalic and atraumatic.     Comments: Hoarse, raspy nasal quality voice    Right Ear: Ear canal and external ear normal. There is no impacted cerumen.     Left Ear: Ear canal and external ear normal. There is no impacted cerumen.     Ears:     Comments: B TM erythematous/ injected without bulge or effusion    Nose: Congestion and rhinorrhea present.     Mouth/Throat:     Mouth: Mucous membranes are moist.     Pharynx: Oropharynx is clear. No oropharyngeal exudate or posterior oropharyngeal erythema.  Eyes:     General: No scleral icterus.       Right eye: No discharge.        Left eye: No discharge.     Extraocular Movements: Extraocular movements intact.     Conjunctiva/sclera: Conjunctivae normal.     Pupils: Pupils are equal, round, and reactive to light.  Cardiovascular:     Rate and Rhythm: Normal rate.     Pulses: Normal pulses.     Heart sounds: Normal heart sounds. No murmur heard.    No friction rub.  Pulmonary:     Effort: Pulmonary effort is normal. No respiratory distress.     Breath sounds: No stridor. Rhonchi present. No wheezing or rales.  Abdominal:     General: Abdomen is flat.     Tenderness: There is no abdominal tenderness.  Musculoskeletal:     Cervical back: Normal range of motion and neck supple. No rigidity or tenderness.  Lymphadenopathy:     Cervical: No cervical adenopathy.  Skin:    General: Skin is warm and dry.     Capillary Refill: Capillary refill takes less than 2 seconds.     Coloration: Skin is not jaundiced.     Findings: No bruising, erythema or rash.  Neurological:     General: No focal deficit present.     Mental Status: She is alert and oriented to person, place, and time.     Sensory: No sensory deficit.     Motor: No weakness.  Psychiatric:        Mood and Affect: Mood normal.        Behavior: Behavior normal.     UC Treatments / Results   Labs (all labs ordered are listed, but only abnormal results are displayed) Labs Reviewed - No data to display  EKG   Radiology No results found.  Procedures Procedures (including critical care time)  Medications Ordered in UC Medications - No data to display  Initial Impression / Assessment and Plan / UC Course  I have reviewed the triage vital signs and the nursing notes.  Pertinent labs & imaging results that were available during my care of the patient were reviewed by me and considered in my medical decision making (see chart for details).     Acute lower respiratory tract infection - pt has been ill for > one week with worsening symptoms concerning for mycoplasma pneumonia. Will start tx with azithro to cover for atypical respiratory tract flora. Laryngitis - likely secondary to post nasal drainage. Will do short burst of prednisone. Discussed lactational warnings with prednisone; okay for short course and low dose. Post nasal drainage - add budesonide nasal to help with PND. Saline spray and salt water gargles would also be beneficial.   Final Clinical Impressions(s) /  UC Diagnoses   Final diagnoses:  Acute lower respiratory infection  Laryngitis  Post-nasal drainage     Discharge Instructions      Please start taking budesonide nasal spray as prescribed today. Should help with nasal congestion and postnasal drainage. You may continue taking the cough pill as needed. Please start taking azithromycin as prescribed as well. You should start noticing improvement over the next 24 to 48 hours.   If no significant improvement in 48 hours, please start taking the prednisone as well. Take it once daily int he morning. If your symptoms persist greater than an additional 5 days, please notify our office or return to clinic.    ED Prescriptions     Medication Sig Dispense Auth. Provider   azithromycin (ZITHROMAX Z-PAK) 250 MG tablet Take two tabs PO x 1 dose now.  Then one tab PO day 2-5. 6 tablet Anacaren Kohan L, PA   budesonide (RHINOCORT AQUA) 32 MCG/ACT nasal spray Place 2 sprays into both nostrils daily. 5 mL Chaeli Judy L, PA   predniSONE (DELTASONE) 20 MG tablet Take 2 tablets (40 mg total) by mouth daily with breakfast for 4 days. 8 tablet Merlin Golden L, [place]      PDMP not reviewed this encounter.   Chaney Malling, [place] 10/13/21 [phone]

## 2021-10-16 ENCOUNTER — Ambulatory Visit: Payer: 59 | Admitting: Clinical

## 2021-10-30 ENCOUNTER — Ambulatory Visit: Payer: 59 | Admitting: Clinical

## 2021-11-13 ENCOUNTER — Ambulatory Visit: Payer: 59 | Admitting: Clinical

## 2021-11-27 ENCOUNTER — Ambulatory Visit: Payer: 59 | Admitting: Clinical

## 2021-12-11 ENCOUNTER — Ambulatory Visit: Payer: 59 | Admitting: Clinical

## 2022-01-08 ENCOUNTER — Ambulatory Visit (INDEPENDENT_AMBULATORY_CARE_PROVIDER_SITE_OTHER): Payer: Commercial Managed Care - HMO | Admitting: Clinical

## 2022-01-08 DIAGNOSIS — F411 Generalized anxiety disorder: Secondary | ICD-10-CM | POA: Diagnosis not present

## 2022-01-08 NOTE — Progress Notes (Signed)
Diagnosis: F41.1 Time: 62:03TD-97:41UL CPT Code: [id number]  Krista Lee was seen remotely using secure video conferencing. She was in her home in [place] and the therapist was in her office at the time of the appointment. She reflected upon updates since her most recent session in August, and shared a desire to continue to work on her ability to cope with her husband's expressions of emotion without becoming triggered by him. Therapist engaged her in a review and update of her treatment plan, and she provided input and consent to all updated goals. She is scheduled to be seen again in February.    Treatment Plan Client Abilities/Strengths  Krista Lee presented as insightful and motivated, with clear goals for therapy.  Client Treatment Preferences  Krista Lee would like to begin with monthly appointments, and increase frequency once she has hit her insurance deductible.  Client Statement of Needs  Krista Lee is seeking CBT to address anxiety and relationship challenges.  Treatment Level  Monthly  Symptoms  Anxiety : vivid, persistent thoughts of worst case scenarios that are difficult to disengage from (Status: maintained). Relational Challenges: Difficulty with mutual triggering of uncomfortable emotions in marriage (Status: maintained).  Problems Addressed  New Description, New Description  Goals 1. Difficulty in relationship with husband Objective Development of strategies to regulate emotional response to husband's stress Target Date: 2023-01-09 Frequency: Biweekly  Progress: 0 Modality: individual  Related Interventions Therapist will work with Krista Lee to develop communication strategies by helping her to consider how she might respond to different situations and offering opportunities to practice, such as through written exercises and role plays Therapist will work with Krista Lee to develop her communication strategies, including labeling and expressing her emotions and using "I" statements 2.  Persistent anxiety triggered by thoughts of worse case scenarios that feel real Objective Decrease in frequency and severity of anxiety, increase in overall well being Target Date: 2023-01-09 Frequency: Biweekly  Progress: 0 Modality: individual  Related Interventions Therapist will provide referrals for additional resources as appropriate Therapist will provide Krista Lee with strategies to regulate her emotions, including meditation, breathing exercises, mindfulness, and self-care. Therapist will help Krista Lee to identify and disengage from maladaptive thought patterns using CBT-based strategies Therapist will provide Krista Lee with opportunities to process her experiences in session Diagnosis Axis none 300.00 (Anxiety state, unspecified) - Open - [Signifier: n/a]    Conditions For Discharge Achievement of treatment goals and objectives          Myrtie Cruise, PhD               Myrtie Cruise, PhD               Myrtie Cruise, PhD               Myrtie Cruise, PhD

## 2022-02-05 ENCOUNTER — Ambulatory Visit (INDEPENDENT_AMBULATORY_CARE_PROVIDER_SITE_OTHER): Payer: Commercial Managed Care - HMO | Admitting: Clinical

## 2022-02-05 DIAGNOSIS — F411 Generalized anxiety disorder: Secondary | ICD-10-CM

## 2022-02-05 NOTE — Progress Notes (Signed)
Diagnosis: F41.1 Time: [id number] CPT Code: [id number]  Krista Lee was seen remotely using secure video conferencing. She was in her home in [place] and the therapist was in her office at the time of the appointment. She reported that her anxiety remains stable but she is concerned about a possible increase once she stops breast feeding in two months. Session focused on developments in her relationship. She is scheduled to be seen again in April.    Treatment Plan Client Abilities/Strengths  Edelyn presented as insightful and motivated, with clear goals for therapy.  Client Treatment Preferences  Laurelyn would like to begin with monthly appointments, and increase frequency once she has hit her insurance deductible.  Client Statement of Needs  Taliah is seeking CBT to address anxiety and relationship challenges.  Treatment Level  Monthly  Symptoms  Anxiety : vivid, persistent thoughts of worst case scenarios that are difficult to disengage from (Status: maintained). Relational Challenges: Difficulty with mutual triggering of uncomfortable emotions in marriage (Status: maintained).  Problems Addressed  New Description, New Description  Goals 1. Difficulty in relationship with husband Objective Development of strategies to regulate emotional response to husband's stress Target Date: 2023-01-09 Frequency: Biweekly  Progress: 0 Modality: individual  Related Interventions Therapist will work with [name] to develop communication strategies by helping her to consider how she might respond to different situations and offering opportunities to practice, such as through written exercises and role plays Therapist will work with [name] to develop her communication strategies, including labeling and expressing her emotions and using "I" statements 2. Persistent anxiety triggered by thoughts of worse case scenarios that feel real Objective Decrease in frequency and severity of anxiety, increase in  overall well being Target Date: 2023-01-09 Frequency: Biweekly  Progress: 0 Modality: individual  Related Interventions Therapist will provide referrals for additional resources as appropriate Therapist will provide Osha with strategies to regulate her emotions, including meditation, breathing exercises, mindfulness, and self-care. Therapist will help Maddix to identify and disengage from maladaptive thought patterns using CBT-based strategies Therapist will provide Aslynn with opportunities to process her experiences in session Diagnosis Axis none 300.00 (Anxiety state, unspecified) - Open - [Signifier: n/a]    Conditions For Discharge Achievement of treatment goals and objectives          Myrtie Cruise, PhD               Myrtie Cruise, PhD              Myrtie Cruise, PhD               Myrtie Cruise, PhD

## 2022-03-19 ENCOUNTER — Ambulatory Visit (INDEPENDENT_AMBULATORY_CARE_PROVIDER_SITE_OTHER): Payer: Commercial Managed Care - HMO | Admitting: Clinical

## 2022-03-19 DIAGNOSIS — F411 Generalized anxiety disorder: Secondary | ICD-10-CM | POA: Diagnosis not present

## 2022-03-19 NOTE — Progress Notes (Signed)
Diagnosis: F41.1 Time: [id number] CPT Code: [id number]  Krista Lee was seen remotely using secure video conferencing. She was in her home in [place] and the therapist was in her office at the time of the appointment. Session focused on developments in her relationship since her last session. Therapist offered psychoeducation and recommended couple's therapy. She also created a plan to step away to calm herself when her husband triggers her, and to write out "what actually happened," and "what went well." She is scheduled to be seen again in April.    Treatment Plan Client Abilities/Strengths  Krista Lee presented as insightful and motivated, with clear goals for therapy.  Client Treatment Preferences  Krista Lee would like to begin with monthly appointments, and increase frequency once she has hit her insurance deductible.  Client Statement of Needs  Krista Lee is seeking CBT to address anxiety and relationship challenges.  Treatment Level  Monthly  Symptoms  Anxiety : vivid, persistent thoughts of worst case scenarios that are difficult to disengage from (Status: maintained). Relational Challenges: Difficulty with mutual triggering of uncomfortable emotions in marriage (Status: maintained).  Problems Addressed  New Description, New Description  Goals 1. Difficulty in relationship with husband Objective Development of strategies to regulate emotional response to husband's stress Target Date: 2023-01-09 Frequency: Biweekly  Progress: 0 Modality: individual  Related Interventions Therapist will work with Krista Lee to develop communication strategies by helping her to consider how she might respond to different situations and offering opportunities to practice, such as through written exercises and role plays Therapist will work with Krista Lee to develop her communication strategies, including labeling and expressing her emotions and using "I" statements 2. Persistent anxiety triggered by thoughts of worse  case scenarios that feel real Objective Decrease in frequency and severity of anxiety, increase in overall well being Target Date: 2023-01-09 Frequency: Biweekly  Progress: 0 Modality: individual  Related Interventions Therapist will provide referrals for additional resources as appropriate Therapist will provide Krista Lee with strategies to regulate her emotions, including meditation, breathing exercises, mindfulness, and self-care. Therapist will help Krista Lee to identify and disengage from maladaptive thought patterns using CBT-based strategies Therapist will provide Krista Lee with opportunities to process her experiences in session Diagnosis Axis none 300.00 (Anxiety state, unspecified) - Open - [Signifier: n/a]    Conditions For Discharge Achievement of treatment goals and objectives   Myrtie Cruise, PhD               Myrtie Cruise, PhD

## 2022-04-02 ENCOUNTER — Ambulatory Visit (INDEPENDENT_AMBULATORY_CARE_PROVIDER_SITE_OTHER): Payer: Commercial Managed Care - HMO | Admitting: Clinical

## 2022-04-02 DIAGNOSIS — F411 Generalized anxiety disorder: Secondary | ICD-10-CM | POA: Diagnosis not present

## 2022-04-02 NOTE — Progress Notes (Signed)
Diagnosis: F41.1 Time: [id number] CPT Code: [id number]  Krista Lee was seen remotely using secure video conferencing. She was in her home in [place] and the therapist was in her office at the time of the appointment. Session focused on working toward self-regulation during moments when her husband becomes dysregulated. Therapist encouraged [name] to shift focus from attempting to regulate her husband's emotions and needs, by focusing on her own values (peace) and visualizing herself responding in her preferred manner. She is scheduled to be seen again in June.    Treatment Plan Client Abilities/Strengths  [name] presented as insightful and motivated, with clear goals for therapy.  Client Treatment Preferences  [name] would like to begin with monthly appointments, and increase frequency once she has hit her insurance deductible.  Client Statement of Needs  [name] is seeking CBT to address anxiety and relationship challenges.  Treatment Level  Monthly  Symptoms  Anxiety : vivid, persistent thoughts of worst case scenarios that are difficult to disengage from (Status: maintained). Relational Challenges: Difficulty with mutual triggering of uncomfortable emotions in marriage (Status: maintained).  Problems Addressed  New Description, New Description  Goals 1. Difficulty in relationship with husband Objective Development of strategies to regulate emotional response to husband's stress Target Date: 2023-01-09 Frequency: Biweekly  Progress: 0 Modality: individual  Related Interventions Therapist will work with [name] to develop communication strategies by helping her to consider how she might respond to different situations and offering opportunities to practice, such as through written exercises and role plays Therapist will work with [name] to develop her communication strategies, including labeling and expressing her emotions and using "I" statements 2. Persistent anxiety triggered by  thoughts of worse case scenarios that feel real Objective Decrease in frequency and severity of anxiety, increase in overall well being Target Date: 2023-01-09 Frequency: Biweekly  Progress: 0 Modality: individual  Related Interventions Therapist will provide referrals for additional resources as appropriate Therapist will provide [name] with strategies to regulate her emotions, including meditation, breathing exercises, mindfulness, and self-care. Therapist will help [name] to identify and disengage from maladaptive thought patterns using CBT-based strategies Therapist will provide [name] with opportunities to process her experiences in session Diagnosis Axis none 300.00 (Anxiety state, unspecified) - Open - [Signifier: n/a]    Conditions For Discharge Achievement of treatment goals and objectives     Myrtie Cruise, PhD               Myrtie Cruise, PhD

## 2022-05-14 ENCOUNTER — Ambulatory Visit: Payer: Commercial Managed Care - HMO | Admitting: Clinical

## 2022-06-11 ENCOUNTER — Ambulatory Visit: Payer: Commercial Managed Care - HMO | Admitting: Clinical

## 2022-07-09 ENCOUNTER — Ambulatory Visit (INDEPENDENT_AMBULATORY_CARE_PROVIDER_SITE_OTHER): Payer: Commercial Managed Care - HMO | Admitting: Clinical

## 2022-07-09 DIAGNOSIS — F411 Generalized anxiety disorder: Secondary | ICD-10-CM

## 2022-07-09 NOTE — Progress Notes (Signed)
Diagnosis: F41.1 Time: 10:03am-10:59am CPT Code: [id number]  Krista Lee was seen remotely using secure video conferencing. She was in her home in [place] and the therapist was in her office at the time of the appointment. Client is aware of risks of telehealth and consented to a virtual visit. Session focused on insecurity she experiences at work. Therapist processed this with her, encouraging her to explore the values that drive her in addition to more ego-driven goals. She uncovered a value of learning and growth, and therapist encouraged her to try viewing stressful situations at work as opportunities for growth. She is scheduled to be seen again in August.    Treatment Plan Client Abilities/Strengths  Krista Lee presented as insightful and motivated, with clear goals for therapy.  Client Treatment Preferences  Krista Lee would like to begin with monthly appointments, and increase frequency once she has hit her insurance deductible.  Client Statement of Needs  Krista Lee is seeking CBT to address anxiety and relationship challenges.  Treatment Level  Monthly  Symptoms  Anxiety : vivid, persistent thoughts of worst case scenarios that are difficult to disengage from (Status: maintained). Relational Challenges: Difficulty with mutual triggering of uncomfortable emotions in marriage (Status: maintained).  Problems Addressed  New Description, New Description  Goals 1. Difficulty in relationship with husband Objective Development of strategies to regulate emotional response to husband's stress Target Date: 2023-01-09 Frequency: Biweekly  Progress: 0 Modality: individual  Related Interventions Therapist will work with Krista Lee to develop communication strategies by helping her to consider how she might respond to different situations and offering opportunities to practice, such as through written exercises and role plays Therapist will work with Krista Lee to develop her communication strategies, including  labeling and expressing her emotions and using "I" statements 2. Persistent anxiety triggered by thoughts of worse case scenarios that feel real Objective Decrease in frequency and severity of anxiety, increase in overall well being Target Date: 2023-01-09 Frequency: Biweekly  Progress: 0 Modality: individual  Related Interventions Therapist will provide referrals for additional resources as appropriate Therapist will provide Krista Lee with strategies to regulate her emotions, including meditation, breathing exercises, mindfulness, and self-care. Therapist will help Krista Lee to identify and disengage from maladaptive thought patterns using CBT-based strategies Therapist will provide Krista Lee with opportunities to process her experiences in session Diagnosis Axis none 300.00 (Anxiety state, unspecified) - Open - [Signifier: n/a]    Conditions For Discharge Achievement of treatment goals and objectives     Krista Noa, Krista Lee               Krista Noa, Krista Lee               Krista Noa, Krista Lee

## 2022-07-30 ENCOUNTER — Ambulatory Visit (INDEPENDENT_AMBULATORY_CARE_PROVIDER_SITE_OTHER): Payer: Commercial Managed Care - HMO | Admitting: Orthopaedic Surgery

## 2022-07-30 ENCOUNTER — Other Ambulatory Visit (INDEPENDENT_AMBULATORY_CARE_PROVIDER_SITE_OTHER): Payer: Commercial Managed Care - HMO

## 2022-07-30 ENCOUNTER — Other Ambulatory Visit: Payer: Self-pay

## 2022-07-30 ENCOUNTER — Encounter: Payer: Self-pay | Admitting: Orthopaedic Surgery

## 2022-07-30 DIAGNOSIS — M542 Cervicalgia: Secondary | ICD-10-CM

## 2022-07-30 NOTE — Progress Notes (Signed)
Krista Lee is a very well-known to me.  We just have not seen her as a patient in a long period of time.  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  She has been dealing with neck pain for about a month now and does radiate into her shoulders and she points to the lower cervical spine as a source of her pain.  She has had no known injury.  She does a lot of repetitive work and sees multiple clients on a daily basis.  She denies any radicular symptoms at all in her upper or lower extremities.  She does not tolerate anti-inflammatories well orally due to GI issues.  She has had no other acute change in her medical status.  On exam she has 5 out of 5 strength of her bilateral upper extremities with no radicular pain and no numbness and tingling.  Her cervical spine has full range of motion but there is stiffness more to the left than the right.  She does have midline pain over the lower cervical spine and upper thoracic spine and the does cause some stiffness in the paraspinal muscles.  2 views of the cervical spine show well-maintained disc height and alignment with no acute findings.  Certainly her work which is repetitive is contributed to probably her posture being not as good as it can be and this is caused her to have some pain around her cervical spine.  I recommended topical Voltaren gel and would like to send her to for a few physical therapy sessions as an outpatient physical therapy with any modalities per the therapist discretion.  They can look at her posture as a relates to her work and providing recommendations for home exercise and stretching program as well.  She agrees with this treatment plan.  All questions and concerns were addressed and answered.

## 2022-08-06 ENCOUNTER — Ambulatory Visit: Payer: Commercial Managed Care - HMO | Admitting: Clinical

## 2022-08-13 ENCOUNTER — Ambulatory Visit: Payer: Commercial Managed Care - HMO | Attending: Orthopaedic Surgery | Admitting: Physical Therapy

## 2022-08-13 ENCOUNTER — Other Ambulatory Visit: Payer: Self-pay

## 2022-08-13 DIAGNOSIS — R252 Cramp and spasm: Secondary | ICD-10-CM | POA: Insufficient documentation

## 2022-08-13 DIAGNOSIS — R293 Abnormal posture: Secondary | ICD-10-CM | POA: Diagnosis present

## 2022-08-13 DIAGNOSIS — M542 Cervicalgia: Secondary | ICD-10-CM | POA: Diagnosis present

## 2022-08-13 DIAGNOSIS — M25651 Stiffness of right hip, not elsewhere classified: Secondary | ICD-10-CM

## 2022-08-13 DIAGNOSIS — M6281 Muscle weakness (generalized): Secondary | ICD-10-CM | POA: Diagnosis present

## 2022-08-13 DIAGNOSIS — M25511 Pain in right shoulder: Secondary | ICD-10-CM | POA: Diagnosis present

## 2022-08-13 DIAGNOSIS — M25512 Pain in left shoulder: Secondary | ICD-10-CM | POA: Insufficient documentation

## 2022-08-13 NOTE — Therapy (Unsigned)
OUTPATIENT PHYSICAL THERAPY CERVICAL EVALUATION   [name] MRN: [id number] DOB:Jan 30, 1985, 37 y.o., female Today's Date: 08/14/2022  END OF SESSION:  PT End of Session - 08/14/22 1603     Visit Number 1    Number of Visits 6    Date for PT Re-Evaluation 10/09/22    Authorization Type [employer]    PT Start Time 1615    PT Stop Time 1700    PT Time Calculation (min) 45 min             Past Medical History:  Diagnosis Date   ADD (attention deficit disorder)    Allergy    no meds   Anemia    as adolescent   Anxiety    Depression    GERD (gastroesophageal reflux disease)    doesn't take any meds for this, diet controlled   Headache(784.0)    Heart murmur    as a child - no problems   History of bronchitis    as a teenager, no problems as adult   History of migraine    can't remember when the last one was   Insomnia    no meds   Panic attacks    Symptomatic cholelithiasis 09/25/2011   UTI (urinary tract infection)    Past Surgical History:  Procedure Laterality Date   CHOLECYSTECTOMY  10/22/2011   Procedure: LAPAROSCOPIC CHOLECYSTECTOMY;  Surgeon: Shelly Rubenstein, MD;  Location: [facility];  Service: General;  Laterality: N/A;   DILATION AND EVACUATION N/A 12/07/2019   Procedure: DILATATION AND EVACUATION;  Surgeon: Catalina Antigua, MD;  Location: [facility];  Service: Gynecology;  Laterality: N/A;  NEEDS ULTRASOUND GUIDANCE ANORA KIT SENT    HYSTEROSCOPY WITH RESECTOSCOPE N/A 04/28/2013   Procedure:  HYSTEROSCOPY ;  Surgeon: Genia Del, MD;  Location: [facility];  Service: Gynecology;  Laterality: N/A;   LAPAROSCOPY N/A 04/28/2013   Procedure: LAPAROSCOPY DIAGNOSTIC;  Surgeon: Genia Del, MD;  Location: [facility];  Service: Gynecology;  Laterality: N/A;  2 hrs. 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 This has been increasing as of June. Pt reports forward head posture. Pt states she gets tension in her shoulders and neck. Has tried to strengthen her shoulders and back. Notes increased forward shoulder at baseline. Wants ideas of exercises to perform -- has been looking at exercises on youtube. Does have a small child that she picks up at home. Has been trying pec stretches at home. Has been trying pilates for strengthening. Hand dominance: Right  PERTINENT HISTORY:  Carpal tunnel L hand  PAIN:  Are you having pain? Yes: NPRS scale: 2 currently, at worst 6/10 Pain location: lower cervical, top of shoulders with cervical movement Pain description: Irritating, dull Aggravating factors: certain movements reaching and lifting Relieving factors: stretching  PRECAUTIONS: None  RED FLAGS: None   WEIGHT BEARING RESTRICTIONS: No  FALLS:  Has patient fallen in last 6 months? No  LIVING ENVIRONMENT: Lives with: lives with their  family Lives in: House/apartment  OCCUPATION: Hair stylist  PLOF: Independent  PATIENT GOALS: Wants exercises to help address posture/neck/shoulders  NEXT MD VISIT: n/a  OBJECTIVE:   DIAGNOSTIC FINDINGS:  N/a  PATIENT SURVEYS:  FOTO 75; predicted 80 visit #9  COGNITION: Overall cognitive status: Within functional limits for tasks assessed  SENSATION: WFL  POSTURE: rounded shoulders, forward head, and decreased thoracic kyphosis  PALPATION: Increased muscle spasm bilat UTs, tender with C7 PAs   CERVICAL ROM:   Active ROM A/PROM (deg) eval  Flexion 45  Extension 60  Right lateral flexion 35  Left lateral flexion 35*  Right rotation 50  Left rotation 50   (Blank rows = not tested) * = pain  UPPER EXTREMITY ROM:  Active ROM Right eval Left eval  Shoulder flexion    Shoulder extension    Shoulder abduction    Shoulder adduction    Shoulder extension    Shoulder internal rotation    Shoulder external rotation    Elbow flexion    Elbow extension    Wrist flexion    Wrist extension    Wrist ulnar deviation    Wrist radial deviation    Wrist pronation    Wrist supination     (Blank rows = not tested)  UPPER EXTREMITY MMT:  MMT Right eval Left eval  Shoulder flexion 4 4  Shoulder abduction 4+ 4+  Shoulder adduction    Shoulder extension    Shoulder internal rotation 5 5  Shoulder external rotation 3+ 3+  Middle trapezius 3+ 3+  Lower trapezius 3- 2+  Elbow flexion    Elbow extension    Wrist flexion    Wrist extension    Wrist ulnar deviation    Wrist radial deviation    Wrist pronation    Wrist supination    Grip strength     (Blank rows = not tested)  CERVICAL SPECIAL TESTS:  Neck flexor muscle endurance test: Positive  FUNCTIONAL TESTS:  Did not assess  TODAY'S TREATMENT:  DATE: 08/13/22 See HEP below    PATIENT EDUCATION:  Education details: Exam findings, mechanics with her midback strengthening to avoid upper trap overactivity and pec compensation, initial HEP Person educated: Patient Education method: Explanation, Demonstration, and Handouts Education comprehension: verbalized understanding, returned demonstration, and needs further education  HOME EXERCISE PROGRAM: Access Code: [email or web address] URL: [email or web address] Date: 08/13/2022 Prepared by: Vernon Prey April Kirstie Peri  Exercises - Shoulder External Rotation and Scapular Retraction with Resistance  - 1 x daily - 7 x weekly - 2 sets - 10 reps - Shoulder W - External Rotation with Resistance  - 1 x daily - 7 x weekly - 2 sets - 10 reps - Standing Shoulder Horizontal Abduction with Resistance  - 1 x daily - 7 x weekly - 3 sets - 10 reps - Low Trap Setting at Wall  - 1 x daily - 7 x weekly - 2 sets - 10 reps - Standing Shoulder Row with Anchored Resistance  - 1 x daily - 7 x weekly - 2 sets - 10 reps - Shoulder extension with resistance - Neutral  - 1 x daily - 7 x weekly - 2 sets - 10 reps  ASSESSMENT:  CLINICAL IMPRESSION: Patient is a 37 y.o. F who was seen today for physical therapy evaluation and treatment for cervicalgia. Assessment significant for forward head and rounded shoulders, weak posterior shoulder girdle and muscular tightness in upper traps and C7. Pt demos upper trap compensation and overactivity with overhead movements and will benefit from PT. Pt would benefit from PT to address these deficits for improved tolerance to all work and home tasks.    OBJECTIVE IMPAIRMENTS: decreased activity tolerance, decreased mobility, difficulty walking, decreased ROM, decreased strength, increased fascial restrictions, increased muscle spasms, impaired UE functional use, improper body mechanics, postural dysfunction, and pain.   ACTIVITY LIMITATIONS: carrying, lifting, standing, dressing, reach over head,  hygiene/grooming, and caring for others  PARTICIPATION LIMITATIONS: meal prep, cleaning, laundry, community activity, and occupation  PERSONAL FACTORS: Age, Fitness, Past/current experiences, Profession, and Time since onset of injury/illness/exacerbation are also affecting patient's functional outcome.   REHAB POTENTIAL: Good  CLINICAL DECISION MAKING: Stable/uncomplicated  EVALUATION COMPLEXITY: Low   GOALS: Goals reviewed with patient? Yes  SHORT TERM GOALS: Target date: 09/11/2022   Pt will be ind with initial HEP Baseline:  Goal status: INITIAL  2.  Pt will report full pain and tension free cervical ROM Baseline:  Goal status: INITIAL   LONG TERM GOALS: Target date: 10/09/2022   Pt will be ind with management and progression of HEP Baseline:  Goal status: INITIAL  2.  Pt will report decrease in pain by >/=50% with all activities Baseline:  Goal status: INITIAL  3.  Pt will have improved FOTO score to 80 Baseline:  Goal status: INITIAL  4.  Pt will demo at least 4/5 midback strength for improved postural stability Baseline:  Goal status: INITIAL  5.  Pt will be able to reach and lift at least 25# to pick up and carry her child without pain exacerbation Baseline:  Goal status: INITIAL    PLAN:  PT FREQUENCY:  1x/wk initially and then transition to every other week due to financial constraints  PT DURATION: 8 weeks  PLANNED INTERVENTIONS: Therapeutic exercises, Therapeutic activity, Neuromuscular re-education, Balance training, Gait training, Patient/Family education, Self Care, Joint mobilization, Dry Needling, Electrical stimulation, Spinal mobilization, Cryotherapy, Moist heat, Taping, Vasopneumatic device, Ultrasound, Ionotophoresis 4mg /ml Dexamethasone, Manual therapy, and Re-evaluation  PLAN FOR NEXT SESSION: Assess  response to HEP. Continue to work on postural stability/posterior shoulder strengthening. Watch for compensatory movements and address  accordingly. Manual work if indicated.   Deen Deguia April Ma L Dinari Stgermaine, PT 08/14/2022, 4:04 PM

## 2022-08-20 ENCOUNTER — Ambulatory Visit: Payer: Commercial Managed Care - HMO

## 2022-08-20 DIAGNOSIS — R293 Abnormal posture: Secondary | ICD-10-CM

## 2022-08-20 DIAGNOSIS — M25511 Pain in right shoulder: Secondary | ICD-10-CM

## 2022-08-20 DIAGNOSIS — M542 Cervicalgia: Secondary | ICD-10-CM

## 2022-08-20 NOTE — Therapy (Signed)
OUTPATIENT PHYSICAL THERAPY CERVICAL EVALUATION   [name] MRN: [id number] DOB:August 07, 1985, 37 y.o., female Today's Date: 08/20/2022  END OF SESSION:  PT End of Session - 08/20/22 1013     Visit Number 2    Date for PT Re-Evaluation 10/09/22    Authorization Type [employer]    PT Start Time 0935    PT Stop Time 1015    PT Time Calculation (min) 40 min    Activity Tolerance Patient tolerated treatment well    Behavior During Therapy [facility] for tasks assessed/performed              Past Medical History:  Diagnosis Date   ADD (attention deficit disorder)    Allergy    no meds   Anemia    as adolescent   Anxiety    Depression    GERD (gastroesophageal reflux disease)    doesn't take any meds for this, diet controlled   Headache(784.0)    Heart murmur    as a child - no problems   History of bronchitis    as a teenager, no problems as adult   History of migraine    can't remember when the last one was   Insomnia    no meds   Panic attacks    Symptomatic cholelithiasis 09/25/2011   UTI (urinary tract infection)    Past Surgical History:  Procedure Laterality Date   CHOLECYSTECTOMY  10/22/2011   Procedure: LAPAROSCOPIC CHOLECYSTECTOMY;  Surgeon: Shelly Rubenstein, MD;  Location: [facility];  Service: General;  Laterality: N/A;   DILATION AND EVACUATION N/A 12/07/2019   Procedure: DILATATION AND EVACUATION;  Surgeon: Catalina Antigua, MD;  Location: [facility];  Service: Gynecology;  Laterality: N/A;  NEEDS ULTRASOUND GUIDANCE ANORA KIT SENT    HYSTEROSCOPY WITH RESECTOSCOPE N/A 04/28/2013   Procedure:  HYSTEROSCOPY ;  Surgeon: Genia Del, MD;  Location: [facility];  Service: Gynecology;  Laterality: N/A;   LAPAROSCOPY N/A 04/28/2013   Procedure: LAPAROSCOPY DIAGNOSTIC;  Surgeon: Genia Del, MD;  Location: [facility];  Service: Gynecology;  Laterality: N/A;  2 hrs. total   OPERATIVE ULTRASOUND N/A 12/07/2019   Procedure: OPERATIVE  ULTRASOUND;  Surgeon: Catalina Antigua, MD;  Location: [facility];  Service: Gynecology;  Laterality: N/A;   WISDOM TOOTH EXTRACTION  26yrs ago   Patient Active Problem List   Diagnosis Date Noted   SVD (spontaneous vaginal delivery) 04/19/2021   Hypokalemia 04/06/2021   Ehlers-Danlos syndrome 02/06/2021   Missed abortion    Female infertility 08/17/2019    PCP: None indicated on file  REFERRING PROVIDER: Kathryne Hitch, MD  REFERRING DIAG: M54.2 (ICD-10-CM) - Cervicalgia  THERAPY DIAG:  Abnormal posture  Cervicalgia  Bilateral shoulder pain, unspecified chronicity  Rationale for Evaluation and Treatment: Rehabilitation  ONSET DATE: May/June 2024  SUBJECTIVE:  SUBJECTIVE STATEMENT:  I was able to do my exercises at home. I am trying to keep my shoulder blades relaxed.   Hand dominance: Right  PERTINENT HISTORY:  Carpal tunnel L hand  PAIN:  Are you having pain? Yes: NPRS scale: 0/10 Pain location: lower cervical, top of shoulders with cervical movement Pain description: Irritating, dull Aggravating factors: certain movements reaching and lifting Relieving factors: stretching  PRECAUTIONS: None  RED FLAGS: None   WEIGHT BEARING RESTRICTIONS: No  FALLS:  Has patient fallen in last 6 months? No  LIVING ENVIRONMENT: Lives with: lives with their family Lives in: House/apartment  OCCUPATION: Hair stylist  PLOF: Independent  PATIENT GOALS: Wants exercises to help address posture/neck/shoulders  NEXT MD VISIT: n/a  OBJECTIVE:   DIAGNOSTIC FINDINGS:  N/a  PATIENT SURVEYS:  FOTO 75; predicted 80 visit #9  COGNITION: Overall cognitive status: Within functional limits for tasks assessed  SENSATION: WFL  POSTURE: rounded shoulders,  forward head, and decreased thoracic kyphosis  PALPATION: Increased muscle spasm bilat UTs, tender with C7 PAs   CERVICAL ROM:   Active ROM A/PROM (deg) eval  Flexion 45  Extension 60  Right lateral flexion 35  Left lateral flexion 35*  Right rotation 50  Left rotation 50   (Blank rows = not tested) * = pain  UPPER EXTREMITY ROM:  Active ROM Right eval Left eval  Shoulder flexion    Shoulder extension    Shoulder abduction    Shoulder adduction    Shoulder extension    Shoulder internal rotation    Shoulder external rotation    Elbow flexion    Elbow extension    Wrist flexion    Wrist extension    Wrist ulnar deviation    Wrist radial deviation    Wrist pronation    Wrist supination     (Blank rows = not tested)  UPPER EXTREMITY MMT:  MMT Right eval Left eval  Shoulder flexion 4 4  Shoulder abduction 4+ 4+  Shoulder adduction    Shoulder extension    Shoulder internal rotation 5 5  Shoulder external rotation 3+ 3+  Middle trapezius 3+ 3+  Lower trapezius 3- 2+  Elbow flexion    Elbow extension    Wrist flexion    Wrist extension    Wrist ulnar deviation    Wrist radial deviation    Wrist pronation    Wrist supination    Grip strength     (Blank rows = not tested)  CERVICAL SPECIAL TESTS:  Neck flexor muscle endurance test: Positive  FUNCTIONAL TESTS:  Did not assess  TODAY'S TREATMENT:            DATE: 08/20/22 Review of HEP Standing rows and pull downs with extension- red band 2x10 ER "W", horizontal abduction 2x10     Trigger Point Dry-Needling  Treatment instructions: Expect mild to moderate muscle soreness. S/S of pneumothorax if dry needled over a lung field, and to seek immediate medical attention should they occur. Patient verbalized understanding of these instructions and education.  Patient Consent Given: Yes Education handout provided: Previously provided Muscles treated: bil cervical and thoracic multifidi, bil upper traps and  rhomboids  Treatment response/outcome: Utilized skilled palpation to identify trigger points.  During dry needling able to palpate muscle twitch and muscle elongation  Elongation and release to bil neck and thoracic spine  Skilled palpation and monitoring by PT during dry needling  DATE: 08/13/22 See HEP below   PATIENT EDUCATION:  Education details: Exam findings, mechanics with her midback strengthening to avoid upper trap overactivity and pec compensation, initial HEP Person educated: Patient Education method: Explanation, Demonstration, and Handouts Education comprehension: verbalized understanding, returned demonstration, and needs further education  HOME EXERCISE PROGRAM: Access Code: [email or web address] URL: [email or web address] Date: 08/13/2022 Prepared by: Vernon Prey April Kirstie Peri  Exercises - Shoulder External Rotation and Scapular Retraction with Resistance  - 1 x daily - 7 x weekly - 2 sets - 10 reps - Shoulder W - External Rotation with Resistance  - 1 x daily - 7 x weekly - 2 sets - 10 reps - Standing Shoulder Horizontal Abduction with Resistance  - 1 x daily - 7 x weekly - 3 sets - 10 reps - Low Trap Setting at Wall  - 1 x daily - 7 x weekly - 2 sets - 10 reps - Standing Shoulder Row with Anchored Resistance  - 1 x daily - 7 x weekly - 2 sets - 10 reps - Shoulder extension with resistance - Neutral  - 1 x daily - 7 x weekly - 2 sets - 10 reps  ASSESSMENT:  CLINICAL IMPRESSION: First time follow-up after evaluation.  Pt has been independent and compliant with HEP. Pt was able to demonstrate with good form and required minor cues for scapular depression and to reduce upper trap activation.  Pt is making postural corrections with home and work tasks.  Pt with good response to DN with improved tissue mobility and reduce stiffness after manual therapy.  Pt would  benefit from PT to address these deficits for improved tolerance to all work and home tasks.    OBJECTIVE IMPAIRMENTS: decreased activity tolerance, decreased mobility, difficulty walking, decreased ROM, decreased strength, increased fascial restrictions, increased muscle spasms, impaired UE functional use, improper body mechanics, postural dysfunction, and pain.   ACTIVITY LIMITATIONS: carrying, lifting, standing, dressing, reach over head, hygiene/grooming, and caring for others  PARTICIPATION LIMITATIONS: meal prep, cleaning, laundry, community activity, and occupation  PERSONAL FACTORS: Age, Fitness, Past/current experiences, Profession, and Time since onset of injury/illness/exacerbation are also affecting patient's functional outcome.   REHAB POTENTIAL: Good  CLINICAL DECISION MAKING: Stable/uncomplicated  EVALUATION COMPLEXITY: Low   GOALS: Goals reviewed with patient? Yes  SHORT TERM GOALS: Target date: 09/11/2022   Pt will be ind with initial HEP Baseline:  Goal status: INITIAL  2.  Pt will report full pain and tension free cervical ROM Baseline:  Goal status: INITIAL   LONG TERM GOALS: Target date: 10/09/2022   Pt will be ind with management and progression of HEP Baseline:  Goal status: INITIAL  2.  Pt will report decrease in pain by >/=50% with all activities Baseline:  Goal status: INITIAL  3.  Pt will have improved FOTO score to 80 Baseline:  Goal status: INITIAL  4.  Pt will demo at least 4/5 midback strength for improved postural stability Baseline:  Goal status: INITIAL  5.  Pt will be able to reach and lift at least 25# to pick up and carry her child without pain exacerbation Baseline:  Goal status: INITIAL    PLAN:  PT FREQUENCY:  1x/wk initially and then transition to every other week due to financial constraints  PT DURATION: 8 weeks  PLANNED INTERVENTIONS: Therapeutic exercises, Therapeutic activity, Neuromuscular re-education,  Balance training, Gait training, Patient/Family education, Self Care, Joint mobilization, Dry Needling, Electrical stimulation, Spinal mobilization, Cryotherapy, Moist heat, Taping, Vasopneumatic device, Ultrasound, Ionotophoresis 4mg /ml  Dexamethasone, Manual therapy, and Re-evaluation  PLAN FOR NEXT SESSION: Assess response to DN,  Continue to work on postural stability/posterior shoulder strengthening. Watch for compensatory movements and address accordingly. Manual work if indicated.   Lorrene Reid, PT 08/20/22 10:19 AM

## 2022-08-27 ENCOUNTER — Ambulatory Visit: Payer: Commercial Managed Care - HMO

## 2022-08-27 DIAGNOSIS — M542 Cervicalgia: Secondary | ICD-10-CM

## 2022-08-27 DIAGNOSIS — R293 Abnormal posture: Secondary | ICD-10-CM | POA: Diagnosis not present

## 2022-08-27 DIAGNOSIS — R252 Cramp and spasm: Secondary | ICD-10-CM

## 2022-08-27 DIAGNOSIS — M6281 Muscle weakness (generalized): Secondary | ICD-10-CM

## 2022-08-27 DIAGNOSIS — M25511 Pain in right shoulder: Secondary | ICD-10-CM

## 2022-08-27 NOTE — Therapy (Addendum)
OUTPATIENT PHYSICAL THERAPY CERVICAL TREATMENT   [name] MRN: [id number] DOB:Mar 17, 1985, 37 y.o., female Today's Date: 08/27/2022  END OF SESSION:  PT End of Session - 08/27/22 1138     Visit Number 3    Date for PT Re-Evaluation 10/09/22    Authorization Type [employer]    PT Start Time 1105    PT Stop Time 1139    PT Time Calculation (min) 34 min    Activity Tolerance Patient tolerated treatment well    Behavior During Therapy WFL for tasks assessed/performed               Past Medical History:  Diagnosis Date   ADD (attention deficit disorder)    Allergy    no meds   Anemia    as adolescent   Anxiety    Depression    GERD (gastroesophageal reflux disease)    doesn't take any meds for this, diet controlled   Headache(784.0)    Heart murmur    as a child - no problems   History of bronchitis    as a teenager, no problems as adult   History of migraine    can't remember when the last one was   Insomnia    no meds   Panic attacks    Symptomatic cholelithiasis 09/25/2011   UTI (urinary tract infection)    Past Surgical History:  Procedure Laterality Date   CHOLECYSTECTOMY  10/22/2011   Procedure: LAPAROSCOPIC CHOLECYSTECTOMY;  Surgeon: Shelly Rubenstein, MD;  Location: [facility];  Service: General;  Laterality: N/A;   DILATION AND EVACUATION N/A 12/07/2019   Procedure: DILATATION AND EVACUATION;  Surgeon: Catalina Antigua, MD;  Location: [facility];  Service: Gynecology;  Laterality: N/A;  NEEDS ULTRASOUND GUIDANCE ANORA KIT SENT    HYSTEROSCOPY WITH RESECTOSCOPE N/A 04/28/2013   Procedure:  HYSTEROSCOPY ;  Surgeon: Genia Del, MD;  Location: [facility];  Service: Gynecology;  Laterality: N/A;   LAPAROSCOPY N/A 04/28/2013   Procedure: LAPAROSCOPY DIAGNOSTIC;  Surgeon: Genia Del, MD;  Location: [facility];  Service: Gynecology;  Laterality: N/A;  2 hrs. total   OPERATIVE ULTRASOUND N/A 12/07/2019   Procedure: OPERATIVE  ULTRASOUND;  Surgeon: Catalina Antigua, MD;  Location: [facility];  Service: Gynecology;  Laterality: N/A;   WISDOM TOOTH EXTRACTION  59yrs ago   Patient Active Problem List   Diagnosis Date Noted   SVD (spontaneous vaginal delivery) 04/19/2021   Hypokalemia 04/06/2021   Ehlers-Danlos syndrome 02/06/2021   Missed abortion    Female infertility 08/17/2019    PCP: None indicated on file  REFERRING PROVIDER: Kathryne Hitch, MD  REFERRING DIAG: M54.2 (ICD-10-CM) - Cervicalgia  THERAPY DIAG:  Abnormal posture  Cervicalgia  Bilateral shoulder pain, unspecified chronicity  Cramp and spasm  Muscle weakness (generalized)  Rationale for Evaluation and Treatment: Rehabilitation  ONSET DATE: May/June 2024  SUBJECTIVE:  SUBJECTIVE STATEMENT:  I had 4-5 days of relief after last session.  I know I need to get stronger to make this better long term.     Hand dominance: Right  PERTINENT HISTORY:  Carpal tunnel L hand  PAIN:  Are you having pain? Yes: NPRS scale: 4/10 Pain location: lower cervical, top of shoulders with cervical movement Pain description: Irritating, dull Aggravating factors: certain movements reaching and lifting Relieving factors: stretching  PRECAUTIONS: None  RED FLAGS: None   WEIGHT BEARING RESTRICTIONS: No  FALLS:  Has patient fallen in last 6 months? No  LIVING ENVIRONMENT: Lives with: lives with their family Lives in: House/apartment  OCCUPATION: Hair stylist  PLOF: Independent  PATIENT GOALS: Wants exercises to help address posture/neck/shoulders  NEXT MD VISIT: n/a  OBJECTIVE:   DIAGNOSTIC FINDINGS:  N/a  PATIENT SURVEYS:  FOTO 75; predicted 80 visit #9  COGNITION: Overall cognitive status: Within functional  limits for tasks assessed  SENSATION: WFL  POSTURE: rounded shoulders, forward head, and decreased thoracic kyphosis  PALPATION: Increased muscle spasm bilat UTs, tender with C7 PAs   CERVICAL ROM:   Active ROM A/PROM (deg) eval  Flexion 45  Extension 60  Right lateral flexion 35  Left lateral flexion 35*  Right rotation 50  Left rotation 50   (Blank rows = not tested) * = pain  UPPER EXTREMITY ROM:  Active ROM Right eval Left eval  Shoulder flexion    Shoulder extension    Shoulder abduction    Shoulder adduction    Shoulder extension    Shoulder internal rotation    Shoulder external rotation    Elbow flexion    Elbow extension    Wrist flexion    Wrist extension    Wrist ulnar deviation    Wrist radial deviation    Wrist pronation    Wrist supination     (Blank rows = not tested)  UPPER EXTREMITY MMT:  MMT Right eval Left eval  Shoulder flexion 4 4  Shoulder abduction 4+ 4+  Shoulder adduction    Shoulder extension    Shoulder internal rotation 5 5  Shoulder external rotation 3+ 3+  Middle trapezius 3+ 3+  Lower trapezius 3- 2+  Elbow flexion    Elbow extension    Wrist flexion    Wrist extension    Wrist ulnar deviation    Wrist radial deviation    Wrist pronation    Wrist supination    Grip strength     (Blank rows = not tested)  CERVICAL SPECIAL TESTS:  Neck flexor muscle endurance test: Positive  FUNCTIONAL TESTS:  Did not assess  TODAY'S TREATMENT:       DATE: 08/27/22 Review of HEP Standing rows and pull downs with extension- red band 2x10 ER "W", horizontal abduction 2x10     Trigger Point Dry-Needling  Treatment instructions: Expect mild to moderate muscle soreness. S/S of pneumothorax if dry needled over a lung field, and to seek immediate medical attention should they occur. Patient verbalized understanding of these instructions and education.  Patient Consent Given: Yes Education handout provided: Previously  provided Muscles treated: bil cervical and thoracic multifidi, bil upper traps and rhomboids  Treatment response/outcome: Utilized skilled palpation to identify trigger points.  During dry needling able to palpate muscle twitch and muscle elongation  Elongation and release to bil neck and thoracic spine  Skilled palpation and monitoring by PT during dry needling  DATE: 08/20/22 Review of HEP Standing rows and pull downs with extension- red band 2x10 ER "W", horizontal abduction 2x10     Trigger Point Dry-Needling  Treatment instructions: Expect mild to moderate muscle soreness. S/S of pneumothorax if dry needled over a lung field, and to seek immediate medical attention should they occur. Patient verbalized understanding of these instructions and education.  Patient Consent Given: Yes Education handout provided: Previously provided Muscles treated: bil cervical and thoracic multifidi, bil upper traps and rhomboids  Treatment response/outcome: Utilized skilled palpation to identify trigger points.  During dry needling able to palpate muscle twitch and muscle elongation  Elongation and release to bil neck and thoracic spine  Skilled palpation and monitoring by PT during dry needling                                                                                                            DATE: 08/13/22 See HEP below   PATIENT EDUCATION:  Education details: Exam findings, mechanics with her midback strengthening to avoid upper trap overactivity and pec compensation, initial HEP Person educated: Patient Education method: Explanation, Demonstration, and Handouts Education comprehension: verbalized understanding, returned demonstration, and needs further education  HOME EXERCISE PROGRAM: Access Code: [email or web address] URL: [email or web address] Date: 08/13/2022 Prepared by: Vernon Prey April Kirstie Peri  Exercises - Shoulder External Rotation and Scapular Retraction with Resistance  - 1 x daily - 7 x weekly - 2 sets - 10 reps - Shoulder W - External Rotation with Resistance  - 1 x daily - 7 x weekly - 2 sets - 10 reps - Standing Shoulder Horizontal Abduction with Resistance  - 1 x daily - 7 x weekly - 3 sets - 10 reps - Low Trap Setting at Wall  - 1 x daily - 7 x weekly - 2 sets - 10 reps - Standing Shoulder Row with Anchored Resistance  - 1 x daily - 7 x weekly - 2 sets - 10 reps - Shoulder extension with resistance - Neutral  - 1 x daily - 7 x weekly - 2 sets - 10 reps  ASSESSMENT:  CLINICAL IMPRESSION: Pt had good response to DN with improved symptoms for 4-5 days after.   Pt continues to be compliant with HEP. Pt is making postural corrections with home and work tasks.  Pt with good response to DN with improved tissue mobility and reduce stiffness after manual therapy.  Pt would benefit from PT to address these deficits for improved tolerance to all work and home tasks.    OBJECTIVE IMPAIRMENTS: decreased activity tolerance, decreased mobility, difficulty walking, decreased ROM, decreased strength, increased fascial restrictions, increased muscle spasms, impaired UE functional use, improper body mechanics, postural dysfunction, and pain.   ACTIVITY LIMITATIONS: carrying, lifting, standing, dressing, reach over head, hygiene/grooming, and caring for others  PARTICIPATION LIMITATIONS: meal prep, cleaning, laundry, community activity, and occupation  PERSONAL FACTORS: Age, Fitness, Past/current experiences, Profession, and Time since onset of injury/illness/exacerbation are also affecting patient's functional outcome.   REHAB POTENTIAL: Good  CLINICAL DECISION MAKING: Stable/uncomplicated  EVALUATION COMPLEXITY: Low   GOALS: Goals reviewed with patient? Yes  SHORT TERM GOALS: Target date: 09/11/2022   Pt will be ind with initial HEP Baseline:  Goal status: INITIAL  2.   Pt will report full pain and tension free cervical ROM Baseline:  Goal status: INITIAL   LONG TERM GOALS: Target date: 10/09/2022   Pt will be ind with management and progression of HEP Baseline:  Goal status: INITIAL  2.  Pt will report decrease in pain by >/=50% with all activities Baseline:  Goal status: INITIAL  3.  Pt will have improved FOTO score to 80 Baseline:  Goal status: INITIAL  4.  Pt will demo at least 4/5 midback strength for improved postural stability Baseline:  Goal status: INITIAL  5.  Pt will be able to reach and lift at least 25# to pick up and carry her child without pain exacerbation Baseline:  Goal status: INITIAL    PLAN:  PT FREQUENCY:  1x/wk initially and then transition to every other week due to financial constraints  PT DURATION: 8 weeks  PLANNED INTERVENTIONS: Therapeutic exercises, Therapeutic activity, Neuromuscular re-education, Balance training, Gait training, Patient/Family education, Self Care, Joint mobilization, Dry Needling, Electrical stimulation, Spinal mobilization, Cryotherapy, Moist heat, Taping, Vasopneumatic device, Ultrasound, Ionotophoresis 4mg /ml Dexamethasone, Manual therapy, and Re-evaluation  PLAN FOR NEXT SESSION: Assess response to DN,  Continue to work on postural stability/posterior shoulder strengthening. Watch for compensatory movements and address accordingly   Lorrene Reid, PT 08/27/22 11:40 AM   [facility] [address] [place], [place] [address] Phone # [phone] Fax [phone]

## 2022-09-09 NOTE — Therapy (Signed)
OUTPATIENT PHYSICAL THERAPY CERVICAL TREATMENT   [name] MRN: [id number] DOB:1985-07-11, 37 y.o., female Today's Date: 09/10/2022  END OF SESSION:  PT End of Session - 09/10/22 1015     Visit Number 4    Number of Visits 6    Date for PT Re-Evaluation 10/09/22    Authorization Type [employer]    PT Start Time 1015    PT Stop Time 1101    PT Time Calculation (min) 46 min    Activity Tolerance Patient tolerated treatment well    Behavior During Therapy WFL for tasks assessed/performed                Past Medical History:  Diagnosis Date   ADD (attention deficit disorder)    Allergy    no meds   Anemia    as adolescent   Anxiety    Depression    GERD (gastroesophageal reflux disease)    doesn't take any meds for this, diet controlled   Headache(784.0)    Heart murmur    as a child - no problems   History of bronchitis    as a teenager, no problems as adult   History of migraine    can't remember when the last one was   Insomnia    no meds   Panic attacks    Symptomatic cholelithiasis 09/25/2011   UTI (urinary tract infection)    Past Surgical History:  Procedure Laterality Date   CHOLECYSTECTOMY  10/22/2011   Procedure: LAPAROSCOPIC CHOLECYSTECTOMY;  Surgeon: Shelly Rubenstein, MD;  Location: [facility];  Service: General;  Laterality: N/A;   DILATION AND EVACUATION N/A 12/07/2019   Procedure: DILATATION AND EVACUATION;  Surgeon: Catalina Antigua, MD;  Location: [facility];  Service: Gynecology;  Laterality: N/A;  NEEDS ULTRASOUND GUIDANCE ANORA KIT SENT    HYSTEROSCOPY WITH RESECTOSCOPE N/A 04/28/2013   Procedure:  HYSTEROSCOPY ;  Surgeon: Genia Del, MD;  Location: [facility];  Service: Gynecology;  Laterality: N/A;   LAPAROSCOPY N/A 04/28/2013   Procedure: LAPAROSCOPY DIAGNOSTIC;  Surgeon: Genia Del, MD;  Location: [facility];  Service: Gynecology;  Laterality: N/A;  2 hrs. total   OPERATIVE ULTRASOUND N/A 12/07/2019    Procedure: OPERATIVE ULTRASOUND;  Surgeon: Catalina Antigua, MD;  Location: [facility];  Service: Gynecology;  Laterality: N/A;   WISDOM TOOTH EXTRACTION  52yrs ago   Patient Active Problem List   Diagnosis Date Noted   SVD (spontaneous vaginal delivery) 04/19/2021   Hypokalemia 04/06/2021   Ehlers-Danlos syndrome 02/06/2021   Missed abortion    Female infertility 08/17/2019    PCP: None indicated on file  REFERRING PROVIDER: Kathryne Hitch, MD  REFERRING DIAG: M54.2 (ICD-10-CM) - Cervicalgia  THERAPY DIAG:  Abnormal posture  Cervicalgia  Bilateral shoulder pain, unspecified chronicity  Cramp and spasm  Rationale for Evaluation and Treatment: Rehabilitation  ONSET DATE: May/June 2024  SUBJECTIVE:  SUBJECTIVE STATEMENT: Wasn't able to do her exercises much this past week secondary to my son having some issues.      Hand dominance: Right  PERTINENT HISTORY:  Carpal tunnel L hand  PAIN:  Are you having pain? Yes: NPRS scale: 5-6/10 Pain location: lower cervical, top of shoulders with cervical movement Pain description: Irritating, dull Aggravating factors: certain movements reaching and lifting Relieving factors: stretching  PRECAUTIONS: None  RED FLAGS: None   WEIGHT BEARING RESTRICTIONS: No  FALLS:  Has patient fallen in last 6 months? No  LIVING ENVIRONMENT: Lives with: lives with their family Lives in: House/apartment  OCCUPATION: Hair stylist  PLOF: Independent  PATIENT GOALS: Wants exercises to help address posture/neck/shoulders  NEXT MD VISIT: n/a  OBJECTIVE:   DIAGNOSTIC FINDINGS:  N/a  PATIENT SURVEYS:  FOTO 75; predicted 80 visit #9  COGNITION: Overall cognitive status: Within functional limits for tasks  assessed  SENSATION: WFL  POSTURE: rounded shoulders, forward head, and decreased thoracic kyphosis  PALPATION: Increased muscle spasm bilat UTs, tender with C7 PAs   CERVICAL ROM:   Active ROM A/PROM (deg) eval  Flexion 45  Extension 60  Right lateral flexion 35  Left lateral flexion 35*  Right rotation 50  Left rotation 50   (Blank rows = not tested) * = pain  UPPER EXTREMITY ROM:  Active ROM Right eval Left eval  Shoulder flexion    Shoulder extension    Shoulder abduction    Shoulder adduction    Shoulder extension    Shoulder internal rotation    Shoulder external rotation    Elbow flexion    Elbow extension    Wrist flexion    Wrist extension    Wrist ulnar deviation    Wrist radial deviation    Wrist pronation    Wrist supination     (Blank rows = not tested)  UPPER EXTREMITY MMT:  MMT Right eval Left eval  Shoulder flexion 4 4  Shoulder abduction 4+ 4+  Shoulder adduction    Shoulder extension    Shoulder internal rotation 5 5  Shoulder external rotation 3+ 3+  Middle trapezius 3+ 3+  Lower trapezius 3- 2+  Elbow flexion    Elbow extension    Wrist flexion    Wrist extension    Wrist ulnar deviation    Wrist radial deviation    Wrist pronation    Wrist supination    Grip strength     (Blank rows = not tested)  CERVICAL SPECIAL TESTS:  Neck flexor muscle endurance test: Positive  FUNCTIONAL TESTS:  Did not assess  TODAY'S TREATMENT:       DATE:   09/10/22 UBE L 2.5 x 3 min fwd, 2 min bwd Prone I, Y, T, W 2 x 10  ea Prone on elbows chin tucks 5 sec hold x 5 Primal push up x 5 max hold Thoracic cat/cow x 5 Open book, thoracic ext with foam roller Manual: STM to Left thoracic paraspinals and low traps, PA mobs thoracic spine Trigger Point Dry-Needling  Treatment instructions: Expect mild to moderate muscle soreness. S/S of pneumothorax if dry needled over a lung field, and to seek immediate medical attention should they occur.  Patient verbalized understanding of these instructions and education.  Patient Consent Given: Yes Education handout provided: Previously provided Muscles treated: L cervical and thoracic multifidi, upper traps and rhomboids, low traps Treatment response/outcome: Utilized skilled palpation to identify trigger points.  During dry needling able to palpate  muscle twitch and muscle elongation  Skilled palpation and monitoring by PT during dry needling      08/27/22 Review of HEP Standing rows and pull downs with extension- red band 2x10 ER "W", horizontal abduction 2x10     Trigger Point Dry-Needling  Treatment instructions: Expect mild to moderate muscle soreness. S/S of pneumothorax if dry needled over a lung field, and to seek immediate medical attention should they occur. Patient verbalized understanding of these instructions and education.  Patient Consent Given: Yes Education handout provided: Previously provided Muscles treated: bil cervical and thoracic multifidi, bil upper traps and rhomboids  Treatment response/outcome: Utilized skilled palpation to identify trigger points.  During dry needling able to palpate muscle twitch and muscle elongation  Elongation and release to bil neck and thoracic spine  Skilled palpation and monitoring by PT during dry needling                                                                                      DATE: 08/20/22 Review of HEP Standing rows and pull downs with extension- red band 2x10 ER "W", horizontal abduction 2x10     Trigger Point Dry-Needling  Treatment instructions: Expect mild to moderate muscle soreness. S/S of pneumothorax if dry needled over a lung field, and to seek immediate medical attention should they occur. Patient verbalized understanding of these instructions and education.  Patient Consent Given: Yes Education handout provided: Previously provided Muscles treated: bil cervical and thoracic multifidi, bil upper traps and  rhomboids  Treatment response/outcome: Utilized skilled palpation to identify trigger points.  During dry needling able to palpate muscle twitch and muscle elongation  Elongation and release to bil neck and thoracic spine  Skilled palpation and monitoring by PT during dry needling                                                                                                            DATE: 08/13/22 See HEP below   PATIENT EDUCATION:  Education details: Exam findings, mechanics with her midback strengthening to avoid upper trap overactivity and pec compensation, initial HEP Person educated: Patient Education method: Explanation, Demonstration, and Handouts Education comprehension: verbalized understanding, returned demonstration, and needs further education  HOME EXERCISE PROGRAM: Access Code: [email or web address] URL: [email or web address] Date: 09/10/2022 Prepared by: Raynelle Fanning  Exercises - Shoulder External Rotation and Scapular Retraction with Resistance  - 1 x daily - 7 x weekly - 2 sets - 10 reps - Shoulder W - External Rotation with Resistance  - 1 x daily - 7 x weekly - 2 sets - 10 reps - Standing Shoulder Horizontal Abduction with Resistance  - 1 x daily - 7 x weekly - 3 sets - 10 reps -  Low Trap Setting at Wall  - 1 x daily - 7 x weekly - 2 sets - 10 reps - Standing Shoulder Row with Anchored Resistance  - 1 x daily - 7 x weekly - 2 sets - 10 reps - Shoulder extension with resistance - Neutral  - 1 x daily - 7 x weekly - 2 sets - 10 reps - Prone Shoulder Flexion  - 1 x daily - 3-4 x weekly - 3 sets - 10 reps - Prone Scapular Retraction Y  - 1 x daily - 3-4 x weekly - 1 sets - 10 reps - Prone Shoulder Horizontal Abduction  - 1 x daily - 3-4 x weekly - 1 sets - 10 reps - Prone W Scapular Retraction  - 1 x daily - 3-4 x weekly - 3 sets - 10 reps  ASSESSMENT:  CLINICAL IMPRESSION: Krista Lee presents with complaints mainly in the left neck today. We progressed her HEP with prone  upper back strengthening. She continues to have good response to DN in L cervical multifidi. The remaining muscles continue to be tight, but no significant trigger points identified today. Still tender in mainly the L low traps. Krista Lee continues to demonstrate potential for improvement and would benefit from continued skilled therapy to address impairments.    OBJECTIVE IMPAIRMENTS: decreased activity tolerance, decreased mobility, difficulty walking, decreased ROM, decreased strength, increased fascial restrictions, increased muscle spasms, impaired UE functional use, improper body mechanics, postural dysfunction, and pain.   ACTIVITY LIMITATIONS: carrying, lifting, standing, dressing, reach over head, hygiene/grooming, and caring for others  PARTICIPATION LIMITATIONS: meal prep, cleaning, laundry, community activity, and occupation  PERSONAL FACTORS: Age, Fitness, Past/current experiences, Profession, and Time since onset of injury/illness/exacerbation are also affecting patient's functional outcome.   REHAB POTENTIAL: Good  CLINICAL DECISION MAKING: Stable/uncomplicated  EVALUATION COMPLEXITY: Low   GOALS: Goals reviewed with patient? Yes  SHORT TERM GOALS: Target date: 09/11/2022   Pt will be ind with initial HEP Baseline:  Goal status: MET  2.  Pt will report full pain and tension free cervical ROM Baseline:  Goal status: INITIAL   LONG TERM GOALS: Target date: 10/09/2022   Pt will be ind with management and progression of HEP Baseline:  Goal status: INITIAL  2.  Pt will report decrease in pain by >/=50% with all activities Baseline:  Goal status: INITIAL  3.  Pt will have improved FOTO score to 80 Baseline:  Goal status: INITIAL  4.  Pt will demo at least 4/5 midback strength for improved postural stability Baseline:  Goal status: INITIAL  5.  Pt will be able to reach and lift at least 25# to pick up and carry her child without pain exacerbation Baseline:  Goal  status: INITIAL    PLAN:  PT FREQUENCY:  1x/wk initially and then transition to every other week due to financial constraints  PT DURATION: 8 weeks  PLANNED INTERVENTIONS: Therapeutic exercises, Therapeutic activity, Neuromuscular re-education, Balance training, Gait training, Patient/Family education, Self Care, Joint mobilization, Dry Needling, Electrical stimulation, Spinal mobilization, Cryotherapy, Moist heat, Taping, Vasopneumatic device, Ultrasound, Ionotophoresis 4mg /ml Dexamethasone, Manual therapy, and Re-evaluation  PLAN FOR NEXT SESSION: Assess response to DN,  Continue to work on postural stability/posterior shoulder strengthening. Watch for compensatory movements and address accordingly. Manual work if indicated.   Solon Palm, PT  09/10/22 11:10 AM   [facility] [address], [address] [place], [place] [address] Phone # [phone] Fax [phone]

## 2022-09-10 ENCOUNTER — Ambulatory Visit: Payer: Commercial Managed Care - HMO | Attending: Orthopaedic Surgery | Admitting: Physical Therapy

## 2022-09-10 DIAGNOSIS — M542 Cervicalgia: Secondary | ICD-10-CM

## 2022-09-10 DIAGNOSIS — R293 Abnormal posture: Secondary | ICD-10-CM | POA: Diagnosis present

## 2022-09-10 DIAGNOSIS — M25512 Pain in left shoulder: Secondary | ICD-10-CM | POA: Diagnosis present

## 2022-09-10 DIAGNOSIS — M6281 Muscle weakness (generalized): Secondary | ICD-10-CM | POA: Diagnosis present

## 2022-09-10 DIAGNOSIS — R252 Cramp and spasm: Secondary | ICD-10-CM | POA: Diagnosis present

## 2022-09-10 DIAGNOSIS — M25511 Pain in right shoulder: Secondary | ICD-10-CM | POA: Diagnosis present

## 2022-09-17 ENCOUNTER — Ambulatory Visit (INDEPENDENT_AMBULATORY_CARE_PROVIDER_SITE_OTHER): Payer: Commercial Managed Care - HMO | Admitting: Clinical

## 2022-09-17 ENCOUNTER — Encounter: Payer: Commercial Managed Care - HMO | Admitting: Physical Therapy

## 2022-09-17 DIAGNOSIS — F411 Generalized anxiety disorder: Secondary | ICD-10-CM

## 2022-09-17 NOTE — Progress Notes (Signed)
Diagnosis: F41.1 Time: 10:03am-10:59am CPT Code: [id number]-04  Krista Lee was seen remotely using secure video conferencing. She was in her home in [place] and the therapist was in her office at the time of the appointment. Client is aware of risks of telehealth and consented to a virtual visit. She reported improvements in overall mood and a reduction in anxiety since her last session, which she attributed to a good few months with her husband after he started an effective medication. Session focused on processing recent events. Krista Lee indicated a plan to drop down to sessions every two months. She is scheduled to be seen again in November.    Treatment Plan Client Abilities/Strengths  Krista Lee presented as insightful and motivated, with clear goals for therapy.  Client Treatment Preferences  Krista Lee would like to begin with monthly appointments, and increase frequency once she has hit her insurance deductible.  Client Statement of Needs  Krista Lee is seeking CBT to address anxiety and relationship challenges.  Treatment Level  Monthly  Symptoms  Anxiety : vivid, persistent thoughts of worst case scenarios that are difficult to disengage from (Status: maintained). Relational Challenges: Difficulty with mutual triggering of uncomfortable emotions in marriage (Status: maintained).  Problems Addressed  New Description, New Description  Goals 1. Difficulty in relationship with husband Objective Development of strategies to regulate emotional response to husband's stress Target Date: 2023-01-09 Frequency: Biweekly  Progress: 0 Modality: individual  Related Interventions Therapist will work with Krista Lee to develop communication strategies by helping her to consider how she might respond to different situations and offering opportunities to practice, such as through written exercises and role plays Therapist will work with Krista Lee to develop her communication strategies, including labeling and expressing her  emotions and using "I" statements 2. Persistent anxiety triggered by thoughts of worse case scenarios that feel real Objective Decrease in frequency and severity of anxiety, increase in overall well being Target Date: 2023-01-09 Frequency: Biweekly  Progress: 0 Modality: individual  Related Interventions Therapist will provide referrals for additional resources as appropriate Therapist will provide Krista Lee with strategies to regulate her emotions, including meditation, breathing exercises, mindfulness, and self-care. Therapist will help Krista Lee to identify and disengage from maladaptive thought patterns using CBT-based strategies Therapist will provide Krista Lee with opportunities to process her experiences in session Diagnosis Axis none 300.00 (Anxiety state, unspecified) - Open - [Signifier: n/a]    Conditions For Discharge Achievement of treatment goals and objectives   [name], PhD               [name], PhD

## 2022-09-23 NOTE — Therapy (Signed)
OUTPATIENT PHYSICAL THERAPY CERVICAL TREATMENT   [name] MRN: [id number] DOB:17-Feb-1985, 37 y.o., female Today's Date: 09/24/2022  END OF SESSION:  PT End of Session - 09/24/22 1622     Visit Number 5    Number of Visits 6    Date for PT Re-Evaluation 10/09/22    Authorization Type [employer]    PT Start Time 1623    PT Stop Time 1706    PT Time Calculation (min) 43 min    Activity Tolerance Patient tolerated treatment well    Behavior During Therapy WFL for tasks assessed/performed                 Past Medical History:  Diagnosis Date   ADD (attention deficit disorder)    Allergy    no meds   Anemia    as adolescent   Anxiety    Depression    GERD (gastroesophageal reflux disease)    doesn't take any meds for this, diet controlled   Headache(784.0)    Heart murmur    as a child - no problems   History of bronchitis    as a teenager, no problems as adult   History of migraine    can't remember when the last one was   Insomnia    no meds   Panic attacks    Symptomatic cholelithiasis 09/25/2011   UTI (urinary tract infection)    Past Surgical History:  Procedure Laterality Date   CHOLECYSTECTOMY  10/22/2011   Procedure: LAPAROSCOPIC CHOLECYSTECTOMY;  Surgeon: Shelly Rubenstein, MD;  Location: [facility];  Service: General;  Laterality: N/A;   DILATION AND EVACUATION N/A 12/07/2019   Procedure: DILATATION AND EVACUATION;  Surgeon: Catalina Antigua, MD;  Location: [facility];  Service: Gynecology;  Laterality: N/A;  NEEDS ULTRASOUND GUIDANCE ANORA KIT SENT    HYSTEROSCOPY WITH RESECTOSCOPE N/A 04/28/2013   Procedure:  HYSTEROSCOPY ;  Surgeon: Genia Del, MD;  Location: [facility];  Service: Gynecology;  Laterality: N/A;   LAPAROSCOPY N/A 04/28/2013   Procedure: LAPAROSCOPY DIAGNOSTIC;  Surgeon: Genia Del, MD;  Location: [facility];  Service: Gynecology;  Laterality: N/A;  2 hrs. total   OPERATIVE ULTRASOUND N/A 12/07/2019    Procedure: OPERATIVE ULTRASOUND;  Surgeon: Catalina Antigua, MD;  Location: [facility];  Service: Gynecology;  Laterality: N/A;   WISDOM TOOTH EXTRACTION  51yrs ago   Patient Active Problem List   Diagnosis Date Noted   SVD (spontaneous vaginal delivery) 04/19/2021   Hypokalemia 04/06/2021   Ehlers-Danlos syndrome 02/06/2021   Missed abortion    Female infertility 08/17/2019    PCP: None indicated on file  REFERRING PROVIDER: Kathryne Hitch, MD  REFERRING DIAG: M54.2 (ICD-10-CM) - Cervicalgia  THERAPY DIAG:  Abnormal posture  Cervicalgia  Bilateral shoulder pain, unspecified chronicity  Cramp and spasm  Muscle weakness (generalized)  Rationale for Evaluation and Treatment: Rehabilitation  ONSET DATE: May/June 2024  SUBJECTIVE:  SUBJECTIVE STATEMENT: Haven't been as diligent with my exercises due to my toddler. My left side tends to flare up around my collarbone. It happened a couple of days ago but today is better.      Hand dominance: Right  PERTINENT HISTORY:  Carpal tunnel L hand  PAIN:  Are you having pain? Yes: NPRS scale: 0/10 Pain location: lower cervical, top of shoulders with cervical movement Pain description: Irritating, dull Aggravating factors: certain movements reaching and lifting Relieving factors: stretching  PRECAUTIONS: None  RED FLAGS: None   WEIGHT BEARING RESTRICTIONS: No  FALLS:  Has patient fallen in last 6 months? No  LIVING ENVIRONMENT: Lives with: lives with their family Lives in: House/apartment  OCCUPATION: Hair stylist  PLOF: Independent  PATIENT GOALS: Wants exercises to help address posture/neck/shoulders  NEXT MD VISIT: n/a  OBJECTIVE:   DIAGNOSTIC FINDINGS:  N/a  PATIENT SURVEYS:  FOTO 75;  predicted 80 visit #9  COGNITION: Overall cognitive status: Within functional limits for tasks assessed  SENSATION: WFL  POSTURE: rounded shoulders, forward head, and decreased thoracic kyphosis  PALPATION: Increased muscle spasm bilat UTs, tender with C7 PAs   CERVICAL ROM:   Active ROM A/PROM (deg) eval  Flexion 45  Extension 60  Right lateral flexion 35  Left lateral flexion 35*  Right rotation 50  Left rotation 50   (Blank rows = not tested) * = pain  UPPER EXTREMITY ROM:  Active ROM Right eval Left eval  Shoulder flexion    Shoulder extension    Shoulder abduction    Shoulder adduction    Shoulder extension    Shoulder internal rotation    Shoulder external rotation    Elbow flexion    Elbow extension    Wrist flexion    Wrist extension    Wrist ulnar deviation    Wrist radial deviation    Wrist pronation    Wrist supination     (Blank rows = not tested)  UPPER EXTREMITY MMT:  MMT Right eval Left eval  Shoulder flexion 4 4  Shoulder abduction 4+ 4+  Shoulder adduction    Shoulder extension    Shoulder internal rotation 5 5  Shoulder external rotation 3+ 3+  Middle trapezius 3+ 3+  Lower trapezius 3- 2+  Elbow flexion    Elbow extension    Wrist flexion    Wrist extension    Wrist ulnar deviation    Wrist radial deviation    Wrist pronation    Wrist supination    Grip strength     (Blank rows = not tested)  CERVICAL SPECIAL TESTS:  Neck flexor muscle endurance test: Positive  FUNCTIONAL TESTS:  Did not assess  TODAY'S TREATMENT:       DATE:  09/24/22 UBE L 2.5 x 3 min fwd, 2 min bwd W and ER with retraction red x 10 ea ER in door red x 10 Prone I, Y, T, W 2 x 10  ea Prone lat pull on sliders - OH flexion pushing thru sliders and pulling to armpits Prone on elbows chin tucks 5 sec hold x 5 Thoracic cat/cow x 5 Seated scalene stretch with strap 2x30 sec ea Manual: STM to Left levator scap, PA mobs thoracic spine Trigger Point  Dry-Needling  Treatment instructions: Expect mild to moderate muscle soreness. S/S of pneumothorax if dry needled over a lung field, and to seek immediate medical attention should they occur. Patient verbalized understanding of these instructions and education.  Patient Consent Given:  Yes Education handout provided: Previously provided Muscles treated: B cervical multifidi, upper traps and L levator scap Treatment response/outcome: Utilized skilled palpation to identify trigger points.  During dry needling able to palpate muscle twitch and muscle elongation; twitch response R UT  Skilled palpation and monitoring by PT during dry needling    09/10/22 UBE L 2.5 x 3 min fwd, 2 min bwd Prone I, Y, T, W 2 x 10  ea Prone on elbows chin tucks 5 sec hold x 5 Primal push up x 5 max hold Thoracic cat/cow x 5 Open book, thoracic ext with foam roller Manual: STM to Left thoracic paraspinals and low traps, PA mobs thoracic spine Trigger Point Dry-Needling  Treatment instructions: Expect mild to moderate muscle soreness. S/S of pneumothorax if dry needled over a lung field, and to seek immediate medical attention should they occur. Patient verbalized understanding of these instructions and education.   Patient Consent Given: Yes Education handout provided: Previously provided Muscles treated: L cervical and thoracic multifidi, upper traps and rhomboids, low traps Treatment response/outcome: Utilized skilled palpation to identify trigger points.  During dry needling able to palpate muscle twitch and muscle elongation  Skilled palpation and monitoring by PT during dry needling     08/27/22 Review of HEP Standing rows and pull downs with extension- red band 2x10 ER "W", horizontal abduction 2x10     Trigger Point Dry-Needling  Treatment instructions: Expect mild to moderate muscle soreness. S/S of pneumothorax if dry needled over a lung field, and to seek immediate medical attention should they occur.  Patient verbalized understanding of these instructions and education.  Patient Consent Given: Yes Education handout provided: Previously provided Muscles treated: bil cervical and thoracic multifidi, bil upper traps and rhomboids  Treatment response/outcome: Utilized skilled palpation to identify trigger points.  During dry needling able to palpate muscle twitch and muscle elongation  Elongation and release to bil neck and thoracic spine  Skilled palpation and monitoring by PT during dry needling                                                                                      DATE: 08/20/22 Review of HEP Standing rows and pull downs with extension- red band 2x10 ER "W", horizontal abduction 2x10     Trigger Point Dry-Needling  Treatment instructions: Expect mild to moderate muscle soreness. S/S of pneumothorax if dry needled over a lung field, and to seek immediate medical attention should they occur. Patient verbalized understanding of these instructions and education.  Patient Consent Given: Yes Education handout provided: Previously provided Muscles treated: bil cervical and thoracic multifidi, bil upper traps and rhomboids  Treatment response/outcome: Utilized skilled palpation to identify trigger points.  During dry needling able to palpate muscle twitch and muscle elongation  Elongation and release to bil neck and thoracic spine  Skilled palpation and monitoring by PT during dry needling  DATE: 08/13/22 See HEP below   PATIENT EDUCATION:  Education details: Exam findings, mechanics with her midback strengthening to avoid upper trap overactivity and pec compensation, initial HEP Person educated: Patient Education method: Explanation, Demonstration, and Handouts Education comprehension: verbalized understanding, returned demonstration, and needs further education  HOME  EXERCISE PROGRAM: Access Code: [email or web address] URL: [email or web address] Date: 09/24/2022 Prepared by: Raynelle Fanning  Exercises  - Seated Scalene Stretch with Towel  - 1 x daily - 7 x weekly - 1 sets - 3 reps - 30 sec hold  ASSESSMENT:  CLINICAL IMPRESSION: Krista Lee reports overall improvement with pain and strength. She is partially compliant with HEP, but feels it is helping for the amount she does it. She recognizes that increased frequency would likely show improved results. She has less tension overall in B neck but still some lingering trigger points especially in L levator scap and R UT today. Good response to both TE and manual today. Tyranny should be ready for discharge after her two remaining visits.     OBJECTIVE IMPAIRMENTS: decreased activity tolerance, decreased mobility, difficulty walking, decreased ROM, decreased strength, increased fascial restrictions, increased muscle spasms, impaired UE functional use, improper body mechanics, postural dysfunction, and pain.   ACTIVITY LIMITATIONS: carrying, lifting, standing, dressing, reach over head, hygiene/grooming, and caring for others  PARTICIPATION LIMITATIONS: meal prep, cleaning, laundry, community activity, and occupation  PERSONAL FACTORS: Age, Fitness, Past/current experiences, Profession, and Time since onset of injury/illness/exacerbation are also affecting patient's functional outcome.   REHAB POTENTIAL: Good  CLINICAL DECISION MAKING: Stable/uncomplicated  EVALUATION COMPLEXITY: Low   GOALS: Goals reviewed with patient? Yes  SHORT TERM GOALS: Target date: 09/11/2022   Pt will be ind with initial HEP Baseline:  Goal status: MET  2.  Pt will report full pain and tension free cervical ROM Baseline:  Goal status: PARTIALLY MET   LONG TERM GOALS: Target date: 10/09/2022   Pt will be ind with management and progression of HEP Baseline:  Goal status: INITIAL  2.  Pt will report decrease in pain by >/=50%  with all activities Baseline:  Goal status: INITIAL  3.  Pt will have improved FOTO score to 80 Baseline:  Goal status: INITIAL  4.  Pt will demo at least 4/5 midback strength for improved postural stability Baseline:  Goal status: INITIAL  5.  Pt will be able to reach and lift at least 25# to pick up and carry her child without pain exacerbation Baseline:  Goal status: INITIAL    PLAN:  PT FREQUENCY:  1x/wk initially and then transition to every other week due to financial constraints  PT DURATION: 8 weeks  PLANNED INTERVENTIONS: Therapeutic exercises, Therapeutic activity, Neuromuscular re-education, Balance training, Gait training, Patient/Family education, Self Care, Joint mobilization, Dry Needling, Electrical stimulation, Spinal mobilization, Cryotherapy, Moist heat, Taping, Vasopneumatic device, Ultrasound, Ionotophoresis 4mg /ml Dexamethasone, Manual therapy, and Re-evaluation  PLAN FOR NEXT SESSION: Check LTGs, Assess response to DN,  Continue to work on postural stability/posterior shoulder strengthening. Watch for compensatory movements and address accordingly. Manual work if indicated.   Solon Palm, PT  09/24/22 5:14 PM   [facility] [address] [place], [place] [address] Phone # [phone] Fax [phone]

## 2022-09-24 ENCOUNTER — Encounter: Payer: Self-pay | Admitting: Physical Therapy

## 2022-09-24 ENCOUNTER — Ambulatory Visit: Payer: Commercial Managed Care - HMO | Admitting: Physical Therapy

## 2022-09-24 DIAGNOSIS — R252 Cramp and spasm: Secondary | ICD-10-CM

## 2022-09-24 DIAGNOSIS — M25511 Pain in right shoulder: Secondary | ICD-10-CM

## 2022-09-24 DIAGNOSIS — R293 Abnormal posture: Secondary | ICD-10-CM

## 2022-09-24 DIAGNOSIS — M6281 Muscle weakness (generalized): Secondary | ICD-10-CM

## 2022-09-24 DIAGNOSIS — M542 Cervicalgia: Secondary | ICD-10-CM

## 2022-10-01 ENCOUNTER — Encounter: Payer: Commercial Managed Care - HMO | Admitting: Physical Therapy

## 2022-10-08 ENCOUNTER — Ambulatory Visit: Payer: Commercial Managed Care - HMO | Attending: Orthopaedic Surgery

## 2022-10-08 DIAGNOSIS — R293 Abnormal posture: Secondary | ICD-10-CM | POA: Diagnosis present

## 2022-10-08 DIAGNOSIS — R252 Cramp and spasm: Secondary | ICD-10-CM | POA: Diagnosis present

## 2022-10-08 DIAGNOSIS — M25511 Pain in right shoulder: Secondary | ICD-10-CM | POA: Diagnosis present

## 2022-10-08 DIAGNOSIS — M25512 Pain in left shoulder: Secondary | ICD-10-CM | POA: Diagnosis present

## 2022-10-08 DIAGNOSIS — M542 Cervicalgia: Secondary | ICD-10-CM | POA: Insufficient documentation

## 2022-10-08 DIAGNOSIS — M6281 Muscle weakness (generalized): Secondary | ICD-10-CM | POA: Diagnosis present

## 2022-10-08 NOTE — Therapy (Signed)
OUTPATIENT PHYSICAL THERAPY CERVICAL TREATMENT   [name] MRN: [id number] DOB:[birth year], 37 y.o., female Today's Date: 10/08/2022  END OF SESSION:  PT End of Session - 10/08/22 1030     Visit Number 6    Date for PT Re-Evaluation 12/03/22    Authorization Type [employer]    PT Start Time 1018    PT Stop Time 1104    PT Time Calculation (min) 46 min    Activity Tolerance Patient tolerated treatment well    Behavior During Therapy WFL for tasks assessed/performed                  Past Medical History:  Diagnosis Date   ADD (attention deficit disorder)    Allergy    no meds   Anemia    as adolescent   Anxiety    Depression    GERD (gastroesophageal reflux disease)    doesn't take any meds for this, diet controlled   Headache(784.0)    Heart murmur    as a child - no problems   History of bronchitis    as a teenager, no problems as adult   History of migraine    can't remember when the last one was   Insomnia    no meds   Panic attacks    Symptomatic cholelithiasis 09/25/2011   UTI (urinary tract infection)    Past Surgical History:  Procedure Laterality Date   CHOLECYSTECTOMY  10/22/2011   Procedure: LAPAROSCOPIC CHOLECYSTECTOMY;  Surgeon: Shelly Rubenstein, MD;  Location: [facility];  Service: General;  Laterality: N/A;   DILATION AND EVACUATION N/A 12/07/2019   Procedure: DILATATION AND EVACUATION;  Surgeon: Catalina Antigua, MD;  Location: [facility];  Service: Gynecology;  Laterality: N/A;  NEEDS ULTRASOUND GUIDANCE ANORA KIT SENT    HYSTEROSCOPY WITH RESECTOSCOPE N/A 04/28/2013   Procedure:  HYSTEROSCOPY ;  Surgeon: Genia Del, MD;  Location: [facility];  Service: Gynecology;  Laterality: N/A;   LAPAROSCOPY N/A 04/28/2013   Procedure: LAPAROSCOPY DIAGNOSTIC;  Surgeon: Genia Del, MD;  Location: [facility];  Service: Gynecology;  Laterality: N/A;  2 hrs. total   OPERATIVE ULTRASOUND N/A 12/07/2019   Procedure: OPERATIVE  ULTRASOUND;  Surgeon: Catalina Antigua, MD;  Location: [facility];  Service: Gynecology;  Laterality: N/A;   WISDOM TOOTH EXTRACTION  76yrs ago   Patient Active Problem List   Diagnosis Date Noted   SVD (spontaneous vaginal delivery) 04/19/2021   Hypokalemia 04/06/2021   Ehlers-Danlos syndrome 02/06/2021   Missed abortion    Female infertility 08/17/2019    PCP: None indicated on file  REFERRING PROVIDER: Kathryne Hitch, MD  REFERRING DIAG: M54.2 (ICD-10-CM) - Cervicalgia  THERAPY DIAG:  Abnormal posture - Plan: PT plan of care cert/re-cert  Cervicalgia - Plan: PT plan of care cert/re-cert  Bilateral shoulder pain, unspecified chronicity - Plan: PT plan of care cert/re-cert  Cramp and spasm - Plan: PT plan of care cert/re-cert  Muscle weakness (generalized) - Plan: PT plan of care cert/re-cert  Rationale for Evaluation and Treatment: Rehabilitation  ONSET DATE: May/June 2024  SUBJECTIVE:  SUBJECTIVE STATEMENT: I feel about 50% better overall.  DN is helping my stiffness.  I need to be more consistent with my HEP.  I like the variety that I have.  I start to clench my jaw as I get closer to my next PT appt.    Hand dominance: Right  PERTINENT HISTORY:  Carpal tunnel L hand  PAIN:  Are you having pain? Yes: NPRS scale: 1-2/10 Pain location: lower cervical, top of shoulders with cervical movement Pain description: Irritating, dull Aggravating factors: certain movements reaching and lifting Relieving factors: stretching  PRECAUTIONS: None  RED FLAGS: None   WEIGHT BEARING RESTRICTIONS: No  FALLS:  Has patient fallen in last 6 months? No  LIVING ENVIRONMENT: Lives with: lives with their family Lives in: House/apartment  OCCUPATION: Hair  stylist  PLOF: Independent  PATIENT GOALS: Wants exercises to help address posture/neck/shoulders  NEXT MD VISIT: n/a  OBJECTIVE:   DIAGNOSTIC FINDINGS:  N/a  PATIENT SURVEYS:  FOTO 75; predicted 80 visit #9 10/08/22: 4- goal met and FOTO removed   COGNITION: Overall cognitive status: Within functional limits for tasks assessed  SENSATION: WFL  POSTURE: rounded shoulders, forward head, and decreased thoracic kyphosis  PALPATION: Increased muscle spasm bilat UTs, tender with C7 PAs   CERVICAL ROM:   Active ROM A/PROM (deg) eval  Flexion 45  Extension 60  Right lateral flexion 35  Left lateral flexion 35*  Right rotation 50  Left rotation 50   (Blank rows = not tested) * = pain    UPPER EXTREMITY MMT:  MMT Right eval Left eval  Shoulder flexion 4 4  Shoulder abduction 4+ 4+  Shoulder adduction    Shoulder extension    Shoulder internal rotation 5 5  Shoulder external rotation 3+ 3+  Middle trapezius 3+ 3+  Lower trapezius 3- 2+  Elbow flexion    Elbow extension    Wrist flexion    Wrist extension    Wrist ulnar deviation    Wrist radial deviation    Wrist pronation    Wrist supination    Grip strength     (Blank rows = not tested)  CERVICAL SPECIAL TESTS:  Neck flexor muscle endurance test: Positive  FUNCTIONAL TESTS:  Did not assess  TODAY'S TREATMENT:       DATE:  10/08/22 UBE L 2.5 x 3 min fwd, 3 min bwd- PT present to discuss progress  Scap retraction in quadruped 2x10 ER Lt only x 10 Prone I, Y, T, W 2 x 10  each Seated scalene stretch with strap 2x30 sec ea Manual: STM to Left levator scap and bil paraspinals  Trigger Point Dry-Needling  Treatment instructions: Expect mild to moderate muscle soreness. S/S of pneumothorax if dry needled over a lung field, and to seek immediate medical attention should they occur. Patient verbalized understanding of these instructions and education.  Patient Consent Given: Yes Education handout  provided: Previously provided Muscles treated: B cervical multifidi, upper traps and Lt levator scap Treatment response/outcome: Utilized skilled palpation to identify trigger points.  During dry needling able to palpate muscle twitch and muscle elongation; twitch response R UT  Skilled palpation and monitoring by PT during dry needling  DATE:  09/24/22 UBE L 2.5 x 3 min fwd, 2 min bwd W and ER with retraction red x 10 ea ER in door red x 10 Prone I, Y, T, W 2 x 10  ea Prone lat pull on sliders - OH flexion pushing thru sliders and  pulling to armpits Prone on elbows chin tucks 5 sec hold x 5 Thoracic cat/cow x 5 Seated scalene stretch with strap 2x30 sec ea Manual: STM to Left levator scap, PA mobs thoracic spine Trigger Point Dry-Needling  Treatment instructions: Expect mild to moderate muscle soreness. S/S of pneumothorax if dry needled over a lung field, and to seek immediate medical attention should they occur. Patient verbalized understanding of these instructions and education.  Patient Consent Given: Yes Education handout provided: Previously provided Muscles treated: B cervical multifidi, upper traps and L levator scap Treatment response/outcome: Utilized skilled palpation to identify trigger points.  During dry needling able to palpate muscle twitch and muscle elongation; twitch response R UT  Skilled palpation and monitoring by PT during dry needling    09/10/22 UBE L 2.5 x 3 min fwd, 2 min bwd Prone I, Y, T, W 2 x 10  ea Prone on elbows chin tucks 5 sec hold x 5 Primal push up x 5 max hold Thoracic cat/cow x 5 Open book, thoracic ext with foam roller Manual: STM to Left thoracic paraspinals and low traps, PA mobs thoracic spine Trigger Point Dry-Needling  Treatment instructions: Expect mild to moderate muscle soreness. S/S of pneumothorax if dry needled over a lung field, and to seek immediate medical attention should they occur. Patient verbalized understanding of these  instructions and education.   Patient Consent Given: Yes Education handout provided: Previously provided Muscles treated: L cervical and thoracic multifidi, upper traps and rhomboids, low traps Treatment response/outcome: Utilized skilled palpation to identify trigger points.  During dry needling able to palpate muscle twitch and muscle elongation  Skilled palpation and monitoring by PT during dry needling      PATIENT EDUCATION:  Education details: Exam findings, mechanics with her midback strengthening to avoid upper trap overactivity and pec compensation, initial HEP Person educated: Patient Education method: Explanation, Demonstration, and Handouts Education comprehension: verbalized understanding, returned demonstration, and needs further education  HOME EXERCISE PROGRAM: Access Code: [email or web address] URL: [email or web address] Date: 09/24/2022 Prepared by: Raynelle Fanning  Exercises  - Seated Scalene Stretch with Towel  - 1 x daily - 7 x weekly - 1 sets - 3 reps - 30 sec hold  ASSESSMENT:  CLINICAL IMPRESSION: Pt reports 50% overall reduction in symptoms since the start of care.  She is working to improve her postural strength and endurance needed for the repetitive nature of her  She has less tension overall in bil neck but still some lingering trigger points especially in Lt levator scap and Rt cervical multifidi today. Good response to both TE and dry needling today.  Good twitch responses and improved tissue mobility after treatment.  FOTO goal is met indicating improved overall function.  Pt reports min to no increased pain with lifting her son or heavy objects.  Pt will attend 2 visits over the next 6-8 weeks    OBJECTIVE IMPAIRMENTS: decreased activity tolerance, decreased mobility, difficulty walking, decreased ROM, decreased strength, increased fascial restrictions, increased muscle spasms, impaired UE functional use, improper body mechanics, postural dysfunction, and pain.    ACTIVITY LIMITATIONS: carrying, lifting, standing, dressing, reach over head, hygiene/grooming, and caring for others  PARTICIPATION LIMITATIONS: meal prep, cleaning, laundry, community activity, and occupation  PERSONAL FACTORS: Age, Fitness, Past/current experiences, Profession, and Time since onset of injury/illness/exacerbation are also affecting patient's functional outcome.   REHAB POTENTIAL: Good  CLINICAL DECISION MAKING: Stable/uncomplicated  EVALUATION COMPLEXITY: Low   GOALS: Goals reviewed with patient? Yes  SHORT TERM GOALS: Target  date: 09/11/2022   Pt will be ind with initial HEP Baseline:  Goal status: MET  2.  Pt will report full pain and tension free cervical ROM Baseline:  Goal status: PARTIALLY MET   LONG TERM GOALS: Target date: 12/03/22   Pt will be ind with management and progression of HEP Baseline:  Goal status: in progress   2.  Pt will report decrease in pain by >/=70% with all activities Baseline: 50% better (10/08/22) Goal status: REVISED  3.  Pt will have improved FOTO score to 80 Baseline: 82 (10/08/22) Goal status: MET  4.  Pt will demo at least 4/5 midback strength for improved postural stability Baseline:  Goal status: In progress   5.  Pt will be able to reach and lift at least 25# to pick up and carry her child without pain exacerbation Baseline:  some increased pain with repetition (10/08/22) Goal status: In progress     PLAN:  PT FREQUENCY:  1x/wk initially and then transition to every other week due to financial constraints  PT DURATION: 8 weeks  PLANNED INTERVENTIONS: Therapeutic exercises, Therapeutic activity, Neuromuscular re-education, Balance training, Gait training, Patient/Family education, Self Care, Joint mobilization, Dry Needling, Electrical stimulation, Spinal mobilization, Cryotherapy, Moist heat, Taping, Vasopneumatic device, Ultrasound, Ionotophoresis 4mg /ml Dexamethasone, Manual therapy, and  Re-evaluation  PLAN FOR NEXT SESSION: 1-2 more sessions spaced out over 6-8 weeks.  Postural strength, manual to address tissue mobility.    Lorrene Reid, PT 10/08/22 11:13 AM   [facility] [address], [address] [place], [place] [address] Phone # [phone] Fax [phone]

## 2022-10-15 ENCOUNTER — Ambulatory Visit: Payer: Commercial Managed Care - HMO | Admitting: Clinical

## 2022-10-22 ENCOUNTER — Ambulatory Visit: Payer: Commercial Managed Care - HMO

## 2022-10-22 DIAGNOSIS — M542 Cervicalgia: Secondary | ICD-10-CM

## 2022-10-22 DIAGNOSIS — R293 Abnormal posture: Secondary | ICD-10-CM | POA: Diagnosis not present

## 2022-10-22 DIAGNOSIS — R252 Cramp and spasm: Secondary | ICD-10-CM

## 2022-10-22 DIAGNOSIS — M25511 Pain in right shoulder: Secondary | ICD-10-CM

## 2022-10-22 NOTE — Therapy (Signed)
OUTPATIENT PHYSICAL THERAPY CERVICAL TREATMENT   [name] MRN: [id number] DOB:1985-02-18, 37 y.o., female Today's Date: 10/22/2022  END OF SESSION:  PT End of Session - 10/22/22 1613     Visit Number 7    Date for PT Re-Evaluation 12/03/22    Authorization Type [employer]    PT Start Time 1532    PT Stop Time 1614    PT Time Calculation (min) 42 min    Activity Tolerance Patient tolerated treatment well    Behavior During Therapy WFL for tasks assessed/performed                   Past Medical History:  Diagnosis Date   ADD (attention deficit disorder)    Allergy    no meds   Anemia    as adolescent   Anxiety    Depression    GERD (gastroesophageal reflux disease)    doesn't take any meds for this, diet controlled   Headache(784.0)    Heart murmur    as a child - no problems   History of bronchitis    as a teenager, no problems as adult   History of migraine    can't remember when the last one was   Insomnia    no meds   Panic attacks    Symptomatic cholelithiasis 09/25/2011   UTI (urinary tract infection)    Past Surgical History:  Procedure Laterality Date   CHOLECYSTECTOMY  10/22/2011   Procedure: LAPAROSCOPIC CHOLECYSTECTOMY;  Surgeon: Shelly Rubenstein, MD;  Location: [facility];  Service: General;  Laterality: N/A;   DILATION AND EVACUATION N/A 12/07/2019   Procedure: DILATATION AND EVACUATION;  Surgeon: Catalina Antigua, MD;  Location: [facility];  Service: Gynecology;  Laterality: N/A;  NEEDS ULTRASOUND GUIDANCE ANORA KIT SENT    HYSTEROSCOPY WITH RESECTOSCOPE N/A 04/28/2013   Procedure:  HYSTEROSCOPY ;  Surgeon: Genia Del, MD;  Location: [facility];  Service: Gynecology;  Laterality: N/A;   LAPAROSCOPY N/A 04/28/2013   Procedure: LAPAROSCOPY DIAGNOSTIC;  Surgeon: Genia Del, MD;  Location: [facility];  Service: Gynecology;  Laterality: N/A;  2 hrs. total   OPERATIVE ULTRASOUND N/A 12/07/2019   Procedure: OPERATIVE  ULTRASOUND;  Surgeon: Catalina Antigua, MD;  Location: [facility];  Service: Gynecology;  Laterality: N/A;   WISDOM TOOTH EXTRACTION  19yrs ago   Patient Active Problem List   Diagnosis Date Noted   SVD (spontaneous vaginal delivery) 04/19/2021   Hypokalemia 04/06/2021   Ehlers-Danlos syndrome 02/06/2021   Missed abortion    Female infertility 08/17/2019    PCP: None indicated on file  REFERRING PROVIDER: Kathryne Hitch, MD  REFERRING DIAG: M54.2 (ICD-10-CM) - Cervicalgia  THERAPY DIAG:  Abnormal posture  Cervicalgia  Bilateral shoulder pain, unspecified chronicity  Cramp and spasm  Rationale for Evaluation and Treatment: Rehabilitation  ONSET DATE: May/June 2024  SUBJECTIVE:  SUBJECTIVE STATEMENT: I was doing really well last week with exercises and then I didn't do as much late in the week.  I had long lasting relief of symptoms after last DN.   Hand dominance: Right  PERTINENT HISTORY:  Carpal tunnel L hand  PAIN: 10/22/22 Are you having pain? Yes: NPRS scale: 1-2/10 Pain location: lower cervical, top of shoulders with cervical movement Pain description: Irritating, dull Aggravating factors: certain movements reaching and lifting Relieving factors: stretching  PRECAUTIONS: None  RED FLAGS: None   WEIGHT BEARING RESTRICTIONS: No  FALLS:  Has patient fallen in last 6 months? No  LIVING ENVIRONMENT: Lives with: lives with their family Lives in: House/apartment  OCCUPATION: Hair stylist  PLOF: Independent  PATIENT GOALS: Wants exercises to help address posture/neck/shoulders  NEXT MD VISIT: n/a  OBJECTIVE:   DIAGNOSTIC FINDINGS:  N/a  PATIENT SURVEYS:  FOTO 75; predicted 80 visit #9 10/08/22: 74- goal met and FOTO removed    COGNITION: Overall cognitive status: Within functional limits for tasks assessed  SENSATION: WFL  POSTURE: rounded shoulders, forward head, and decreased thoracic kyphosis  PALPATION: Increased muscle spasm bilat UTs, tender with C7 PAs   CERVICAL ROM:   Active ROM A/PROM (deg) eval  Flexion 45  Extension 60  Right lateral flexion 35  Left lateral flexion 35*  Right rotation 50  Left rotation 50   (Blank rows = not tested) * = pain    UPPER EXTREMITY MMT:  MMT Right eval Left eval  Shoulder flexion 4 4  Shoulder abduction 4+ 4+  Shoulder adduction    Shoulder extension    Shoulder internal rotation 5 5  Shoulder external rotation 3+ 3+  Middle trapezius 3+ 3+  Lower trapezius 3- 2+  Elbow flexion    Elbow extension    Wrist flexion    Wrist extension    Wrist ulnar deviation    Wrist radial deviation    Wrist pronation    Wrist supination    Grip strength     (Blank rows = not tested)  CERVICAL SPECIAL TESTS:  Neck flexor muscle endurance test: Positive  FUNCTIONAL TESTS:  Did not assess  TODAY'S TREATMENT:    DATE:  10/22/22 UBE L 2.5 x 3 min fwd, 3 min bwd- PT present to discuss progress  Advanced to green band:  Horizontal abduction, ER and W 2x10 Rows and shoulder extension Manual: STM to Left levator scap and bil paraspinals  Trigger Point Dry-Needling  Treatment instructions: Expect mild to moderate muscle soreness. S/S of pneumothorax if dry needled over a lung field, and to seek immediate medical attention should they occur. Patient verbalized understanding of these instructions and education.  Patient Consent Given: Yes Education handout provided: Previously provided Muscles treated: Bil cervical multifidi, upper traps and bil levator scap Treatment response/outcome: Utilized skilled palpation to identify trigger points.  During dry needling able to palpate muscle twitch and muscle elongation; twitch response R UT  Skilled palpation  and monitoring by PT during dry needling     DATE:  10/08/22 UBE L 2.5 x 3 min fwd, 3 min bwd- PT present to discuss progress  Scap retraction in quadruped 2x10 ER Lt only x 10 Prone I, Y, T, W 2 x 10  each Seated scalene stretch with strap 2x30 sec ea Manual: STM to Left levator scap and bil paraspinals  Trigger Point Dry-Needling  Treatment instructions: Expect mild to moderate muscle soreness. S/S of pneumothorax if dry needled over a lung field,  and to seek immediate medical attention should they occur. Patient verbalized understanding of these instructions and education.  Patient Consent Given: Yes Education handout provided: Previously provided Muscles treated: B cervical multifidi, upper traps and Lt levator scap Treatment response/outcome: Utilized skilled palpation to identify trigger points.  During dry needling able to palpate muscle twitch and muscle elongation; twitch response R UT  Skilled palpation and monitoring by PT during dry needling  DATE:  09/24/22 UBE L 2.5 x 3 min fwd, 2 min bwd W and ER with retraction red x 10 ea ER in door red x 10 Prone I, Y, T, W 2 x 10  ea Prone lat pull on sliders - OH flexion pushing thru sliders and pulling to armpits Prone on elbows chin tucks 5 sec hold x 5 Thoracic cat/cow x 5 Seated scalene stretch with strap 2x30 sec ea Manual: STM to Left levator scap, PA mobs thoracic spine Trigger Point Dry-Needling  Treatment instructions: Expect mild to moderate muscle soreness. S/S of pneumothorax if dry needled over a lung field, and to seek immediate medical attention should they occur. Patient verbalized understanding of these instructions and education.  Patient Consent Given: Yes Education handout provided: Previously provided Muscles treated: B cervical multifidi, upper traps and L levator scap Treatment response/outcome: Utilized skilled palpation to identify trigger points.  During dry needling able to palpate muscle twitch and  muscle elongation; twitch response R UT  Skilled palpation and monitoring by PT during dry needling    PATIENT EDUCATION:  Education details: Exam findings, mechanics with her midback strengthening to avoid upper trap overactivity and pec compensation, initial HEP Person educated: Patient Education method: Explanation, Demonstration, and Handouts Education comprehension: verbalized understanding, returned demonstration, and needs further education  HOME EXERCISE PROGRAM: Access Code: [email or web address] URL: [email or web address] Date: 09/24/2022 Prepared by: Raynelle Fanning  Exercises  - Seated Scalene Stretch with Towel  - 1 x daily - 7 x weekly - 1 sets - 3 reps - 30 sec hold  ASSESSMENT:  CLINICAL IMPRESSION: Pt had good relief of symptoms after last session and reports that tension has stayed overall low since this.  PT advanced pt to green band with attached and unattached HEP.  Minor and intermittent verbal cues for scapular depression.  Good response to DN with improved tissue mobility and multiple twitch responses to DN.  Pt will attend 1-2 more sessions for DN and to finalize HEP. Patient will benefit from skilled PT to address the below impairments and improve overall function.   OBJECTIVE IMPAIRMENTS: decreased activity tolerance, decreased mobility, difficulty walking, decreased ROM, decreased strength, increased fascial restrictions, increased muscle spasms, impaired UE functional use, improper body mechanics, postural dysfunction, and pain.   ACTIVITY LIMITATIONS: carrying, lifting, standing, dressing, reach over head, hygiene/grooming, and caring for others  PARTICIPATION LIMITATIONS: meal prep, cleaning, laundry, community activity, and occupation  PERSONAL FACTORS: Age, Fitness, Past/current experiences, Profession, and Time since onset of injury/illness/exacerbation are also affecting patient's functional outcome.   REHAB POTENTIAL: Good  CLINICAL DECISION MAKING:  Stable/uncomplicated  EVALUATION COMPLEXITY: Low   GOALS: Goals reviewed with patient? Yes  SHORT TERM GOALS: Target date: 09/11/2022   Pt will be ind with initial HEP Baseline:  Goal status: MET  2.  Pt will report full pain and tension free cervical ROM Baseline:  Goal status: PARTIALLY MET   LONG TERM GOALS: Target date: 12/03/22   Pt will be ind with management and progression of HEP Baseline:  Goal status: in progress   2.  Pt will report decrease in pain by >/=70% with all activities Baseline: 50% better (10/08/22) Goal status: REVISED  3.  Pt will have improved FOTO score to 80 Baseline: 82 (10/08/22) Goal status: MET  4.  Pt will demo at least 4/5 midback strength for improved postural stability Baseline:  Goal status: In progress   5.  Pt will be able to reach and lift at least 25# to pick up and carry her child without pain exacerbation Baseline:  some increased pain with repetition (10/08/22) Goal status: In progress     PLAN:  PT FREQUENCY:  1x/wk initially and then transition to every other week due to financial constraints  PT DURATION: 8 weeks  PLANNED INTERVENTIONS: Therapeutic exercises, Therapeutic activity, Neuromuscular re-education, Balance training, Gait training, Patient/Family education, Self Care, Joint mobilization, Dry Needling, Electrical stimulation, Spinal mobilization, Cryotherapy, Moist heat, Taping, Vasopneumatic device, Ultrasound, Ionotophoresis 4mg /ml Dexamethasone, Manual therapy, and Re-evaluation  PLAN FOR NEXT SESSION: 1-2 more sessions between now and 12/03/22.  Postural strength, manual to address tissue mobility.    Lorrene Reid, PT 10/22/22 4:20 PM   [facility] [address], [address] [place], [place] [address] Phone # [phone] Fax [phone]

## 2022-11-12 ENCOUNTER — Ambulatory Visit: Payer: 59 | Admitting: Clinical

## 2022-11-12 DIAGNOSIS — F411 Generalized anxiety disorder: Secondary | ICD-10-CM

## 2022-11-12 NOTE — Progress Notes (Signed)
Diagnosis: F41.1 Time: 10:03am-10:59am CPT Code: [id number]  Krista Lee was seen remotely using secure video conferencing. She was in her home in [place] and the therapist was in her office at the time of the appointment. Client is aware of risks of telehealth and consented to a virtual visit. Session focused on processing her feelings of anger, grief, and fear about the election results. Therapist encouraged her to manage social media use and look for ways to feel grounded in the present moment. She is scheduled to be seen again in December.    Treatment Plan Client Abilities/Strengths  Krista Lee presented as insightful and motivated, with clear goals for therapy.  Client Treatment Preferences  Krista Lee would like to begin with monthly appointments, and increase frequency once she has hit her insurance deductible.  Client Statement of Needs  Krista Lee is seeking CBT to address anxiety and relationship challenges.  Treatment Level  Monthly  Symptoms  Anxiety : vivid, persistent thoughts of worst case scenarios that are difficult to disengage from (Status: maintained). Relational Challenges: Difficulty with mutual triggering of uncomfortable emotions in marriage (Status: maintained).  Problems Addressed  New Description, New Description  Goals 1. Difficulty in relationship with husband Objective Development of strategies to regulate emotional response to husband's stress Target Date: 2023-01-09 Frequency: Biweekly  Progress: 0 Modality: individual  Related Interventions Therapist will work with Krista Lee to develop communication strategies by helping her to consider how she might respond to different situations and offering opportunities to practice, such as through written exercises and role plays Therapist will work with Krista Lee to develop her communication strategies, including labeling and expressing her emotions and using "I" statements 2. Persistent anxiety triggered by thoughts of worse case  scenarios that feel real Objective Decrease in frequency and severity of anxiety, increase in overall well being Target Date: 2023-01-09 Frequency: Biweekly  Progress: 0 Modality: individual  Related Interventions Therapist will provide referrals for additional resources as appropriate Therapist will provide Krista Lee with strategies to regulate her emotions, including meditation, breathing exercises, mindfulness, and self-care. Therapist will help Krista Lee to identify and disengage from maladaptive thought patterns using CBT-based strategies Therapist will provide Krista Lee with opportunities to process her experiences in session Diagnosis Axis none 300.00 (Anxiety state, unspecified) - Open - [Signifier: n/a]    Conditions For Discharge Achievement of treatment goals and objectives   Krista Noa, PhD               Krista Noa, PhD               Krista Noa, PhD

## 2022-11-19 ENCOUNTER — Ambulatory Visit: Payer: Commercial Managed Care - HMO | Attending: Orthopaedic Surgery

## 2022-11-19 DIAGNOSIS — R252 Cramp and spasm: Secondary | ICD-10-CM | POA: Diagnosis present

## 2022-11-19 DIAGNOSIS — M542 Cervicalgia: Secondary | ICD-10-CM | POA: Diagnosis present

## 2022-11-19 DIAGNOSIS — M25511 Pain in right shoulder: Secondary | ICD-10-CM | POA: Diagnosis present

## 2022-11-19 DIAGNOSIS — R293 Abnormal posture: Secondary | ICD-10-CM

## 2022-11-19 DIAGNOSIS — M25512 Pain in left shoulder: Secondary | ICD-10-CM | POA: Diagnosis present

## 2022-11-19 NOTE — Therapy (Signed)
OUTPATIENT PHYSICAL THERAPY CERVICAL TREATMENT   [name] MRN: [id number] DOB:[birth year], 37 y.o., female Today's Date: 11/19/2022  END OF SESSION:  PT End of Session - 11/19/22 1655     Visit Number 8    Date for PT Re-Evaluation 12/03/22    Authorization Type [employer]    PT Start Time 1616    PT Stop Time 1656    PT Time Calculation (min) 40 min    Activity Tolerance Patient tolerated treatment well    Behavior During Therapy WFL for tasks assessed/performed                    Past Medical History:  Diagnosis Date   ADD (attention deficit disorder)    Allergy    no meds   Anemia    as adolescent   Anxiety    Depression    GERD (gastroesophageal reflux disease)    doesn't take any meds for this, diet controlled   Headache(784.0)    Heart murmur    as a child - no problems   History of bronchitis    as a teenager, no problems as adult   History of migraine    can't remember when the last one was   Insomnia    no meds   Panic attacks    Symptomatic cholelithiasis 09/25/2011   UTI (urinary tract infection)    Past Surgical History:  Procedure Laterality Date   CHOLECYSTECTOMY  10/22/2011   Procedure: LAPAROSCOPIC CHOLECYSTECTOMY;  Surgeon: Shelly Rubenstein, MD;  Location: [facility];  Service: General;  Laterality: N/A;   DILATION AND EVACUATION N/A 12/07/2019   Procedure: DILATATION AND EVACUATION;  Surgeon: Catalina Antigua, MD;  Location: [facility];  Service: Gynecology;  Laterality: N/A;  NEEDS ULTRASOUND GUIDANCE ANORA KIT SENT    HYSTEROSCOPY WITH RESECTOSCOPE N/A 04/28/2013   Procedure:  HYSTEROSCOPY ;  Surgeon: Genia Del, MD;  Location: [facility];  Service: Gynecology;  Laterality: N/A;   LAPAROSCOPY N/A 04/28/2013   Procedure: LAPAROSCOPY DIAGNOSTIC;  Surgeon: Genia Del, MD;  Location: [facility];  Service: Gynecology;  Laterality: N/A;  2 hrs. total   OPERATIVE ULTRASOUND N/A 12/07/2019   Procedure:  OPERATIVE ULTRASOUND;  Surgeon: Catalina Antigua, MD;  Location: [facility];  Service: Gynecology;  Laterality: N/A;   WISDOM TOOTH EXTRACTION  21yrs ago   Patient Active Problem List   Diagnosis Date Noted   SVD (spontaneous vaginal delivery) 04/19/2021   Hypokalemia 04/06/2021   Ehlers-Danlos syndrome 02/06/2021   Missed abortion    Female infertility 08/17/2019    PCP: None indicated on file  REFERRING PROVIDER: Kathryne Hitch, MD  REFERRING DIAG: M54.2 (ICD-10-CM) - Cervicalgia  THERAPY DIAG:  Abnormal posture  Cervicalgia  Bilateral shoulder pain, unspecified chronicity  Cramp and spasm  Rationale for Evaluation and Treatment: Rehabilitation  ONSET DATE: May/June 2024  SUBJECTIVE:  SUBJECTIVE STATEMENT: Overall pain is better and I don't remember as much to do my exercises because of that.  I feel 80% better overall.   Hand dominance: Right  PERTINENT HISTORY:  Carpal tunnel L hand  PAIN: 11/19/22 Are you having pain? Yes: NPRS scale: 1-2/10 Pain location: lower cervical, top of shoulders with cervical movement Pain description: Irritating, dull Aggravating factors: sleeping on my back, coughing Relieving factors: stretching  PRECAUTIONS: None  RED FLAGS: None   WEIGHT BEARING RESTRICTIONS: No  FALLS:  Has patient fallen in last 6 months? No  LIVING ENVIRONMENT: Lives with: lives with their family Lives in: House/apartment  OCCUPATION: Hair stylist  PLOF: Independent  PATIENT GOALS: Wants exercises to help address posture/neck/shoulders  NEXT MD VISIT: n/a  OBJECTIVE:   DIAGNOSTIC FINDINGS:  N/a  PATIENT SURVEYS:  FOTO 75; predicted 80 visit #9 10/08/22: 52- goal met and FOTO removed   COGNITION: Overall cognitive  status: Within functional limits for tasks assessed  SENSATION: WFL  POSTURE: rounded shoulders, forward head, and decreased thoracic kyphosis  PALPATION: Increased muscle spasm bilat UTs, tender with C7 PAs   CERVICAL ROM:   Active ROM A/PROM (deg) eval  Flexion 45  Extension 60  Right lateral flexion 35  Left lateral flexion 35*  Right rotation 50  Left rotation 50   (Blank rows = not tested) * = pain    UPPER EXTREMITY MMT:  MMT Right eval Left eval  Shoulder flexion 4 4  Shoulder abduction 4+ 4+  Shoulder adduction    Shoulder extension    Shoulder internal rotation 5 5  Shoulder external rotation 3+ 3+  Middle trapezius 3+ 3+  Lower trapezius 3- 2+  Elbow flexion    Elbow extension    Wrist flexion    Wrist extension    Wrist ulnar deviation    Wrist radial deviation    Wrist pronation    Wrist supination    Grip strength     (Blank rows = not tested)  CERVICAL SPECIAL TESTS:  Neck flexor muscle endurance test: Positive  FUNCTIONAL TESTS:  Did not assess  TODAY'S TREATMENT:    DATE:  11/19/22 UBE L 2.5 x 3 min fwd, 3 min bwd- PT present to discuss progress  Prone I, Y, T bil Manual: STM to bil levator scap and bil paraspinals  Trigger Point Dry-Needling  Treatment instructions: Expect mild to moderate muscle soreness. S/S of pneumothorax if dry needled over a lung field, and to seek immediate medical attention should they occur. Patient verbalized understanding of these instructions and education.  Patient Consent Given: Yes Education handout provided: Previously provided Muscles treated: Bil cervical multifidi, upper traps and bil levator scap, suboccipitas  Treatment response/outcome: Utilized skilled palpation to identify trigger points.  During dry needling able to palpate muscle twitch and muscle elongation; twitch response R UT  Skilled palpation and monitoring by PT during dry needling     10/22/22 UBE L 2.5 x 3 min fwd, 3 min bwd- PT  present to discuss progress  Advanced to green band:  Horizontal abduction, ER and W 2x10 Rows and shoulder extension Manual: STM to Left levator scap and bil paraspinals  Trigger Point Dry-Needling  Treatment instructions: Expect mild to moderate muscle soreness. S/S of pneumothorax if dry needled over a lung field, and to seek immediate medical attention should they occur. Patient verbalized understanding of these instructions and education.  Patient Consent Given: Yes Education handout provided: Previously provided Muscles treated: Bil cervical  multifidi, upper traps and bil levator scap Treatment response/outcome: Utilized skilled palpation to identify trigger points.  During dry needling able to palpate muscle twitch and muscle elongation; twitch response R UT  Skilled palpation and monitoring by PT during dry needling     DATE:  10/08/22 UBE L 2.5 x 3 min fwd, 3 min bwd- PT present to discuss progress  Scap retraction in quadruped 2x10 ER Lt only x 10 Prone I, Y, T, W 2 x 10  each Seated scalene stretch with strap 2x30 sec ea Manual: STM to Left levator scap and bil paraspinals  Trigger Point Dry-Needling  Treatment instructions: Expect mild to moderate muscle soreness. S/S of pneumothorax if dry needled over a lung field, and to seek immediate medical attention should they occur. Patient verbalized understanding of these instructions and education.  Patient Consent Given: Yes Education handout provided: Previously provided Muscles treated: B cervical multifidi, upper traps and Lt levator scap Treatment response/outcome: Utilized skilled palpation to identify trigger points.  During dry needling able to palpate muscle twitch and muscle elongation; twitch response R UT  Skilled palpation and monitoring by PT during dry needling   PATIENT EDUCATION:  Education details: Exam findings, mechanics with her midback strengthening to avoid upper trap overactivity and pec compensation,  initial HEP Person educated: Patient Education method: Explanation, Demonstration, and Handouts Education comprehension: verbalized understanding, returned demonstration, and needs further education  HOME EXERCISE PROGRAM: Access Code: [email or web address] URL: [email or web address] Date: 09/24/2022 Prepared by: Raynelle Fanning  Exercises  - Seated Scalene Stretch with Towel  - 1 x daily - 7 x weekly - 1 sets - 3 reps - 30 sec hold  ASSESSMENT:  CLINICAL IMPRESSION: Pt reports 80% overall improvement in symptoms since the start of care.  Pt is compliant with HEP 2-3 days/wk. Pt with good response to DN with improved tissue mobility and twitch response.  Pt will attend 1 more session for DN and to finalize HEP. Patient will benefit from skilled PT to address the below impairments and improve overall function.   OBJECTIVE IMPAIRMENTS: decreased activity tolerance, decreased mobility, difficulty walking, decreased ROM, decreased strength, increased fascial restrictions, increased muscle spasms, impaired UE functional use, improper body mechanics, postural dysfunction, and pain.   ACTIVITY LIMITATIONS: carrying, lifting, standing, dressing, reach over head, hygiene/grooming, and caring for others  PARTICIPATION LIMITATIONS: meal prep, cleaning, laundry, community activity, and occupation  PERSONAL FACTORS: Age, Fitness, Past/current experiences, Profession, and Time since onset of injury/illness/exacerbation are also affecting patient's functional outcome.   REHAB POTENTIAL: Good  CLINICAL DECISION MAKING: Stable/uncomplicated  EVALUATION COMPLEXITY: Low   GOALS: Goals reviewed with patient? Yes  SHORT TERM GOALS: Target date: 09/11/2022   Pt will be ind with initial HEP Baseline:  Goal status: MET  2.  Pt will report full pain and tension free cervical ROM Baseline:  Goal status: PARTIALLY MET   LONG TERM GOALS: Target date: 12/03/22   Pt will be ind with management and  progression of HEP Baseline:  Goal status: in progress   2.  Pt will report decrease in pain by >/=70% with all activities Baseline: 50% better (10/08/22) Goal status: REVISED  3.  Pt will have improved FOTO score to 80 Baseline: 82 (10/08/22) Goal status: MET  4.  Pt will demo at least 4/5 midback strength for improved postural stability Baseline:  Goal status: In progress   5.  Pt will be able to reach and lift at least 25# to pick up and carry her child  without pain exacerbation Baseline:  some increased pain with repetition (10/08/22) Goal status: In progress     PLAN:  PT FREQUENCY:  1x/wk initially and then transition to every other week due to financial constraints  PT DURATION: 8 weeks  PLANNED INTERVENTIONS: Therapeutic exercises, Therapeutic activity, Neuromuscular re-education, Balance training, Gait training, Patient/Family education, Self Care, Joint mobilization, Dry Needling, Electrical stimulation, Spinal mobilization, Cryotherapy, Moist heat, Taping, Vasopneumatic device, Ultrasound, Ionotophoresis 4mg /ml Dexamethasone, Manual therapy, and Re-evaluation  PLAN FOR NEXT SESSION: 1 more session.  Postural strength, manual to address tissue mobility.    Lorrene Reid, PT 11/19/22 4:56 PM   [facility] [address], [address] [place], [place] [address] Phone # [phone] Fax [phone]

## 2022-12-03 ENCOUNTER — Ambulatory Visit: Payer: Commercial Managed Care - HMO | Attending: Orthopaedic Surgery

## 2022-12-03 DIAGNOSIS — M25512 Pain in left shoulder: Secondary | ICD-10-CM

## 2022-12-03 DIAGNOSIS — R293 Abnormal posture: Secondary | ICD-10-CM

## 2022-12-03 DIAGNOSIS — M25511 Pain in right shoulder: Secondary | ICD-10-CM | POA: Insufficient documentation

## 2022-12-03 DIAGNOSIS — M542 Cervicalgia: Secondary | ICD-10-CM

## 2022-12-03 DIAGNOSIS — R252 Cramp and spasm: Secondary | ICD-10-CM

## 2022-12-03 DIAGNOSIS — M6281 Muscle weakness (generalized): Secondary | ICD-10-CM

## 2022-12-03 NOTE — Therapy (Signed)
OUTPATIENT PHYSICAL THERAPY CERVICAL TREATMENT   [name] MRN: [id number] DOB:[birth year], 37 y.o., female Today's Date: 12/03/2022  END OF SESSION:  PT End of Session - 12/03/22 1655     Visit Number 9    Authorization Type [employer]    PT Start Time 1617    PT Stop Time 1657    PT Time Calculation (min) 40 min    Activity Tolerance Patient tolerated treatment well    Behavior During Therapy WFL for tasks assessed/performed                     Past Medical History:  Diagnosis Date   ADD (attention deficit disorder)    Allergy    no meds   Anemia    as adolescent   Anxiety    Depression    GERD (gastroesophageal reflux disease)    doesn't take any meds for this, diet controlled   Headache(784.0)    Heart murmur    as a child - no problems   History of bronchitis    as a teenager, no problems as adult   History of migraine    can't remember when the last one was   Insomnia    no meds   Panic attacks    Symptomatic cholelithiasis 09/25/2011   UTI (urinary tract infection)    Past Surgical History:  Procedure Laterality Date   CHOLECYSTECTOMY  10/22/2011   Procedure: LAPAROSCOPIC CHOLECYSTECTOMY;  Surgeon: Shelly Rubenstein, MD;  Location: [facility];  Service: General;  Laterality: N/A;   DILATION AND EVACUATION N/A 12/07/2019   Procedure: DILATATION AND EVACUATION;  Surgeon: Catalina Antigua, MD;  Location: [facility];  Service: Gynecology;  Laterality: N/A;  NEEDS ULTRASOUND GUIDANCE ANORA KIT SENT    HYSTEROSCOPY WITH RESECTOSCOPE N/A 04/28/2013   Procedure:  HYSTEROSCOPY ;  Surgeon: Genia Del, MD;  Location: [facility];  Service: Gynecology;  Laterality: N/A;   LAPAROSCOPY N/A 04/28/2013   Procedure: LAPAROSCOPY DIAGNOSTIC;  Surgeon: Genia Del, MD;  Location: [facility];  Service: Gynecology;  Laterality: N/A;  2 hrs. total   OPERATIVE ULTRASOUND N/A 12/07/2019   Procedure: OPERATIVE ULTRASOUND;  Surgeon: Catalina Antigua, MD;  Location: [facility];  Service: Gynecology;  Laterality: N/A;   WISDOM TOOTH EXTRACTION  15yrs ago   Patient Active Problem List   Diagnosis Date Noted   SVD (spontaneous vaginal delivery) 04/19/2021   Hypokalemia 04/06/2021   Ehlers-Danlos syndrome 02/06/2021   Missed abortion    Female infertility 08/17/2019    PCP: None indicated on file  REFERRING PROVIDER: Kathryne Hitch, MD  REFERRING DIAG: M54.2 (ICD-10-CM) - Cervicalgia  THERAPY DIAG:  Abnormal posture  Cervicalgia  Bilateral shoulder pain, unspecified chronicity  Cramp and spasm  Muscle weakness (generalized)  Rationale for Evaluation and Treatment: Rehabilitation  ONSET DATE: May/June 2024  SUBJECTIVE:  SUBJECTIVE STATEMENT: Ready to D/C to HEP today.   Hand dominance: Right  PERTINENT HISTORY:  Carpal tunnel L hand  PAIN: 12/27/22 Are you having pain? Yes: NPRS scale: 1-2/10 Pain location: lower cervical, top of shoulders with cervical movement Pain description: Irritating, dull Aggravating factors: sleeping on my back, coughing Relieving factors: stretching  PRECAUTIONS: None  RED FLAGS: None   WEIGHT BEARING RESTRICTIONS: No  FALLS:  Has patient fallen in last 6 months? No  LIVING ENVIRONMENT: Lives with: lives with their family Lives in: House/apartment  OCCUPATION: Hair stylist  PLOF: Independent  PATIENT GOALS: Wants exercises to help address posture/neck/shoulders  NEXT MD VISIT: n/a  OBJECTIVE:   DIAGNOSTIC FINDINGS:  N/a  PATIENT SURVEYS:  FOTO 75; predicted 80 visit #9 10/08/22: 4- goal met and FOTO removed   COGNITION: Overall cognitive status: Within functional limits for tasks assessed  SENSATION: WFL  POSTURE: rounded shoulders,  forward head, and decreased thoracic kyphosis  PALPATION: Increased muscle spasm bilat UTs, tender with C7 PAs   CERVICAL ROM:   Active ROM A/PROM (deg) eval  Flexion 45  Extension 60  Right lateral flexion 35  Left lateral flexion 35*  Right rotation 50  Left rotation 50   (Blank rows = not tested) * = pain    UPPER EXTREMITY MMT:  MMT Right eval Left eval  Shoulder flexion 4 4  Shoulder abduction 4+ 4+  Shoulder adduction    Shoulder extension    Shoulder internal rotation 5 5  Shoulder external rotation 3+ 3+  Middle trapezius 3+ 3+  Lower trapezius 3- 2+  Elbow flexion    Elbow extension    Wrist flexion    Wrist extension    Wrist ulnar deviation    Wrist radial deviation    Wrist pronation    Wrist supination    Grip strength     (Blank rows = not tested)  CERVICAL SPECIAL TESTS:  Neck flexor muscle endurance test: Positive  FUNCTIONAL TESTS:  Did not assess  TODAY'S TREATMENT:    DATE:  27-Dec-2022 Deadlift and single leg deadlift Review of all HEP Trigger Point Dry-Needling  Treatment instructions: Expect mild to moderate muscle soreness. S/S of pneumothorax if dry needled over a lung field, and to seek immediate medical attention should they occur. Patient verbalized understanding of these instructions and education.  Patient Consent Given: Yes Education handout provided: Previously provided Muscles treated: Bil cervical multifidi, upper traps and bil levator scap, suboccipitas  Treatment response/outcome: Utilized skilled palpation to identify trigger points.  During dry needling able to palpate muscle twitch and muscle elongation; twitch response Rt UT  Skilled palpation and monitoring by PT during dry needling     11/19/22 UBE L 2.5 x 3 min fwd, 3 min bwd- PT present to discuss progress  Prone I, Y, T bil Manual: STM to bil levator scap and bil paraspinals  Trigger Point Dry-Needling  Treatment instructions: Expect mild to moderate muscle  soreness. S/S of pneumothorax if dry needled over a lung field, and to seek immediate medical attention should they occur. Patient verbalized understanding of these instructions and education.  Patient Consent Given: Yes Education handout provided: Previously provided Muscles treated: Bil cervical multifidi, upper traps and bil levator scap, suboccipitas  Treatment response/outcome: Utilized skilled palpation to identify trigger points.  During dry needling able to palpate muscle twitch and muscle elongation; twitch response R UT  Skilled palpation and monitoring by PT during dry needling     10/22/22  UBE L 2.5 x 3 min fwd, 3 min bwd- PT present to discuss progress  Advanced to green band:  Horizontal abduction, ER and W 2x10 Rows and shoulder extension Manual: STM to Left levator scap and bil paraspinals  Trigger Point Dry-Needling  Treatment instructions: Expect mild to moderate muscle soreness. S/S of pneumothorax if dry needled over a lung field, and to seek immediate medical attention should they occur. Patient verbalized understanding of these instructions and education.  Patient Consent Given: Yes Education handout provided: Previously provided Muscles treated: Bil cervical multifidi, upper traps and bil levator scap Treatment response/outcome: Utilized skilled palpation to identify trigger points.  During dry needling able to palpate muscle twitch and muscle elongation; twitch response R UT  Skilled palpation and monitoring by PT during dry needling     DATE:  10/08/22 UBE L 2.5 x 3 min fwd, 3 min bwd- PT present to discuss progress  Scap retraction in quadruped 2x10 ER Lt only x 10 Prone I, Y, T, W 2 x 10  each Seated scalene stretch with strap 2x30 sec ea Manual: STM to Left levator scap and bil paraspinals  Trigger Point Dry-Needling  Treatment instructions: Expect mild to moderate muscle soreness. S/S of pneumothorax if dry needled over a lung field, and to seek immediate  medical attention should they occur. Patient verbalized understanding of these instructions and education.  Patient Consent Given: Yes Education handout provided: Previously provided Muscles treated: B cervical multifidi, upper traps and Lt levator scap Treatment response/outcome: Utilized skilled palpation to identify trigger points.  During dry needling able to palpate muscle twitch and muscle elongation; twitch response R UT  Skilled palpation and monitoring by PT during dry needling   PATIENT EDUCATION:  Education details: Exam findings, mechanics with her midback strengthening to avoid upper trap overactivity and pec compensation, initial HEP Person educated: Patient Education method: Explanation, Demonstration, and Handouts Education comprehension: verbalized understanding, returned demonstration, and needs further education  HOME EXERCISE PROGRAM: Access Code: [email or web address] URL: [email or web address] Date: 09/24/2022 Prepared by: Raynelle Fanning  Exercises  - Seated Scalene Stretch with Towel  - 1 x daily - 7 x weekly - 1 sets - 3 reps - 30 sec hold  ASSESSMENT:  CLINICAL IMPRESSION: Pt reports 80% overall improvement in symptoms since the start of care. Review of all HEP today.  Pt is compliant with HEP and will continue after D/C.  Pt with good response to DN with improved tissue mobility and twitch response.    OBJECTIVE IMPAIRMENTS: decreased activity tolerance, decreased mobility, difficulty walking, decreased ROM, decreased strength, increased fascial restrictions, increased muscle spasms, impaired UE functional use, improper body mechanics, postural dysfunction, and pain.   ACTIVITY LIMITATIONS: carrying, lifting, standing, dressing, reach over head, hygiene/grooming, and caring for others  PARTICIPATION LIMITATIONS: meal prep, cleaning, laundry, community activity, and occupation  PERSONAL FACTORS: Age, Fitness, Past/current experiences, Profession, and Time since  onset of injury/illness/exacerbation are also affecting patient's functional outcome.   REHAB POTENTIAL: Good  CLINICAL DECISION MAKING: Stable/uncomplicated  EVALUATION COMPLEXITY: Low   GOALS: Goals reviewed with patient? Yes  SHORT TERM GOALS: Target date: 09/11/2022   Pt will be ind with initial HEP Baseline:  Goal status: MET  2.  Pt will report full pain and tension free cervical ROM Baseline:  Goal status: PARTIALLY MET   LONG TERM GOALS: Target date: 12/03/22   Pt will be ind with management and progression of HEP Baseline:  Goal status: in progress   2.  Pt will  report decrease in pain by >/=70% with all activities Baseline: 50% better (10/08/22) Goal status: REVISED  3.  Pt will have improved FOTO score to 80 Baseline: 82 (10/08/22) Goal status: MET  4.  Pt will demo at least 4/5 midback strength for improved postural stability Baseline:  Goal status: In progress   5.  Pt will be able to reach and lift at least 25# to pick up and carry her child without pain exacerbation Baseline:  some increased pain with repetition (10/08/22) Goal status: In progress     PLAN:  PT FREQUENCY:  1x/wk initially and then transition to every other week due to financial constraints  PT DURATION: 8 weeks  PLANNED INTERVENTIONS: Therapeutic exercises, Therapeutic activity, Neuromuscular re-education, Balance training, Gait training, Patient/Family education, Self Care, Joint mobilization, Dry Needling, Electrical stimulation, Spinal mobilization, Cryotherapy, Moist heat, Taping, Vasopneumatic device, Ultrasound, Ionotophoresis 4mg /ml Dexamethasone, Manual therapy, and Re-evaluation  PLAN FOR NEXT SESSION: 1 more session.  Postural strength, manual to address tissue mobility.    Lorrene Reid, PT 12/03/22 4:59 PM  PHYSICAL THERAPY DISCHARGE SUMMARY  Visits from Start of Care: 9  Current functional level related to goals / functional outcomes: Pt reports 80% improvement  in symptoms since the start of care.  Pt has HEP in place to address flexibility and strength and she verbalizes understanding of how to progress.     Remaining deficits: Muscle tension and minimal pain at times with repetitive work tasks or lifting.    Education / Equipment: HEP, postural education    Patient agrees to discharge. Patient goals were met. Patient is being discharged due to meeting the stated rehab goals.  Lorrene Reid, PT 12/03/22 4:59 PM   [facility] [address] [place], [place] [address] Phone # [phone] Fax [phone]

## 2022-12-17 ENCOUNTER — Ambulatory Visit (INDEPENDENT_AMBULATORY_CARE_PROVIDER_SITE_OTHER): Payer: Commercial Managed Care - HMO | Admitting: Clinical

## 2022-12-17 DIAGNOSIS — F411 Generalized anxiety disorder: Secondary | ICD-10-CM | POA: Diagnosis not present

## 2022-12-17 NOTE — Progress Notes (Signed)
Diagnosis: F41.1 Time: 10:00am-10:54 am CPT Code: [id number]  Krista Lee was seen remotely using secure video conferencing. Session began via caregility but transitioned to phone due to connectivity issues. She was in a parked car off of [address] in [employer]. and the therapist was in her office at the time of the appointment. Client is aware of risks of telehealth and consented to a virtual visit. Session focused on challenges in her marriage. Therapist offered validation and support, and suggested communication strategies. [name] also requested referral options for psychological evaluations, which therapist provided. She is scheduled to be seen again in January.  Treatment Plan Client Abilities/Strengths  [name] presented as insightful and motivated, with clear goals for therapy.  Client Treatment Preferences  Krista Lee would like to begin with monthly appointments, and increase frequency once she has hit her insurance deductible.  Client Statement of Needs  Krista Lee is seeking CBT to address anxiety and relationship challenges.  Treatment Level  Monthly  Symptoms  Anxiety : vivid, persistent thoughts of worst case scenarios that are difficult to disengage from (Status: maintained). Relational Challenges: Difficulty with mutual triggering of uncomfortable emotions in marriage (Status: maintained).  Problems Addressed  New Description, New Description  Goals 1. Difficulty in relationship with husband Objective Development of strategies to regulate emotional response to husband's stress Target Date: 2023-01-09 Frequency: Biweekly  Progress: 0 Modality: individual  Related Interventions Therapist will work with [name] to develop communication strategies by helping her to consider how she might respond to different situations and offering opportunities to practice, such as through written exercises and role plays Therapist will work with [name] to develop her communication strategies, including labeling  and expressing her emotions and using "I" statements 2. Persistent anxiety triggered by thoughts of worse case scenarios that feel real Objective Decrease in frequency and severity of anxiety, increase in overall well being Target Date: 2023-01-09 Frequency: Biweekly  Progress: 0 Modality: individual  Related Interventions Therapist will provide referrals for additional resources as appropriate Therapist will provide [name] with strategies to regulate her emotions, including meditation, breathing exercises, mindfulness, and self-care. Therapist will help [name] to identify and disengage from maladaptive thought patterns using CBT-based strategies Therapist will provide [name] with opportunities to process her experiences in session Diagnosis Axis none 300.00 (Anxiety state, unspecified) - Open - [Signifier: n/a]    Conditions For Discharge Achievement of treatment goals and objectives   [name], PhD                       [name], PhD             [name], PhD

## 2023-01-28 ENCOUNTER — Ambulatory Visit: Payer: Commercial Managed Care - HMO | Admitting: Clinical

## 2023-01-28 DIAGNOSIS — F411 Generalized anxiety disorder: Secondary | ICD-10-CM

## 2023-01-28 NOTE — Progress Notes (Signed)
Diagnosis: F41.1 Time: 10:00am-10:56 am CPT Code: 78295A-21  Katelind was seen remotely using secure video conferencing.She was in her home and the therapist was in her office at the time of the appointment. Client is aware of risks of telehealth and consented to a virtual visit. She reported that her anxiety continues to be manageable overall, and session focused on several incidents that had occurred in her relationship. She expressed interest in a psychological evaluation and therapist helped to coordinate this. She is scheduled to be seen again in March.  Treatment Plan Client Abilities/Strengths  Zorina presented as insightful and motivated, with clear goals for therapy.  Client Treatment Preferences  Therma would like to begin with monthly appointments, and increase frequency once she has hit her insurance deductible.  Client Statement of Needs  Everlean is seeking CBT to address anxiety and relationship challenges.  Treatment Level  Monthly  Symptoms  Anxiety : vivid, persistent thoughts of worst case scenarios that are difficult to disengage from (Status: maintained). Relational Challenges: Difficulty with mutual triggering of uncomfortable emotions in marriage (Status: maintained).  Problems Addressed  New Description, New Description  Goals 1. Difficulty in relationship with husband Objective Development of strategies to regulate emotional response to husband's stress Target Date: 2024-01-09 Frequency: Biweekly  Progress: 0 Modality: individual  Related Interventions Therapist will work with [name] to develop communication strategies by helping her to consider how she might respond to different situations and offering opportunities to practice, such as through written exercises and role plays Therapist will work with [name] to develop her communication strategies, including labeling and expressing her emotions and using "I" statements 2. Persistent anxiety triggered by thoughts of worse  case scenarios that feel real Objective Decrease in frequency and severity of anxiety, increase in overall well being Target Date: 2024-01-09 Frequency: Biweekly  Progress: 0 Modality: individual  Related Interventions Therapist will provide referrals for additional resources as appropriate Therapist will provide Makalyn with strategies to regulate her emotions, including meditation, breathing exercises, mindfulness, and self-care. Therapist will help Nikitta to identify and disengage from maladaptive thought patterns using CBT-based strategies Therapist will provide Gerrica with opportunities to process her experiences in session Diagnosis Axis none 300.00 (Anxiety state, unspecified) - Open - [Signifier: n/a]    Conditions For Discharge Achievement of treatment goals and objectives   [name], PhD                         [name], PhD               [name], PhD

## 2023-02-11 ENCOUNTER — Ambulatory Visit: Payer: Commercial Managed Care - HMO | Admitting: Advanced Practice Midwife

## 2023-02-18 ENCOUNTER — Ambulatory Visit: Payer: Commercial Managed Care - HMO | Admitting: Advanced Practice Midwife

## 2023-02-25 ENCOUNTER — Ambulatory Visit: Payer: 59 | Admitting: Clinical

## 2023-03-04 ENCOUNTER — Ambulatory Visit: Payer: Commercial Managed Care - HMO | Admitting: Obstetrics and Gynecology

## 2023-03-04 ENCOUNTER — Encounter: Payer: Self-pay | Admitting: Obstetrics and Gynecology

## 2023-03-04 VITALS — BP 103/67 | HR 68 | Ht 66.0 in | Wt 126.4 lb

## 2023-03-04 DIAGNOSIS — N8111 Cystocele, midline: Secondary | ICD-10-CM | POA: Diagnosis not present

## 2023-03-04 DIAGNOSIS — Z01419 Encounter for gynecological examination (general) (routine) without abnormal findings: Secondary | ICD-10-CM | POA: Diagnosis not present

## 2023-03-04 NOTE — Progress Notes (Deleted)
 39 y.o. GYN presents for AEX/PAP/STD screening.  C/o

## 2023-03-04 NOTE — Progress Notes (Signed)
 ANNUAL EXAM [name] MRN [id number]  Date of birth: [birth year] Chief Complaint:   Annual Exam  History of Present Illness:   Krista Lee is a 38 y.o. G2P1011 with Patient's last menstrual period was 02/14/2023. being seen today for a routine annual exam.  Current complaints:   Irregular periods - gave birth 04/2021. After stopping breastfeeding, cycles were q28d for several months, then started to vary 22-25 days. Gets ovulation pain so felt ovulation at ~day 13-14 but then her period would come much sooner than anticipated. Past cycle was 28 days, closer to her normal  Possible prolapse - uses a menstrual cup and felt cervix much lower than before; seems to be getting a little better   Upstream - 03/04/23 1914       Pregnancy Intention Screening   Does the patient want to become pregnant in the next year? Ok Either Way    Does the patient's partner want to become pregnant in the next year? Ok Either Way    Would the patient like to discuss contraceptive options today? No      Contraception Wrap Up   Current Method No Contraceptive Precautions    End Method No Contraception Precautions    Contraception Counseling Provided No    How was the end contraceptive method provided? N/A            The pregnancy intention screening data noted above was reviewed. Potential methods of contraception were discussed. The patient elected to proceed with No Contraception Precautions.   Last pap 01/12/19. Results were: NILM w/ HRHPV negative. H/O abnormal pap: no Last mammogram: n/a.  Last colonoscopy: n/a     04/10/2021    1:35 PM 02/06/2021    9:17 AM 09/26/2020   10:34 AM 10/27/2019    1:46 PM  Depression screen PHQ 2/9  Decreased Interest 0 0 0 0  Down, Depressed, Hopeless 0 0 0 0  PHQ - 2 Score 0 0 0 0  Altered sleeping 3 0 1   Tired, decreased energy 0 1 1   Change in appetite 0 0 0   Feeling bad or failure about yourself  0 0 0   Trouble concentrating 0 1 1    Moving slowly or fidgety/restless 0 0 0   Suicidal thoughts 0 0 0   PHQ-9 Score 3 2 3    Difficult doing work/chores Not difficult at all           04/10/2021    1:36 PM 02/06/2021    9:19 AM 09/26/2020   10:36 AM 10/27/2019    1:46 PM  GAD 7 : Generalized Anxiety Score  Nervous, Anxious, on Edge 0 0 2 1  Control/stop worrying 0 0 1 1  Worry too much - different things 0 0 1 0  Trouble relaxing 0 0 1 0  Restless 0 0 0 0  Easily annoyed or irritable 0 0 0 1  Afraid - awful might happen 0 0 1 0  Total GAD 7 Score 0 0 6 3     Review of Systems:   Pertinent items are noted in HPI Denies any headaches, blurred vision, fatigue, shortness of breath, chest pain, abdominal pain, abnormal vaginal discharge/itching/odor/irritation, problems with periods, bowel movements, urination, or intercourse unless otherwise stated above. Pertinent History Reviewed:  Reviewed past medical,surgical, social and family history.  Reviewed problem list, medications and allergies. Physical Assessment:   Vitals:   03/04/23 1112  BP: 103/67  Pulse:  68  Weight: 126 lb 6.4 oz (57.3 kg)  Height: 5\' 6"  (1.676 m)  Body mass index is 20.4 kg/m.        Physical Examination:   General appearance - well appearing, and in no distress  Mental status - alert, oriented to person, place, and time  Chest - respiratory effort normal  Heart - normal peripheral perfusion  Abdomen - soft, nontender, nondistended, no masses or organomegaly  Pelvic - VULVA: normal appearing vulva with no masses, tenderness or lesions  VAGINA: normal appearing vagina with normal color and discharge, no lesions. Split speculum exam - mild cystocele with leading edge 1.5-2cm from introitus. Normal TVL, no significant descensus of cervix with valsalva CERVIX: normal appearing cervix without discharge or lesions, no CMT  Chaperone present for exam  No results found for this or any previous visit (from the past 24 hours).  Assessment & Plan:   1) Well-Woman Exam Mammogram: @ 36yo, or sooner if problems Colonoscopy: @ 36yo, or sooner if problems Pap: Due 2026 GC/CT: Declined HIV/HCV: Declined Discussed normal cycle variability, would not recommend further work up at this time. If cycles <21d or > 35-38d would recommend re-evaluation  2) Cystocele Reviewed mild (stage I) findings on exam today Asymptomatic Reviewed avoidance of constipation, using proper lifting technique when lifting heavy objects, and PFPT can play a role in preventing/minimizing progression of prolapse  Labs/procedures today:   No orders of the defined types were placed in this encounter.   Meds: No orders of the defined types were placed in this encounter.  Follow-up: Return in about 1 year (around 03/03/2024) for annual exam or sooner as needed.  Lennart Pall, MD 03/04/2023 7:16 PM

## 2023-03-04 NOTE — Progress Notes (Signed)
 Pt presents for annual. Pt has questions about her hormone levels. Pt does not want any std testing.

## 2023-03-25 ENCOUNTER — Ambulatory Visit: Payer: Commercial Managed Care - HMO | Admitting: Clinical

## 2023-03-25 ENCOUNTER — Ambulatory Visit: Payer: Commercial Managed Care - HMO | Admitting: Psychology

## 2023-04-01 ENCOUNTER — Ambulatory Visit (INDEPENDENT_AMBULATORY_CARE_PROVIDER_SITE_OTHER): Payer: Commercial Managed Care - HMO | Admitting: Clinical

## 2023-04-01 DIAGNOSIS — F411 Generalized anxiety disorder: Secondary | ICD-10-CM | POA: Diagnosis not present

## 2023-04-01 NOTE — Progress Notes (Signed)
 Diagnosis: F41.1 Time: 10:00am-10:56 am CPT Code: [id number]-98  Krista Lee was seen remotely using secure video conferencing.She was in a parked car outside of her workplace, and the therapist was in her office at the time of the appointment. Client is aware of risks of telehealth and consented to a virtual visit. Krista Lee shared that she continues to do well overall. She reported upon several developments in her relationship, including several difficult but important conversations she had had with her husband. Therapist offered validation, support, and communication strategies. She is scheduled to be seen again in two months.  Treatment Plan Client Abilities/Strengths  Krista Lee presented as insightful and motivated, with clear goals for therapy.  Client Treatment Preferences  Krista Lee would like to begin with monthly appointments, and increase frequency once she has hit her insurance deductible.  Client Statement of Needs  Krista Lee is seeking CBT to address anxiety and relationship challenges.  Treatment Level  Monthly  Symptoms  Anxiety : vivid, persistent thoughts of worst case scenarios that are difficult to disengage from (Status: maintained). Relational Challenges: Difficulty with mutual triggering of uncomfortable emotions in marriage (Status: maintained).  Problems Addressed  New Description, New Description  Goals 1. Difficulty in relationship with husband Objective Development of strategies to regulate emotional response to husband's stress Target Date: 2024-01-09 Frequency: Biweekly  Progress: 0 Modality: individual  Related Interventions Therapist will work with Krista Lee to develop communication strategies by helping her to consider how she might respond to different situations and offering opportunities to practice, such as through written exercises and role plays Therapist will work with Krista Lee to develop her communication strategies, including labeling and expressing her emotions and using "I"  statements 2. Persistent anxiety triggered by thoughts of worse case scenarios that feel real Objective Decrease in frequency and severity of anxiety, increase in overall well being Target Date: 2024-01-09 Frequency: Biweekly  Progress: 0 Modality: individual  Related Interventions Therapist will provide referrals for additional resources as appropriate Therapist will provide Krista Lee with strategies to regulate her emotions, including meditation, breathing exercises, mindfulness, and self-care. Therapist will help Krista Lee to identify and disengage from maladaptive thought patterns using CBT-based strategies Therapist will provide Krista Lee with opportunities to process her experiences in session Diagnosis Axis none 300.00 (Anxiety state, unspecified) - Open - [Signifier: n/a]    Conditions For Discharge Achievement of treatment goals and objectives     [name], PhD               [name], PhD

## 2023-04-03 ENCOUNTER — Encounter: Payer: Self-pay | Admitting: Obstetrics and Gynecology

## 2023-04-05 ENCOUNTER — Other Ambulatory Visit: Payer: Self-pay

## 2023-04-05 DIAGNOSIS — N8111 Cystocele, midline: Secondary | ICD-10-CM

## 2023-04-05 NOTE — Progress Notes (Signed)
 Pt called back to check referral status, verbal order from Dr. Jolayne Panther to place referral.

## 2023-04-08 ENCOUNTER — Ambulatory Visit: Attending: Obstetrics and Gynecology | Admitting: Physical Therapy

## 2023-04-08 ENCOUNTER — Other Ambulatory Visit: Payer: Self-pay

## 2023-04-08 ENCOUNTER — Other Ambulatory Visit: Payer: Commercial Managed Care - HMO | Admitting: Psychology

## 2023-04-08 DIAGNOSIS — R279 Unspecified lack of coordination: Secondary | ICD-10-CM | POA: Diagnosis present

## 2023-04-08 DIAGNOSIS — N8111 Cystocele, midline: Secondary | ICD-10-CM | POA: Insufficient documentation

## 2023-04-08 DIAGNOSIS — M6281 Muscle weakness (generalized): Secondary | ICD-10-CM | POA: Insufficient documentation

## 2023-04-08 DIAGNOSIS — R293 Abnormal posture: Secondary | ICD-10-CM | POA: Diagnosis present

## 2023-04-08 NOTE — Therapy (Signed)
 OUTPATIENT PHYSICAL THERAPY FEMALE PELVIC EVALUATION   [name] MRN: [id number] DOB:[birth year], 38 y.o., female Today's Date: 04/08/2023  END OF SESSION:  PT End of Session - 04/08/23 1534     Visit Number 1    Date for PT Re-Evaluation 10/08/23    Authorization Type CIgna    PT Start Time 1530    PT Stop Time 1611    PT Time Calculation (min) 41 min    Activity Tolerance Patient tolerated treatment well    Behavior During Therapy WFL for tasks assessed/performed             Past Medical History:  Diagnosis Date   ADD (attention deficit disorder)    Allergy    no meds   Anemia    as adolescent   Anxiety    Depression    GERD (gastroesophageal reflux disease)    doesn't take any meds for this, diet controlled   Headache(784.0)    Heart murmur    as a child - no problems   History of bronchitis    as a teenager, no problems as adult   History of migraine    can't remember when the last one was   Insomnia    no meds   Panic attacks    Symptomatic cholelithiasis 09/25/2011   UTI (urinary tract infection)    Past Surgical History:  Procedure Laterality Date   CHOLECYSTECTOMY  10/22/2011   Procedure: LAPAROSCOPIC CHOLECYSTECTOMY;  Surgeon: Shelly Rubenstein, MD;  Location: [facility];  Service: General;  Laterality: N/A;   DILATION AND EVACUATION N/A 12/07/2019   Procedure: DILATATION AND EVACUATION;  Surgeon: Catalina Antigua, MD;  Location: [facility];  Service: Gynecology;  Laterality: N/A;  NEEDS ULTRASOUND GUIDANCE ANORA KIT SENT    HYSTEROSCOPY WITH RESECTOSCOPE N/A 04/28/2013   Procedure:  HYSTEROSCOPY ;  Surgeon: Genia Del, MD;  Location: [facility];  Service: Gynecology;  Laterality: N/A;   LAPAROSCOPY N/A 04/28/2013   Procedure: LAPAROSCOPY DIAGNOSTIC;  Surgeon: Genia Del, MD;  Location: [facility];  Service: Gynecology;  Laterality: N/A;  2 hrs. total   OPERATIVE ULTRASOUND N/A 12/07/2019   Procedure: OPERATIVE  ULTRASOUND;  Surgeon: Catalina Antigua, MD;  Location: [facility];  Service: Gynecology;  Laterality: N/A;   WISDOM TOOTH EXTRACTION  91yrs ago   Patient Active Problem List   Diagnosis Date Noted   Hypokalemia 04/06/2021   Ehlers-Danlos syndrome 02/06/2021   Missed abortion    Female infertility 08/17/2019    PCP: none per chart  REFERRING PROVIDER: Constant, Peggy, MD   REFERRING DIAG: N81.11 (ICD-10-CM) - Midline cystocele  THERAPY DIAG:  Muscle weakness (generalized)  Abnormal posture  Unspecified lack of coordination  Rationale for Evaluation and Treatment: Rehabilitation  ONSET DATE: 1 week ago symptoms started  SUBJECTIVE:  SUBJECTIVE STATEMENT: Has been having pressure/abdominal cramping/lowering feeling of bladder. Was diagnosed with grade 1 prolapse of bladder but was told it could be 10 years before symptoms start and did not have therapy, per pt. Pt reports does have history of pain with intercourse but this has resolved. Has had a baby since (04/2021), vaginally no tearing. ~12 mins of pushing.  History of constipation, doesn't have a lot of urges to have bowel movement  Water does drink a lot of water but reports she has a hard time absorbing water   PAIN:  Are you having pain? No   PRECAUTIONS: None  RED FLAGS: None   WEIGHT BEARING RESTRICTIONS: No  FALLS:  Has patient fallen in last 6 months? 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  ACTIVITY LEVEL : moderate (work and toddler, is trying to do a little more piliates or yoga)   PLOF: Independent  PATIENT GOALS: to have no pressure and stronger pelvic floor   PERTINENT HISTORY:  Ehlers-Danlos syndrome, depression, anxiety Sexual abuse: No  BOWEL MOVEMENT: Pain with bowel movement: No Type  of bowel movement:Type (Bristol Stool Scale) 2-3, Frequency every couple of days, and Strain not often  Fully empty rectum: Yes:   Leakage: No Pads: No Fiber supplement/laxative No  URINATION: Pain with urination: No Fully empty bladder: Yes:   Stream: Strong Urgency: No Frequency: not quicker than every 2 hours, not nightly  Leakage: none Pads: No  INTERCOURSE:  Ability to have vaginal penetration Yes  Pain with intercourse: none Dryness - sometimes Climax: not fully, usually externally  Marinoff Scale: 0/3 Does use lubricant   PREGNANCY: Vaginal deliveries 01 Tearing No Episiotomy No C-section deliveries 0 Currently pregnant No  PROLAPSE: Pressure and Bulge - not since Friday when started period    OBJECTIVE:  Note: Objective measures were completed at Evaluation unless otherwise noted.  DIAGNOSTIC FINDINGS:   COGNITION: Overall cognitive status: Within functional limits for tasks assessed     SENSATION: Light touch: Appears intact   FUNCTIONAL TESTS:  Functional squat -   GAIT: WFL  POSTURE: rounded shoulders, forward head, and anterior pelvic tilt   LUMBARAROM/PROM:  A/PROM A/PROM  eval  Flexion WFL  Extension WFL  Right lateral flexion WFL  Left lateral flexion WFL  Right rotation WFL  Left rotation WFL   (Blank rows = not tested)  LOWER EXTREMITY ROM:  WFL  LOWER EXTREMITY MMT:  Bil hips grossly 4/5; knees 5/5 PALPATION:    Pelvic Alignment: WFL  Abdominal: fascial restrictions throughout lower abdominal quadrants                External Perineal Exam: [facility]                             Internal Pelvic Floor: no TTP  Patient confirms identification and approves PT to assess internal pelvic floor and treatment Yes No emotional/communication barriers or cognitive limitation. Patient is motivated to learn. Patient understands and agrees with treatment goals and plan. PT explains patient will be examined in standing, sitting, and  lying down to see how their muscles and joints work. When they are ready, they will be asked to remove their underwear so PT can examine their perineum. The patient is also given the option of providing their own chaperone as one is not provided in our facility. The patient also has the right and is explained the right to defer or  refuse any part of the evaluation or treatment including the internal exam. With the patient's consent, PT will use one gloved finger to gently assess the muscles of the pelvic floor, seeing how well it contracts and relaxes and if there is muscle symmetry. After, the patient will get dressed and PT and patient will discuss exam findings and plan of care. PT and patient discuss plan of care, schedule, attendance policy and HEP activities.  PELVIC MMT:   MMT eval  Vaginal 3/5; 5s; 4 reps  Internal Anal Sphincter   External Anal Sphincter   Puborectalis   Diastasis Recti   (Blank rows = not tested)        TONE: WFL  PROLAPSE: Mild possible grade 1 laxity noted with second cough in hooklying   TODAY'S TREATMENT:                                                                                                                              DATE:   EVAL Examination completed, findings reviewed, pt educated on POC, HEP, and prolapse relief positions. Pt motivated to participate in PT and agreeable to attempt recommendations.     PATIENT EDUCATION:  Education details: [id number] Person educated: Patient Education method: Explanation, Demonstration, Tactile cues, Verbal cues, and Handouts Education comprehension: verbalized understanding, returned demonstration, verbal cues required, tactile cues required, and needs further education  HOME EXERCISE PROGRAM: [id number]  ASSESSMENT:  CLINICAL IMPRESSION: Patient is a 38 y.o. female  who was seen today for physical therapy evaluation and treatment for grade 1 cystocele. Pt reports pressure for a day, started her period and  symptoms have been better. Pt is very fearful they will return once period is over. Pt has 13 year old son and lifts him and his things all day and fearful to make things worse. 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 Patient consented to internal pelvic floor assessment vaginally this date and found to have decreased strength, endurance, and coordination. Patient benefited from verbal cues for improved technique with pelvic floor contractions and breathing coordination. Pt did have visualized anterior vaginal wall laxity with cough in hooklying. Pt would benefit from additional PT to further address deficits.     OBJECTIVE IMPAIRMENTS: decreased activity tolerance, decreased coordination, decreased endurance, decreased strength, increased fascial restrictions, increased muscle spasms, impaired flexibility, improper body mechanics, and postural dysfunction.   ACTIVITY LIMITATIONS: lifting and locomotion level  PARTICIPATION LIMITATIONS: community activity  PERSONAL FACTORS: 1 comorbidity: medical history   are also affecting patient's functional outcome.   REHAB POTENTIAL: Good  CLINICAL DECISION MAKING: Stable/uncomplicated  EVALUATION COMPLEXITY: Low   GOALS: Goals reviewed with patient? Yes  SHORT TERM GOALS: Target date: 05/06/23  Pt to be I with HEP.  Baseline: Goal status: INITIAL  2.  Pt to be I with lifting mechanics for 10# and pressure management to decreased strain at pelvic floor .  Baseline:  Goal status: INITIAL  3.  Pt to be  I with pressure management positions for decreased strain at prolapse.   Baseline:  Goal status: INITIAL   LONG TERM GOALS: Target date: 10/08/23  Pt to be I with advanced HEP.  Baseline:  Goal status: INITIAL  2.  Pt to demonstrate improved coordination of pelvic floor and breathing mechanics with 20# squat with appropriate synergistic patterns to decrease pain and leakage at least 75% of the time.    Baseline:  Goal status: INITIAL  3.  Pt to  demonstrate at least 5/5 bil hip strength for improved pelvic stability and functional squats without straining at prolapse.  Baseline:  Goal status: INITIAL  4.  Pt to report ability to complete 30 mins of workout without prolapse symptoms due to improved coordination and pelvic floor strengthening.  Baseline:  Goal status: INITIAL   PLAN:  PT FREQUENCY: 1-2x/week  PT DURATION:  10 sessions  PLANNED INTERVENTIONS: 97110-Therapeutic exercises, 97530- Therapeutic activity, 97112- Neuromuscular re-education, 97535- Self Care, 40981- Manual therapy, Patient/Family education, Taping, Dry Needling, Joint mobilization, Spinal mobilization, Scar mobilization, DME instructions, Cryotherapy, Moist heat, and Biofeedback  PLAN FOR NEXT SESSION: coordination of pelvic floor and breathing, strengthening hips and core.   Otelia Sergeant, PT, DPT 04/07/259:45 PM

## 2023-04-08 NOTE — Patient Instructions (Signed)
  15-20 mins in the evenings based on tolerance.  Please clear any positions with doctor as needed.  Please stop any position if negative symptoms occur (dizziness/lightheadedness/fatigue/increased pain, etc.)  

## 2023-04-15 ENCOUNTER — Other Ambulatory Visit: Payer: Commercial Managed Care - HMO | Admitting: Psychology

## 2023-04-15 ENCOUNTER — Ambulatory Visit

## 2023-04-15 DIAGNOSIS — M6281 Muscle weakness (generalized): Secondary | ICD-10-CM

## 2023-04-15 DIAGNOSIS — R293 Abnormal posture: Secondary | ICD-10-CM

## 2023-04-15 DIAGNOSIS — R279 Unspecified lack of coordination: Secondary | ICD-10-CM

## 2023-04-15 NOTE — Therapy (Signed)
 OUTPATIENT PHYSICAL THERAPY FEMALE PELVIC EVALUATION   [name] MRN: [id number] DOB:[birth year], 38 y.o., female Today's Date: 04/15/2023  END OF SESSION:  PT End of Session - 04/15/23 1017     Visit Number 2    Date for PT Re-Evaluation 10/08/23    Authorization Type CIgna    PT Start Time 1015    PT Stop Time 1055    PT Time Calculation (min) 40 min    Activity Tolerance Patient tolerated treatment well    Behavior During Therapy WFL for tasks assessed/performed             Past Medical History:  Diagnosis Date   ADD (attention deficit disorder)    Allergy    no meds   Anemia    as adolescent   Anxiety    Depression    GERD (gastroesophageal reflux disease)    doesn't take any meds for this, diet controlled   Headache(784.0)    Heart murmur    as a child - no problems   History of bronchitis    as a teenager, no problems as adult   History of migraine    can't remember when the last one was   Insomnia    no meds   Panic attacks    Symptomatic cholelithiasis 09/25/2011   UTI (urinary tract infection)    Past Surgical History:  Procedure Laterality Date   CHOLECYSTECTOMY  10/22/2011   Procedure: LAPAROSCOPIC CHOLECYSTECTOMY;  Surgeon: Rogena Class, MD;  Location: [facility];  Service: General;  Laterality: N/A;   DILATION AND EVACUATION N/A 12/07/2019   Procedure: DILATATION AND EVACUATION;  Surgeon: Verlyn Goad, MD;  Location: [facility];  Service: Gynecology;  Laterality: N/A;  NEEDS ULTRASOUND GUIDANCE ANORA KIT SENT    HYSTEROSCOPY WITH RESECTOSCOPE N/A 04/28/2013   Procedure:  HYSTEROSCOPY ;  Surgeon: Percy Bracken, MD;  Location: [facility];  Service: Gynecology;  Laterality: N/A;   LAPAROSCOPY N/A 04/28/2013   Procedure: LAPAROSCOPY DIAGNOSTIC;  Surgeon: Percy Bracken, MD;  Location: [facility];  Service: Gynecology;  Laterality: N/A;  2 hrs. total   OPERATIVE ULTRASOUND N/A 12/07/2019   Procedure: OPERATIVE  ULTRASOUND;  Surgeon: Verlyn Goad, MD;  Location: [facility];  Service: Gynecology;  Laterality: N/A;   WISDOM TOOTH EXTRACTION  65yrs ago   Patient Active Problem List   Diagnosis Date Noted   Hypokalemia 04/06/2021   Ehlers-Danlos syndrome 02/06/2021   Missed abortion    Female infertility 08/17/2019    PCP: none per chart  REFERRING PROVIDER: Constant, Peggy, MD   REFERRING DIAG: N81.11 (ICD-10-CM) - Midline cystocele  THERAPY DIAG:  Muscle weakness (generalized)  Abnormal posture  Unspecified lack of coordination  Rationale for Evaluation and Treatment: Rehabilitation  ONSET DATE: 1 week ago symptoms started  SUBJECTIVE:  SUBJECTIVE STATEMENT: Pt states that she feels like her symptoms are improving. She doesn't feel the fullness and need to lie down with inverted hips recently. She started out doing pelvic floor muscle contractions several times a day but has backed off to just 1-2x a day.   PAIN:  Are you having pain? No   PRECAUTIONS: None  RED FLAGS: None   WEIGHT BEARING RESTRICTIONS: No  FALLS:  Has patient fallen in last 6 months? 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  ACTIVITY LEVEL : moderate (work and toddler, is trying to do a little more piliates or yoga)   PLOF: Independent  PATIENT GOALS: to have no pressure and stronger pelvic floor   PERTINENT HISTORY:  Ehlers-Danlos syndrome, depression, anxiety Sexual abuse: No  BOWEL MOVEMENT: Pain with bowel movement: No Type of bowel movement:Type (Bristol Stool Scale) 2-3, Frequency every couple of days, and Strain not often  Fully empty rectum: Yes:   Leakage: No Pads: No Fiber supplement/laxative No  URINATION: Pain with urination: No Fully empty bladder: Yes:   Stream:  Strong Urgency: No Frequency: not quicker than every 2 hours, not nightly  Leakage: none Pads: No  INTERCOURSE:  Ability to have vaginal penetration Yes  Pain with intercourse: none Dryness - sometimes Climax: not fully, usually externally  Marinoff Scale: 0/3 Does use lubricant   PREGNANCY: Vaginal deliveries 01 Tearing No Episiotomy No C-section deliveries 0 Currently pregnant No  PROLAPSE: Pressure and Bulge - not since Friday when started period    OBJECTIVE:  Note: Objective measures were completed at Evaluation unless otherwise noted.  DIAGNOSTIC FINDINGS:   COGNITION: Overall cognitive status: Within functional limits for tasks assessed     SENSATION: Light touch: Appears intact   FUNCTIONAL TESTS:  Functional squat -   GAIT: WFL  POSTURE: rounded shoulders, forward head, and anterior pelvic tilt   LUMBARAROM/PROM:  A/PROM A/PROM  eval  Flexion WFL  Extension WFL  Right lateral flexion WFL  Left lateral flexion WFL  Right rotation WFL  Left rotation WFL   (Blank rows = not tested)  LOWER EXTREMITY ROM:  WFL  LOWER EXTREMITY MMT:  Bil hips grossly 4/5; knees 5/5 PALPATION:    Pelvic Alignment: WFL  Abdominal: fascial restrictions throughout lower abdominal quadrants                External Perineal Exam: [facility]                             Internal Pelvic Floor: no TTP  Patient confirms identification and approves PT to assess internal pelvic floor and treatment Yes No emotional/communication barriers or cognitive limitation. Patient is motivated to learn. Patient understands and agrees with treatment goals and plan. PT explains patient will be examined in standing, sitting, and lying down to see how their muscles and joints work. When they are ready, they will be asked to remove their underwear so PT can examine their perineum. The patient is also given the option of providing their own chaperone as one is not provided in our facility.  The patient also has the right and is explained the right to defer or refuse any part of the evaluation or treatment including the internal exam. With the patient's consent, PT will use one gloved finger to gently assess the muscles of the pelvic floor, seeing how well it contracts and relaxes and if there is muscle symmetry. After, the  patient will get dressed and PT and patient will discuss exam findings and plan of care. PT and patient discuss plan of care, schedule, attendance policy and HEP activities.  PELVIC MMT:   MMT eval  Vaginal 3/5; 5s; 4 reps  Internal Anal Sphincter   External Anal Sphincter   Puborectalis   Diastasis Recti   (Blank rows = not tested)        TONE: WFL  PROLAPSE: Mild possible grade 1 laxity noted with second cough in hooklying   TODAY'S TREATMENT:                                                                                                                              DATE:   04/15/23: Neuromuscular re-education: Transversus abdominus training with multimodal cues for improved motor control and breath coordination Bil supine UE ball press with transversus abdominus and pelvic floor muscle contractions and breath coordination 10x Supine hip adduction ball press with transversus abdominus and pelvic floor muscle contractions and breath coordination 10x Bridge with hip adduction, transversus abdominus, and pelvic floor muscle 2 x 10 Bridge with hip abduction using red band, transversus abdominus, and pelvic floor muscle 2 x 10 Supine resisted diagonal hip flexion 10x bil Seated hip adduction ball press with transversus abdominus and pelvic floor muscle 2 x 10 Seated hip abduction red band with transversus abdominus and pelvic floor muscle 2 x 10 Seated resisted march red band with transversus abdominus and pelvic floor muscle 2 x 10 Exercises: Seated thoracic extension 10x Open books 10x bil Unilateral chest press in side lying spinal twist 10x  bil   EVAL Examination completed, findings reviewed, pt educated on POC, HEP, and prolapse relief positions. Pt motivated to participate in PT and agreeable to attempt recommendations.      PATIENT EDUCATION:  Education details: [id number] Person educated: Patient Education method: Explanation, Demonstration, Tactile cues, Verbal cues, and Handouts Education comprehension: verbalized understanding, returned demonstration, verbal cues required, tactile cues required, and needs further education  HOME EXERCISE PROGRAM: [id number]  ASSESSMENT:  CLINICAL IMPRESSION: Pt has seen initial improvement in vaginal pressure. We focused on core training today and getting good pelvic floor muscle contraction with transversus abdominus all with exhale. She demonstrated good coordination, but when we added in LE to provide increased challenge she had more difficulty. She was able to improve her proprioception as she moved through various activities today. Several anterior upper body openers performed to help with anterior mobility as she feels tight in these areas and this could be decreasing ability to achieve appropriate core activation. She will continue to benefit from skilled PT intervention in order to address deficits.    OBJECTIVE IMPAIRMENTS: decreased activity tolerance, decreased coordination, decreased endurance, decreased strength, increased fascial restrictions, increased muscle spasms, impaired flexibility, improper body mechanics, and postural dysfunction.   ACTIVITY LIMITATIONS: lifting and locomotion level  PARTICIPATION LIMITATIONS: community activity  PERSONAL FACTORS: 1 comorbidity: medical history   are  also affecting patient's functional outcome.   REHAB POTENTIAL: Good  CLINICAL DECISION MAKING: Stable/uncomplicated  EVALUATION COMPLEXITY: Low   GOALS: Goals reviewed with patient? Yes  SHORT TERM GOALS: Target date: 05/06/23  Pt to be I with HEP.  Baseline: Goal status:  INITIAL  2.  Pt to be I with lifting mechanics for 10# and pressure management to decreased strain at pelvic floor .  Baseline:  Goal status: INITIAL  3.  Pt to be I with pressure management positions for decreased strain at prolapse.   Baseline:  Goal status: INITIAL   LONG TERM GOALS: Target date: 10/08/23  Pt to be I with advanced HEP.  Baseline:  Goal status: INITIAL  2.  Pt to demonstrate improved coordination of pelvic floor and breathing mechanics with 20# squat with appropriate synergistic patterns to decrease pain and leakage at least 75% of the time.    Baseline:  Goal status: INITIAL  3.  Pt to demonstrate at least 5/5 bil hip strength for improved pelvic stability and functional squats without straining at prolapse.  Baseline:  Goal status: INITIAL  4.  Pt to report ability to complete 30 mins of workout without prolapse symptoms due to improved coordination and pelvic floor strengthening.  Baseline:  Goal status: INITIAL   PLAN:  PT FREQUENCY: 1-2x/week  PT DURATION:  10 sessions  PLANNED INTERVENTIONS: 97110-Therapeutic exercises, 97530- Therapeutic activity, 97112- Neuromuscular re-education, 97535- Self Care, 16109- Manual therapy, Patient/Family education, Taping, Dry Needling, Joint mobilization, Spinal mobilization, Scar mobilization, DME instructions, Cryotherapy, Moist heat, and Biofeedback  PLAN FOR NEXT SESSION: coordination of pelvic floor and breathing, strengthening hips and core.   Verlena Glenn, PT, DPT04/14/2510:56 AM

## 2023-05-06 ENCOUNTER — Ambulatory Visit: Payer: Commercial Managed Care - HMO | Admitting: Psychology

## 2023-05-13 ENCOUNTER — Ambulatory Visit: Payer: Commercial Managed Care - HMO | Admitting: Clinical

## 2023-05-20 ENCOUNTER — Other Ambulatory Visit: Payer: Self-pay

## 2023-05-20 ENCOUNTER — Ambulatory Visit
Admission: EM | Admit: 2023-05-20 | Discharge: 2023-05-20 | Disposition: A | Discharge status: Home / Self Care | Attending: Family Medicine | Admitting: Family Medicine

## 2023-05-20 DIAGNOSIS — R051 Acute cough: Secondary | ICD-10-CM

## 2023-05-20 DIAGNOSIS — J019 Acute sinusitis, unspecified: Secondary | ICD-10-CM | POA: Diagnosis not present

## 2023-05-20 MED ORDER — BREAST PUMP MISC: 1.0000 | Freq: Every day | 0 refills | Status: DC

## 2023-05-20 MED ORDER — FLUTICASONE PROPIONATE 50 MCG/ACT NA SUSP: 2.0000 | Freq: Every day | NASAL | 0 refills | Status: AC

## 2023-05-20 MED ORDER — AMOXICILLIN-POT CLAVULANATE 875-125 MG PO TABS: 1.0000 | ORAL_TABLET | Freq: Two times a day (BID) | ORAL | 0 refills | Status: AC

## 2023-05-20 NOTE — ED Provider Notes (Signed)
 [facility]    CSN: [id number] Arrival date & time: 05/20/23  1125      History   Chief Complaint Chief Complaint  Patient presents with   Cough    HPI Krista Lee is a 38 y.o. female.   Patient states she has had a cough and respiratory symptoms and postnasal drip for well over a week.  She states that the mucus is thick and green.  She is starting to get increased face pressure and pain and headache.  Think she might have a sinus infection    Past Medical History:  Diagnosis Date   ADD (attention deficit disorder)    Allergy    no meds   Anemia    as adolescent   Anxiety    Depression    GERD (gastroesophageal reflux disease)    doesn't take any meds for this, diet controlled   Headache(784.0)    Heart murmur    as a child - no problems   History of bronchitis    as a teenager, no problems as adult   History of migraine    can't remember when the last one was   Insomnia    no meds   Panic attacks    Symptomatic cholelithiasis 09/25/2011   UTI (urinary tract infection)     Patient Active Problem List   Diagnosis Date Noted   Hypokalemia 04/06/2021   Ehlers-Danlos syndrome 02/06/2021   Missed abortion    Female infertility 08/17/2019    Past Surgical History:  Procedure Laterality Date   CHOLECYSTECTOMY  10/22/2011   Procedure: LAPAROSCOPIC CHOLECYSTECTOMY;  Surgeon: Krista Class, MD;  Location: [facility];  Service: General;  Laterality: N/A;   DILATION AND EVACUATION N/A 12/07/2019   Procedure: DILATATION AND EVACUATION;  Surgeon: Krista Goad, MD;  Location: [facility];  Service: Gynecology;  Laterality: N/A;  NEEDS ULTRASOUND GUIDANCE ANORA KIT SENT    HYSTEROSCOPY WITH RESECTOSCOPE N/A 04/28/2013   Procedure:  HYSTEROSCOPY ;  Surgeon: Krista Bracken, MD;  Location: [facility];  Service: Gynecology;  Laterality: N/A;   LAPAROSCOPY N/A 04/28/2013   Procedure: LAPAROSCOPY DIAGNOSTIC;  Surgeon: Krista Bracken, MD;   Location: [facility];  Service: Gynecology;  Laterality: N/A;  2 hrs. total   OPERATIVE ULTRASOUND N/A 12/07/2019   Procedure: OPERATIVE ULTRASOUND;  Surgeon: Krista Goad, MD;  Location: [facility];  Service: Gynecology;  Laterality: N/A;   WISDOM TOOTH EXTRACTION  75yrs ago    OB History     Gravida  2   Para  1   Term  1   Preterm  0   AB  1   Living  1      SAB  1   IAB  0   Ectopic  0   Multiple  0   Live Births  1            Home Medications    Prior to Admission medications   Medication Sig Start Date End Date Taking? Authorizing Provider  amoxicillin -clavulanate (AUGMENTIN ) 875-125 MG tablet Take 1 tablet by mouth every 12 (twelve) hours. 05/20/23  Yes Krista Ehrich, MD  cholecalciferol (VITAMIN D3) 25 MCG (1000 UNIT) tablet Take 1,000 Units by mouth daily.   Yes [provider]  fluticasone  (FLONASE ) 50 MCG/ACT nasal spray Place 2 sprays into both nostrils daily. 05/20/23  Yes Krista Ehrich, MD  Multiple Vitamin (MULTIVITAMIN WITH MINERALS) TABS tablet Take 1 tablet by mouth daily.  Yes [provider]    Family History Family History  Problem Relation Age of Onset   Hypertension Mother    Diverticulitis Mother    Hyperlipidemia Mother    Arthritis Father    Rheum arthritis Paternal Grandmother     Social History Social History   Tobacco Use   Smoking status: Former    Current packs/day: 0.00    Average packs/day: 0.5 packs/day for 8.0 years (4.0 ttl pk-yrs)    Types: Cigarettes    Start date: 08/2011    Quit date: 08/2019    Years since quitting: 3.8   Smokeless tobacco: Former    Quit date: 01/02/2011  Vaping Use   Vaping status: Never Used  Substance Use Topics   Alcohol use: Yes    Alcohol/week: 1.0 standard drink of alcohol    Types: 1 Glasses of wine per week   Drug use: No     Allergies   Prozac [fluoxetine hcl], Citrus, Ibuprofen, and Percocet [oxycodone -acetaminophen ]   Review  of Systems Review of Systems See HPI  Physical Exam Triage Vital Signs ED Triage Vitals  Encounter Vitals Group     BP 05/20/23 1133 106/72     Systolic BP Percentile --      Diastolic BP Percentile --      Pulse Rate 05/20/23 1133 73     Resp 05/20/23 1133 16     Temp 05/20/23 1133 98.6 F (37 C)     Temp src --      SpO2 05/20/23 1133 98 %     Weight --      Height --      Head Circumference --      Peak Flow --      Pain Score 05/20/23 1135 6     Pain Loc --      Pain Education --      Exclude from Growth Chart --    No data found.  Updated Vital Signs BP 106/72   Pulse 73   Temp 98.6 F (37 C)   Resp 16   LMP 05/02/2023 (Exact Date)   SpO2 98%       Physical Exam Constitutional:      General: She is not in acute distress.    Appearance: She is well-developed and normal weight.  HENT:     Head: Normocephalic and atraumatic.     Right Ear: Tympanic membrane normal.     Left Ear: Tympanic membrane normal.     Nose: Congestion and rhinorrhea present.     Mouth/Throat:     Mouth: Mucous membranes are dry.     Pharynx: Posterior oropharyngeal erythema present.     Comments: No tenderness to facial sinuses Eyes:     Conjunctiva/sclera: Conjunctivae normal.     Pupils: Pupils are equal, round, and reactive to light.  Cardiovascular:     Rate and Rhythm: Normal rate and regular rhythm.     Heart sounds: Normal heart sounds.  Pulmonary:     Effort: Pulmonary effort is normal. No respiratory distress.     Breath sounds: Normal breath sounds.  Abdominal:     General: There is no distension.     Palpations: Abdomen is soft.  Musculoskeletal:        General: Normal range of motion.     Cervical back: Normal range of motion.  Lymphadenopathy:     Cervical: Cervical adenopathy present.  Skin:    General: Skin is warm and dry.  Neurological:     Mental Status: She is alert.      UC Treatments / Results  Labs (all labs ordered are listed, but only  abnormal results are displayed) Labs Reviewed - No data to display  EKG   Radiology No results found.  Procedures Procedures (including critical care time)  Medications Ordered in UC Medications - No data to display  Initial Impression / Assessment and Plan / UC Course  I have reviewed the triage vital signs and the nursing notes.  Pertinent labs & imaging results that were available during my care of the patient were reviewed by me and considered in my medical decision making (see chart for details).     Final Clinical Impressions(s) / UC Diagnoses   Final diagnoses:  Acute sinusitis with symptoms > 10 days  Acute cough   Discharge Instructions      Make sure you are drinking lots of fluids You do not need to pick up the albuterol that was prescribed.  The Tessalon  medication will help with cough(benzonatate ) Take the antibiotic 2 times a day Use Flonase  daily until the symptoms are gone   ED Prescriptions     Medication Sig Dispense Auth. Provider   amoxicillin -clavulanate (AUGMENTIN ) 875-125 MG tablet Take 1 tablet by mouth every 12 (twelve) hours. 14 tablet Krista Ehrich, MD   fluticasone  (FLONASE ) 50 MCG/ACT nasal spray Place 2 sprays into both nostrils daily. 16 g Krista Ehrich, MD      PDMP not reviewed this encounter.   Krista Ehrich, MD 05/20/23 1236

## 2023-05-20 NOTE — Discharge Instructions (Addendum)
 Make sure you are drinking lots of fluids You do not need to pick up the albuterol that was prescribed.  The Tessalon  medication will help with cough(benzonatate ) Take the antibiotic 2 times a day Use Flonase  daily until the symptoms are gone

## 2023-05-20 NOTE — ED Triage Notes (Signed)
 Has c/o cold x 1 week. Cough has developed. Report cough is productive in the morning. Also thinks she has a sinus infection, has had nasal congestion and pressure in face. Has been taking sudafed. No fever.

## 2023-06-03 ENCOUNTER — Ambulatory Visit: Attending: Obstetrics and Gynecology

## 2023-06-03 DIAGNOSIS — M6281 Muscle weakness (generalized): Secondary | ICD-10-CM | POA: Diagnosis present

## 2023-06-03 DIAGNOSIS — R279 Unspecified lack of coordination: Secondary | ICD-10-CM | POA: Insufficient documentation

## 2023-06-03 DIAGNOSIS — R293 Abnormal posture: Secondary | ICD-10-CM | POA: Insufficient documentation

## 2023-06-03 NOTE — Therapy (Signed)
 OUTPATIENT PHYSICAL THERAPY FEMALE PELVIC TREATMENT   [name] MRN: [id number] DOB:06-18-1985, 38 y.o., female Today's Date: 06/03/2023  END OF SESSION:  PT End of Session - 06/03/23 1448     Visit Number 3    Date for PT Re-Evaluation 10/08/23    Authorization Type CIgna    PT Start Time 1445    PT Stop Time 1525    PT Time Calculation (min) 40 min    Activity Tolerance Patient tolerated treatment well    Behavior During Therapy WFL for tasks assessed/performed              Past Medical History:  Diagnosis Date   ADD (attention deficit disorder)    Allergy    no meds   Anemia    as adolescent   Anxiety    Depression    GERD (gastroesophageal reflux disease)    doesn't take any meds for this, diet controlled   Headache(784.0)    Heart murmur    as a child - no problems   History of bronchitis    as a teenager, no problems as adult   History of migraine    can't remember when the last one was   Insomnia    no meds   Panic attacks    Symptomatic cholelithiasis 09/25/2011   UTI (urinary tract infection)    Past Surgical History:  Procedure Laterality Date   CHOLECYSTECTOMY  10/22/2011   Procedure: LAPAROSCOPIC CHOLECYSTECTOMY;  Surgeon: Rogena Class, MD;  Location: [facility];  Service: General;  Laterality: N/A;   DILATION AND EVACUATION N/A 12/07/2019   Procedure: DILATATION AND EVACUATION;  Surgeon: Verlyn Goad, MD;  Location: [facility];  Service: Gynecology;  Laterality: N/A;  NEEDS ULTRASOUND GUIDANCE ANORA KIT SENT    HYSTEROSCOPY WITH RESECTOSCOPE N/A 04/28/2013   Procedure:  HYSTEROSCOPY ;  Surgeon: Percy Bracken, MD;  Location: [facility];  Service: Gynecology;  Laterality: N/A;   LAPAROSCOPY N/A 04/28/2013   Procedure: LAPAROSCOPY DIAGNOSTIC;  Surgeon: Percy Bracken, MD;  Location: [facility];  Service: Gynecology;  Laterality: N/A;  2 hrs. total   OPERATIVE ULTRASOUND N/A 12/07/2019   Procedure: OPERATIVE  ULTRASOUND;  Surgeon: Verlyn Goad, MD;  Location: [facility];  Service: Gynecology;  Laterality: N/A;   WISDOM TOOTH EXTRACTION  79yrs ago   Patient Active Problem List   Diagnosis Date Noted   Hypokalemia 04/06/2021   Ehlers-Danlos syndrome 02/06/2021   Missed abortion    Female infertility 08/17/2019    PCP: none per chart  REFERRING PROVIDER: Constant, Peggy, MD   REFERRING DIAG: N81.11 (ICD-10-CM) - Midline cystocele  THERAPY DIAG:  Muscle weakness (generalized)  Abnormal posture  Unspecified lack of coordination  Rationale for Evaluation and Treatment: Rehabilitation  ONSET DATE: 1 week ago symptoms started  SUBJECTIVE:  SUBJECTIVE STATEMENT: Pt states that she has been working on exercises, but she got sick and performed less often during that time. She also feel like cough exacerbated symptoms. She has trouble coordinating ball squeeze, pelvic floor muscle contraction, and feet out all at the same time. She states that she does not feel fullness anymore inside the vagina, but she can feel something touching.   PAIN:  Are you having pain? No   PRECAUTIONS: None  RED FLAGS: None   WEIGHT BEARING RESTRICTIONS: No  FALLS:  Has patient fallen in last 6 months? 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  ACTIVITY LEVEL : moderate (work and toddler, is trying to do a little more piliates or yoga)   PLOF: Independent  PATIENT GOALS: to have no pressure and stronger pelvic floor   PERTINENT HISTORY:  Ehlers-Danlos syndrome, depression, anxiety Sexual abuse: No  BOWEL MOVEMENT: Pain with bowel movement: No Type of bowel movement:Type (Bristol Stool Scale) 2-3, Frequency every couple of days, and Strain not often  Fully empty rectum: Yes:   Leakage:  No Pads: No Fiber supplement/laxative No  URINATION: Pain with urination: No Fully empty bladder: Yes:   Stream: Strong Urgency: No Frequency: not quicker than every 2 hours, not nightly  Leakage: none Pads: No  INTERCOURSE:  Ability to have vaginal penetration Yes  Pain with intercourse: none Dryness - sometimes Climax: not fully, usually externally  Marinoff Scale: 0/3 Does use lubricant   PREGNANCY: Vaginal deliveries 01 Tearing No Episiotomy No C-section deliveries 0 Currently pregnant No  PROLAPSE: Pressure and Bulge - not since Friday when started period    OBJECTIVE:  Note: Objective measures were completed at Evaluation unless otherwise noted.  DIAGNOSTIC FINDINGS:   COGNITION: Overall cognitive status: Within functional limits for tasks assessed     SENSATION: Light touch: Appears intact   FUNCTIONAL TESTS:  Functional squat -   GAIT: WFL  POSTURE: rounded shoulders, forward head, and anterior pelvic tilt   LUMBARAROM/PROM:  A/PROM A/PROM  eval  Flexion WFL  Extension WFL  Right lateral flexion WFL  Left lateral flexion WFL  Right rotation WFL  Left rotation WFL   (Blank rows = not tested)  LOWER EXTREMITY ROM:  WFL  LOWER EXTREMITY MMT:  Bil hips grossly 4/5; knees 5/5 PALPATION:    Pelvic Alignment: WFL  Abdominal: fascial restrictions throughout lower abdominal quadrants                External Perineal Exam: [facility]                             Internal Pelvic Floor: no TTP  Patient confirms identification and approves PT to assess internal pelvic floor and treatment Yes No emotional/communication barriers or cognitive limitation. Patient is motivated to learn. Patient understands and agrees with treatment goals and plan. PT explains patient will be examined in standing, sitting, and lying down to see how their muscles and joints work. When they are ready, they will be asked to remove their underwear so PT can examine their  perineum. The patient is also given the option of providing their own chaperone as one is not provided in our facility. The patient also has the right and is explained the right to defer or refuse any part of the evaluation or treatment including the internal exam. With the patient's consent, PT will use one gloved finger to gently assess the muscles of  the pelvic floor, seeing how well it contracts and relaxes and if there is muscle symmetry. After, the patient will get dressed and PT and patient will discuss exam findings and plan of care. PT and patient discuss plan of care, schedule, attendance policy and HEP activities.  PELVIC MMT:   MMT eval  Vaginal 3/5; 5s; 4 reps  Internal Anal Sphincter   External Anal Sphincter   Puborectalis   Diastasis Recti   (Blank rows = not tested)        TONE: WFL  PROLAPSE: Mild possible grade 1 laxity noted with second cough in hooklying   TODAY'S TREATMENT:                                                                                                                              DATE:   06/03/23 Neuromuscular re-education: Bird-dog wiyth multimodal cues for improved form 2 x 10 Bear plank 10x with multimodal cues for improved core activation  Reverse table top toe taps 10x Reverse table top fire hydrants 10x Supine weight drop 6 lbs 2 x 10 Supine head lifts 10x Seated march with anterior weight hold 6 lbs 2 x 10 Exercises: Lower trunk rotation 2 x 10 Therapeutic activities: Pallof press 10x bil green band Shoulder extensions green band 2 x 10    04/15/23: Neuromuscular re-education: Transversus abdominus training with multimodal cues for improved motor control and breath coordination Bil supine UE ball press with transversus abdominus and pelvic floor muscle contractions and breath coordination 10x Supine hip adduction ball press with transversus abdominus and pelvic floor muscle contractions and breath coordination 10x Bridge with hip  adduction, transversus abdominus, and pelvic floor muscle 2 x 10 Bridge with hip abduction using red band, transversus abdominus, and pelvic floor muscle 2 x 10 Supine resisted diagonal hip flexion 10x bil Seated hip adduction ball press with transversus abdominus and pelvic floor muscle 2 x 10 Seated hip abduction red band with transversus abdominus and pelvic floor muscle 2 x 10 Seated resisted march red band with transversus abdominus and pelvic floor muscle 2 x 10 Exercises: Seated thoracic extension 10x Open books 10x bil Unilateral chest press in side lying spinal twist 10x bil   EVAL Examination completed, findings reviewed, pt educated on POC, HEP, and prolapse relief positions. Pt motivated to participate in PT and agreeable to attempt recommendations.      PATIENT EDUCATION:  Education details: [id number] Person educated: Patient Education method: Explanation, Demonstration, Tactile cues, Verbal cues, and Handouts Education comprehension: verbalized understanding, returned demonstration, verbal cues required, tactile cues required, and needs further education  HOME EXERCISE PROGRAM: [id number]  ASSESSMENT:  CLINICAL IMPRESSION: Pt doing well with good reduction in vaginal pressure. She is still having some difficulty with coordination with deep core and breathing. In table exercises today she had the most difficulty with activating upper transversus abdominus and holding rib cage down and preventing it from flaring, leading to decreased stability. She  did very well with multimodal cues to help improve form. HEP updated. She will continue to benefit from skilled PT intervention in order to address deficits.    OBJECTIVE IMPAIRMENTS: decreased activity tolerance, decreased coordination, decreased endurance, decreased strength, increased fascial restrictions, increased muscle spasms, impaired flexibility, improper body mechanics, and postural dysfunction.   ACTIVITY LIMITATIONS:  lifting and locomotion level  PARTICIPATION LIMITATIONS: community activity  PERSONAL FACTORS: 1 comorbidity: medical history  are also affecting patient's functional outcome.   REHAB POTENTIAL: Good  CLINICAL DECISION MAKING: Stable/uncomplicated  EVALUATION COMPLEXITY: Low   GOALS: Goals reviewed with patient? Yes  SHORT TERM GOALS: Target date: 05/06/23 - updated 06/03/23  Pt to be I with HEP.  Baseline: Goal status: MET 06/03/23  2.  Pt to be I with lifting mechanics for 10# and pressure management to decreased strain at pelvic floor .  Baseline:  Goal status: IN PROGRESS 06/03/23  3.  Pt to be I with pressure management positions for decreased strain at prolapse.   Baseline:  Goal status:  IN PROGRESS 06/03/23   LONG TERM GOALS: Target date: 10/08/23 - updated 06/03/23  Pt to be I with advanced HEP.  Baseline:  Goal status:  IN PROGRESS 06/03/23  2.  Pt to demonstrate improved coordination of pelvic floor and breathing mechanics with 20# squat with appropriate synergistic patterns to decrease pain and leakage at least 75% of the time.    Baseline:  Goal status:  IN PROGRESS 06/03/23  3.  Pt to demonstrate at least 5/5 bil hip strength for improved pelvic stability and functional squats without straining at prolapse.  Baseline:  Goal status:  IN PROGRESS 06/03/23  4.  Pt to report ability to complete 30 mins of workout without prolapse symptoms due to improved coordination and pelvic floor strengthening.  Baseline:  Goal status:  IN PROGRESS 06/03/23   PLAN:  PT FREQUENCY: 1-2x/week  PT DURATION: 10 sessions  PLANNED INTERVENTIONS: 97110-Therapeutic exercises, 97530- Therapeutic activity, 97112- Neuromuscular re-education, 97535- Self Care, 21308- Manual therapy, Patient/Family education, Taping, Dry Needling, Joint mobilization, Spinal mobilization, Scar mobilization, DME instructions, Cryotherapy, Moist heat, and Biofeedback  PLAN FOR NEXT SESSION: coordination of pelvic  floor and breathing, strengthening hips and core.   Verlena Glenn, PT, DPT06/02/253:28 PM

## 2023-06-24 ENCOUNTER — Ambulatory Visit

## 2023-06-24 DIAGNOSIS — M6281 Muscle weakness (generalized): Secondary | ICD-10-CM | POA: Diagnosis not present

## 2023-06-24 DIAGNOSIS — R279 Unspecified lack of coordination: Secondary | ICD-10-CM

## 2023-06-24 DIAGNOSIS — R293 Abnormal posture: Secondary | ICD-10-CM

## 2023-06-24 NOTE — Therapy (Signed)
 OUTPATIENT PHYSICAL THERAPY FEMALE PELVIC TREATMENT   [name] MRN: [id number] DOB:[birth year], 38 y.o., female Today's Date: 06/24/2023  END OF SESSION:  PT End of Session - 06/24/23 1618     Visit Number 4    Date for PT Re-Evaluation 10/08/23    Authorization Type [employer]    PT Start Time 1615    PT Stop Time 1655    PT Time Calculation (min) 40 min    Activity Tolerance Patient tolerated treatment well    Behavior During Therapy WFL for tasks assessed/performed            Past Medical History:  Diagnosis Date   ADD (attention deficit disorder)    Allergy    no meds   Anemia    as adolescent   Anxiety    Depression    GERD (gastroesophageal reflux disease)    doesn't take any meds for this, diet controlled   Headache(784.0)    Heart murmur    as a child - no problems   History of bronchitis    as a teenager, no problems as adult   History of migraine    can't remember when the last one was   Insomnia    no meds   Panic attacks    Symptomatic cholelithiasis 09/25/2011   UTI (urinary tract infection)    Past Surgical History:  Procedure Laterality Date   CHOLECYSTECTOMY  10/22/2011   Procedure: LAPAROSCOPIC CHOLECYSTECTOMY;  Surgeon: Vicenta DELENA Poli, MD;  Location: [facility];  Service: General;  Laterality: N/A;   DILATION AND EVACUATION N/A 12/07/2019   Procedure: DILATATION AND EVACUATION;  Surgeon: Alger Gong, MD;  Location: [facility];  Service: Gynecology;  Laterality: N/A;  NEEDS ULTRASOUND GUIDANCE ANORA KIT SENT    HYSTEROSCOPY WITH RESECTOSCOPE N/A 04/28/2013   Procedure:  HYSTEROSCOPY ;  Surgeon: Percilla Burly, MD;  Location: [facility];  Service: Gynecology;  Laterality: N/A;   LAPAROSCOPY N/A 04/28/2013   Procedure: LAPAROSCOPY DIAGNOSTIC;  Surgeon: Percilla Burly, MD;  Location: [facility];  Service: Gynecology;  Laterality: N/A;  2 hrs. total   OPERATIVE ULTRASOUND N/A 12/07/2019   Procedure: OPERATIVE  ULTRASOUND;  Surgeon: Alger Gong, MD;  Location:  SUR[facility]ERY CENTER;  Service: Gynecology;  Laterality: N/A;   WISDOM TOOTH EXTRACTION  78yrs ago   Patient Active Problem List   Diagnosis Date Noted   Hypokalemia 04/06/2021   Ehlers-Danlos syndrome 02/06/2021   Missed abortion    Female infertility 08/17/2019    PCP: none per chart  REFERRING PROVIDER: Constant, Peggy, MD   REFERRING DIAG: N81.11 (ICD-10-CM) - Midline cystocele  THERAPY DIAG:  Muscle weakness (generalized)  Abnormal posture  Unspecified lack of coordination  Rationale for Evaluation and Treatment: Rehabilitation  ONSET DATE: 1 week ago symptoms started  SUBJECTIVE:  SUBJECTIVE STATEMENT: Pt states that she is unsure if she is doing bird dogs correctly due to low back pain after she does them.  PAIN:  Are you having pain? No   PRECAUTIONS: None  RED FLAGS: None   WEIGHT BEARING RESTRICTIONS: No  FALLS:  Has patient fallen in last 6 months? 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  ACTIVITY LEVEL : moderate (work and toddler, is trying to do a little more piliates or yoga)   PLOF: Independent  PATIENT GOALS: to have no pressure and stronger pelvic floor   PERTINENT HISTORY:  Ehlers-Danlos syndrome, depression, anxiety Sexual abuse: No  BOWEL MOVEMENT: Pain with bowel movement: No Type of bowel movement:Type (Bristol Stool Scale) 2-3, Frequency every couple of days, and Strain not often  Fully empty rectum: Yes:   Leakage: No Pads: No Fiber supplement/laxative No  URINATION: Pain with urination: No Fully empty bladder: Yes:   Stream: Strong Urgency: No Frequency: not quicker than every 2 hours, not nightly  Leakage: none Pads: No  INTERCOURSE:  Ability to have vaginal  penetration Yes  Pain with intercourse: none Dryness - sometimes Climax: not fully, usually externally  Marinoff Scale: 0/3 Does use lubricant   PREGNANCY: Vaginal deliveries 01 Tearing No Episiotomy No C-section deliveries 0 Currently pregnant No  PROLAPSE: Pressure and Bulge - not since Friday when started period    OBJECTIVE:  Note: Objective measures were completed at Evaluation unless otherwise noted.  DIAGNOSTIC FINDINGS:   COGNITION: Overall cognitive status: Within functional limits for tasks assessed     SENSATION: Light touch: Appears intact   FUNCTIONAL TESTS:  Functional squat -   GAIT: WFL  POSTURE: rounded shoulders, forward head, and anterior pelvic tilt   LUMBARAROM/PROM:  A/PROM A/PROM  eval  Flexion WFL  Extension WFL  Right lateral flexion WFL  Left lateral flexion WFL  Right rotation WFL  Left rotation WFL   (Blank rows = not tested)  LOWER EXTREMITY ROM:  WFL  LOWER EXTREMITY MMT:  Bil hips grossly 4/5; knees 5/5 PALPATION:    Pelvic Alignment: WFL  Abdominal: fascial restrictions throughout lower abdominal quadrants                External Perineal Exam: [facility]                             Internal Pelvic Floor: no TTP  Patient confirms identification and approves PT to assess internal pelvic floor and treatment Yes No emotional/communication barriers or cognitive limitation. Patient is motivated to learn. Patient understands and agrees with treatment goals and plan. PT explains patient will be examined in standing, sitting, and lying down to see how their muscles and joints work. When they are ready, they will be asked to remove their underwear so PT can examine their perineum. The patient is also given the option of providing their own chaperone as one is not provided in our facility. The patient also has the right and is explained the right to defer or refuse any part of the evaluation or treatment including the internal exam.  With the patient's consent, PT will use one gloved finger to gently assess the muscles of the pelvic floor, seeing how well it contracts and relaxes and if there is muscle symmetry. After, the patient will get dressed and PT and patient will discuss exam findings and plan of care. PT and patient discuss plan of care, schedule,  attendance policy and HEP activities.  PELVIC MMT:   MMT eval  Vaginal 3/5; 5s; 4 reps  Internal Anal Sphincter   External Anal Sphincter   Puborectalis   Diastasis Recti   (Blank rows = not tested)        TONE: WFL  PROLAPSE: Mild possible grade 1 laxity noted with second cough in hooklying   TODAY'S TREATMENT:                                                                                                                              DATE:   06/24/23 Neuromuscular re-education: Bird dog 5x Bird dog rows 10x bil 3 lbs  Modified dead bug with 2 lb weight 90/90 heel drops 10x - stopped due to using pressure increases to perform correctly Standing march 3 lb anterior weight hold with 5 second hold 10x Standing single leg hip hinge 10x bil with UE support Rows on single leg green band 10x bil Single UE extensions standing on single leg green band 10x bil Warrior 3 to half moon pelvic rotations 10x bil   06/03/23 Neuromuscular re-education: Bird-dog wiyth multimodal cues for improved form 2 x 10 Bear plank 10x with multimodal cues for improved core activation  Reverse table top toe taps 10x Reverse table top fire hydrants 10x Supine weight drop 6 lbs 2 x 10 Supine head lifts 10x Seated march with anterior weight hold 6 lbs 2 x 10 Exercises: Lower trunk rotation 2 x 10 Therapeutic activities: Pallof press 10x bil green band Shoulder extensions green band 2 x 10    04/15/23: Neuromuscular re-education: Transversus abdominus training with multimodal cues for improved motor control and breath coordination Bil supine UE ball press with transversus  abdominus and pelvic floor muscle contractions and breath coordination 10x Supine hip adduction ball press with transversus abdominus and pelvic floor muscle contractions and breath coordination 10x Bridge with hip adduction, transversus abdominus, and pelvic floor muscle 2 x 10 Bridge with hip abduction using red band, transversus abdominus, and pelvic floor muscle 2 x 10 Supine resisted diagonal hip flexion 10x bil Seated hip adduction ball press with transversus abdominus and pelvic floor muscle 2 x 10 Seated hip abduction red band with transversus abdominus and pelvic floor muscle 2 x 10 Seated resisted march red band with transversus abdominus and pelvic floor muscle 2 x 10 Exercises: Seated thoracic extension 10x Open books 10x bil Unilateral chest press in side lying spinal twist 10x bil   PATIENT EDUCATION:  Education details: [id number] Person educated: Patient Education method: Explanation, Demonstration, Tactile cues, Verbal cues, and Handouts Education comprehension: verbalized understanding, returned demonstration, verbal cues required, tactile cues required, and needs further education  HOME EXERCISE PROGRAM: [id number]  ASSESSMENT:  CLINICAL IMPRESSION: Pt is doing very well without feeling any prolapse symptoms. We did go over several of her exercises to make sure she is performing correctly. Eluterio dog was demonstrating too much thoracic rotation/loss of stability. We  modified to perform with static holds of leg and rows to help correct. 90/90 toe taps were too difficult and she was using increased abdominal pressure to perform; changed to use modified dead bug with weight instead. She was able to start balance activities today with good tolerance. Videos and notes taken on her phone to help remember. She will continue to benefit from skilled PT intervention in order to address deficits.    OBJECTIVE IMPAIRMENTS: decreased activity tolerance, decreased coordination, decreased  endurance, decreased strength, increased fascial restrictions, increased muscle spasms, impaired flexibility, improper body mechanics, and postural dysfunction.   ACTIVITY LIMITATIONS: lifting and locomotion level  PARTICIPATION LIMITATIONS: community activity  PERSONAL FACTORS: 1 comorbidity: medical history  are also affecting patient's functional outcome.   REHAB POTENTIAL: Good  CLINICAL DECISION MAKING: Stable/uncomplicated  EVALUATION COMPLEXITY: Low   GOALS: Goals reviewed with patient? Yes  SHORT TERM GOALS: Target date: 05/06/23 - updated 06/03/23  Pt to be I with HEP.  Baseline: Goal status: MET 06/03/23  2.  Pt to be I with lifting mechanics for 10# and pressure management to decreased strain at pelvic floor .  Baseline:  Goal status: IN PROGRESS 06/03/23  3.  Pt to be I with pressure management positions for decreased strain at prolapse.   Baseline:  Goal status:  IN PROGRESS 06/03/23   LONG TERM GOALS: Target date: 10/08/23 - updated 06/03/23  Pt to be I with advanced HEP.  Baseline:  Goal status:  IN PROGRESS 06/03/23  2.  Pt to demonstrate improved coordination of pelvic floor and breathing mechanics with 20# squat with appropriate synergistic patterns to decrease pain and leakage at least 75% of the time.    Baseline:  Goal status:  IN PROGRESS 06/03/23  3.  Pt to demonstrate at least 5/5 bil hip strength for improved pelvic stability and functional squats without straining at prolapse.  Baseline:  Goal status:  IN PROGRESS 06/03/23  4.  Pt to report ability to complete 30 mins of workout without prolapse symptoms due to improved coordination and pelvic floor strengthening.  Baseline:  Goal status:  IN PROGRESS 06/03/23   PLAN:  PT FREQUENCY: 1-2x/week  PT DURATION: 10 sessions  PLANNED INTERVENTIONS: 97110-Therapeutic exercises, 97530- Therapeutic activity, 97112- Neuromuscular re-education, 97535- Self Care, [id number]- Manual therapy, Patient/Family education,  Taping, Dry Needling, Joint mobilization, Spinal mobilization, Scar mobilization, DME instructions, Cryotherapy, Moist heat, and Biofeedback  PLAN FOR NEXT SESSION: coordination of pelvic floor and breathing, strengthening hips and core.   Josette Mares, PT, DPT06/23/255:07 PM

## 2023-07-01 ENCOUNTER — Ambulatory Visit (INDEPENDENT_AMBULATORY_CARE_PROVIDER_SITE_OTHER): Admitting: Clinical

## 2023-07-01 DIAGNOSIS — F411 Generalized anxiety disorder: Secondary | ICD-10-CM

## 2023-07-01 NOTE — Progress Notes (Signed)
 Diagnosis: F41.1 Time: 10:00am-10:56 am CPT Code: [id number]-04  Virga was seen remotely using secure video conferencing.She was in her home, and the therapist was in her office at the time of the appointment. Client is aware of risks of telehealth and consented to a virtual visit. Hadyn shared that she continues to do well overall. Session focused on aspects of her family of origin's communication style that she had noticed on a family beach trip. Therapist engaged her in discussion of camouflaging behavior, and suggested looking into the CAT-Q online. She expressed interest in discussing further. She is scheduled to be seen again in two months.  Treatment Plan Client Abilities/Strengths  Ashna presented as insightful and motivated, with clear goals for therapy.  Client Treatment Preferences  Yumi would like to begin with monthly appointments, and increase frequency once she has hit her insurance deductible.  Client Statement of Needs  Katherleen is seeking CBT to address anxiety and relationship challenges.  Treatment Level  Monthly  Symptoms  Anxiety : vivid, persistent thoughts of worst case scenarios that are difficult to disengage from (Status: maintained). Relational Challenges: Difficulty with mutual triggering of uncomfortable emotions in marriage (Status: maintained).  Problems Addressed  New Description, New Description  Goals 1. Difficulty in relationship with husband Objective Development of strategies to regulate emotional response to husband's stress Target Date: 2024-01-09 Frequency: Biweekly  Progress: 0 Modality: individual  Related Interventions Therapist will work with Doyal to develop communication strategies by helping her to consider how she might respond to different situations and offering opportunities to practice, such as through written exercises and role plays Therapist will work with Doyal to develop her communication strategies, including labeling and expressing her  emotions and using I statements 2. Persistent anxiety triggered by thoughts of worse case scenarios that feel real Objective Decrease in frequency and severity of anxiety, increase in overall well being Target Date: 2024-01-09 Frequency: Biweekly  Progress: 0 Modality: individual  Related Interventions Therapist will provide referrals for additional resources as appropriate Therapist will provide Edina with strategies to regulate her emotions, including meditation, breathing exercises, mindfulness, and self-care. Therapist will help Bryn to identify and disengage from maladaptive thought patterns using CBT-based strategies Therapist will provide Caroleann with opportunities to process her experiences in session Diagnosis Axis none 300.00 (Anxiety state, unspecified) - Open - [Signifier: n/a]    Conditions For Discharge Achievement of treatment goals and objectives     Andriette LITTIE Ponto, PhD               Andriette LITTIE Ponto, PhD          Andriette LITTIE Ponto, PhD

## 2023-07-22 ENCOUNTER — Ambulatory Visit: Attending: Obstetrics and Gynecology

## 2023-07-22 DIAGNOSIS — R293 Abnormal posture: Secondary | ICD-10-CM | POA: Insufficient documentation

## 2023-07-22 DIAGNOSIS — R279 Unspecified lack of coordination: Secondary | ICD-10-CM | POA: Insufficient documentation

## 2023-07-22 DIAGNOSIS — M6281 Muscle weakness (generalized): Secondary | ICD-10-CM | POA: Diagnosis present

## 2023-07-22 NOTE — Therapy (Signed)
 OUTPATIENT PHYSICAL THERAPY FEMALE PELVIC TREATMENT   [name] MRN: [id number] DOB:1985/09/12, 38 y.o., female Today's Date: 07/22/2023  END OF SESSION:  PT End of Session - 07/22/23 1530     Visit Number 5    Date for PT Re-Evaluation 10/08/23    Authorization Type [employer]    PT Start Time 1530    PT Stop Time 1610    PT Time Calculation (min) 40 min    Activity Tolerance Patient tolerated treatment well    Behavior During Therapy WFL for tasks assessed/performed            Past Medical History:  Diagnosis Date   ADD (attention deficit disorder)    Allergy    no meds   Anemia    as adolescent   Anxiety    Depression    GERD (gastroesophageal reflux disease)    doesn't take any meds for this, diet controlled   Headache(784.0)    Heart murmur    as a child - no problems   History of bronchitis    as a teenager, no problems as adult   History of migraine    can't remember when the last one was   Insomnia    no meds   Panic attacks    Symptomatic cholelithiasis 09/25/2011   UTI (urinary tract infection)    Past Surgical History:  Procedure Laterality Date   CHOLECYSTECTOMY  10/22/2011   Procedure: LAPAROSCOPIC CHOLECYSTECTOMY;  Surgeon: Vicenta DELENA Poli, MD;  Location: [facility];  Service: General;  Laterality: N/A;   DILATION AND EVACUATION N/A 12/07/2019   Procedure: DILATATION AND EVACUATION;  Surgeon: Alger Gong, MD;  Location: [facility];  Service: Gynecology;  Laterality: N/A;  NEEDS ULTRASOUND GUIDANCE ANORA KIT SENT    HYSTEROSCOPY WITH RESECTOSCOPE N/A 04/28/2013   Procedure:  HYSTEROSCOPY ;  Surgeon: Percilla Burly, MD;  Location: [facility];  Service: Gynecology;  Laterality: N/A;   LAPAROSCOPY N/A 04/28/2013   Procedure: LAPAROSCOPY DIAGNOSTIC;  Surgeon: Percilla Burly, MD;  Location: [facility];  Service: Gynecology;  Laterality: N/A;  2 hrs. total   OPERATIVE ULTRASOUND N/A 12/07/2019   Procedure: OPERATIVE  ULTRASOUND;  Surgeon: Alger Gong, MD;  Location: [facility];  Service: Gynecology;  Laterality: N/A;   WISDOM TOOTH EXTRACTION  6yrs ago   Patient Active Problem List   Diagnosis Date Noted   Hypokalemia 04/06/2021   Ehlers-Danlos syndrome 02/06/2021   Missed abortion    Female infertility 08/17/2019    PCP: none per chart  REFERRING PROVIDER: Constant, Peggy, MD   REFERRING DIAG: N81.11 (ICD-10-CM) - Midline cystocele  THERAPY DIAG:  Muscle weakness (generalized)  Abnormal posture  Unspecified lack of coordination  Rationale for Evaluation and Treatment: Rehabilitation  ONSET DATE: 1 week ago symptoms started  SUBJECTIVE:  SUBJECTIVE STATEMENT: Pt states that she has been having some pain in her low back today that has not gone away with stretches. Pt states that she in general is feeling much more stable in bird dogs.   PAIN:  Are you having pain? Yes 4/10 Aggravating: standing Easing: lying down   PRECAUTIONS: None  RED FLAGS: None   WEIGHT BEARING RESTRICTIONS: No  FALLS:  Has patient fallen in last 6 months? 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  ACTIVITY LEVEL : moderate (work and toddler, is trying to do a little more piliates or yoga)   PLOF: Independent  PATIENT GOALS: to have no pressure and stronger pelvic floor   PERTINENT HISTORY:  Ehlers-Danlos syndrome, depression, anxiety Sexual abuse: No  BOWEL MOVEMENT: Pain with bowel movement: No Type of bowel movement:Type (Bristol Stool Scale) 2-3, Frequency every couple of days, and Strain not often  Fully empty rectum: Yes:   Leakage: No Pads: No Fiber supplement/laxative No  URINATION: Pain with urination: No Fully empty bladder: Yes:   Stream: Strong Urgency: No Frequency:  not quicker than every 2 hours, not nightly  Leakage: none Pads: No  INTERCOURSE:  Ability to have vaginal penetration Yes  Pain with intercourse: none Dryness - sometimes Climax: not fully, usually externally  Marinoff Scale: 0/3 Does use lubricant   PREGNANCY: Vaginal deliveries 01 Tearing No Episiotomy No C-section deliveries 0 Currently pregnant No  PROLAPSE: Pressure and Bulge - not since Friday when started period    OBJECTIVE:  Note: Objective measures were completed at Evaluation unless otherwise noted.  DIAGNOSTIC FINDINGS:   COGNITION: Overall cognitive status: Within functional limits for tasks assessed     SENSATION: Light touch: Appears intact   FUNCTIONAL TESTS:  Functional squat -   GAIT: WFL  POSTURE: rounded shoulders, forward head, and anterior pelvic tilt   LUMBARAROM/PROM:  A/PROM A/PROM  eval  Flexion WFL  Extension WFL  Right lateral flexion WFL  Left lateral flexion WFL  Right rotation WFL  Left rotation WFL   (Blank rows = not tested)  LOWER EXTREMITY ROM:  WFL  LOWER EXTREMITY MMT:  Bil hips grossly 4/5; knees 5/5 PALPATION:    Pelvic Alignment: WFL  Abdominal: fascial restrictions throughout lower abdominal quadrants                External Perineal Exam: [facility]                             Internal Pelvic Floor: no TTP  Patient confirms identification and approves PT to assess internal pelvic floor and treatment Yes No emotional/communication barriers or cognitive limitation. Patient is motivated to learn. Patient understands and agrees with treatment goals and plan. PT explains patient will be examined in standing, sitting, and lying down to see how their muscles and joints work. When they are ready, they will be asked to remove their underwear so PT can examine their perineum. The patient is also given the option of providing their own chaperone as one is not provided in our facility. The patient also has the right  and is explained the right to defer or refuse any part of the evaluation or treatment including the internal exam. With the patient's consent, PT will use one gloved finger to gently assess the muscles of the pelvic floor, seeing how well it contracts and relaxes and if there is muscle symmetry. After, the patient will get dressed  and PT and patient will discuss exam findings and plan of care. PT and patient discuss plan of care, schedule, attendance policy and HEP activities.  PELVIC MMT:   MMT eval  Vaginal 3/5; 5s; 4 reps  Internal Anal Sphincter   External Anal Sphincter   Puborectalis   Diastasis Recti   (Blank rows = not tested)        TONE: WFL  PROLAPSE: Mild possible grade 1 laxity noted with second cough in hooklying   TODAY'S TREATMENT:                                                                                                                              DATE:   07/22/23 Manual: Rt anterior/Rt posterior pelvic mobilization in prone  Side lying Lt posterior innominate rotations  Myofascial release surrounding Rt SIJ and thoracolumbar fascia Neuromuscular re-education: Muscle energy techniques for Rt posterior and Lt anterior pelvic rotation Shot gun technique for stability of pelvic correction Bird dog 10x Dead bug 10x Exercises: Lower trunk rotation 2 x 10 Supine piriformis stretch 60 seconds bil   06/24/23 Neuromuscular re-education: Bird dog 5x Bird dog rows 10x bil 3 lbs  Modified dead bug with 2 lb weight 90/90 heel drops 10x - stopped due to using pressure increases to perform correctly Standing march 3 lb anterior weight hold with 5 second hold 10x Standing single leg hip hinge 10x bil with UE support Rows on single leg green band 10x bil Single UE extensions standing on single leg green band 10x bil Warrior 3 to half moon pelvic rotations 10x bil   06/03/23 Neuromuscular re-education: Bird-dog wiyth multimodal cues for improved form 2 x 10 Bear  plank 10x with multimodal cues for improved core activation  Reverse table top toe taps 10x Reverse table top fire hydrants 10x Supine weight drop 6 lbs 2 x 10 Supine head lifts 10x Seated march with anterior weight hold 6 lbs 2 x 10 Exercises: Lower trunk rotation 2 x 10 Therapeutic activities: Pallof press 10x bil green band Shoulder extensions green band 2 x 10   PATIENT EDUCATION:  Education details: [id number] Person educated: Patient Education method: Explanation, Demonstration, Tactile cues, Verbal cues, and Handouts Education comprehension: verbalized understanding, returned demonstration, verbal cues required, tactile cues required, and needs further education  HOME EXERCISE PROGRAM: [id number]  ASSESSMENT:  CLINICAL IMPRESSION: Pt doing very well with no vaginal pressure and little pain. She did wake up this morning with increased low back pain. We found her to have notable Rt posterior/Lt anterior innominate rotation. With mobilizations and manual techniques to help correct, she had increased mobility throughout pelvis. Shotgun technique performed to help stabilize. We checked several exercises for form and she is doing very well. She will continue to benefit from skilled PT intervention in order to address deficits.    OBJECTIVE IMPAIRMENTS: decreased activity tolerance, decreased coordination, decreased endurance, decreased strength, increased fascial restrictions, increased muscle spasms, impaired flexibility, improper  body mechanics, and postural dysfunction.   ACTIVITY LIMITATIONS: lifting and locomotion level  PARTICIPATION LIMITATIONS: community activity  PERSONAL FACTORS: 1 comorbidity: medical history  are also affecting patient's functional outcome.   REHAB POTENTIAL: Good  CLINICAL DECISION MAKING: Stable/uncomplicated  EVALUATION COMPLEXITY: Low   GOALS: Goals reviewed with patient? Yes  SHORT TERM GOALS: Updated 07/22/23  Pt to be I with HEP.   Baseline: Goal status: MET 06/03/23  2.  Pt to be I with lifting mechanics for 10# and pressure management to decreased strain at pelvic floor .  Baseline:  Goal status: MET 07/22/23  3.  Pt to be I with pressure management positions for decreased strain at prolapse.   Baseline:  Goal status:  MET 07/22/23   LONG TERM GOALS: Updated 07/22/23  Pt to be I with advanced HEP.  Baseline:  Goal status:  IN PROGRESS 07/22/23  2.  Pt to demonstrate improved coordination of pelvic floor and breathing mechanics with 20# squat with appropriate synergistic patterns to decrease pain and leakage at least 75% of the time.    Baseline:  Goal status:  IN PROGRESS 07/22/23  3.  Pt to demonstrate at least 5/5 bil hip strength for improved pelvic stability and functional squats without straining at prolapse.  Baseline:  Goal status:  IN PROGRESS 07/22/23  4.  Pt to report ability to complete 30 mins of workout without prolapse symptoms due to improved coordination and pelvic floor strengthening.  Baseline:  Goal status:  IN PROGRESS 07/22/23   PLAN:  PT FREQUENCY: 1-2x/week  PT DURATION: 10 sessions  PLANNED INTERVENTIONS: 97110-Therapeutic exercises, 97530- Therapeutic activity, 97112- Neuromuscular re-education, 97535- Self Care, [id number]- Manual therapy, Patient/Family education, Taping, Dry Needling, Joint mobilization, Spinal mobilization, Scar mobilization, DME instructions, Cryotherapy, Moist heat, and Biofeedback  PLAN FOR NEXT SESSION: coordination of pelvic floor and breathing, strengthening hips and core.   Josette Mares, PT, DPT07/21/253:30 PM

## 2023-08-26 ENCOUNTER — Ambulatory Visit (INDEPENDENT_AMBULATORY_CARE_PROVIDER_SITE_OTHER): Admitting: Clinical

## 2023-08-26 ENCOUNTER — Ambulatory Visit: Attending: Obstetrics and Gynecology

## 2023-08-26 DIAGNOSIS — M6281 Muscle weakness (generalized): Secondary | ICD-10-CM | POA: Insufficient documentation

## 2023-08-26 DIAGNOSIS — R279 Unspecified lack of coordination: Secondary | ICD-10-CM | POA: Diagnosis present

## 2023-08-26 DIAGNOSIS — R293 Abnormal posture: Secondary | ICD-10-CM | POA: Insufficient documentation

## 2023-08-26 DIAGNOSIS — F411 Generalized anxiety disorder: Secondary | ICD-10-CM | POA: Diagnosis not present

## 2023-08-26 NOTE — Progress Notes (Signed)
 Diagnosis: F41.1 Time: 10:00am-10:40 am CPT Code: 09165E-04  Dayla was seen remotely using secure video conferencing.She was in her home, and the therapist was in her office at the time of the appointment. Client is aware of risks of telehealth and consented to a virtual visit. Aurianna reported that she has continued to do well over the past two months, and has made significant strides overall in terms of managing anxiety and maintaining a healthy relationship with her husband. Session included reflecting upon her progress, and reviewing strategies that have worked for her. She also expressed concern about her near-constant urge for entertainment, and therapist suggested using mindfulness strategies to increase tolerance for moments of boredom and quiet. Chelisa will reach out when/if she needs to be seen again.  Treatment Plan Client Abilities/Strengths  Jennye presented as insightful and motivated, with clear goals for therapy.  Client Treatment Preferences  Aloura would like to begin with monthly appointments, and increase frequency once she has hit her insurance deductible.  Client Statement of Needs  Dakotah is seeking CBT to address anxiety and relationship challenges.  Treatment Level  Monthly  Symptoms  Anxiety : vivid, persistent thoughts of worst case scenarios that are difficult to disengage from (Status: maintained). Relational Challenges: Difficulty with mutual triggering of uncomfortable emotions in marriage (Status: maintained).  Problems Addressed  New Description, New Description  Goals 1. Difficulty in relationship with husband Objective Development of strategies to regulate emotional response to husband's stress Target Date: 2024-01-09 Frequency: Biweekly  Progress: 0 Modality: individual  Related Interventions Therapist will work with Doyal to develop communication strategies by helping her to consider how she might respond to different situations and offering opportunities to  practice, such as through written exercises and role plays Therapist will work with Doyal to develop her communication strategies, including labeling and expressing her emotions and using I statements 2. Persistent anxiety triggered by thoughts of worse case scenarios that feel real Objective Decrease in frequency and severity of anxiety, increase in overall well being Target Date: 2024-01-09 Frequency: Biweekly  Progress: 0 Modality: individual  Related Interventions Therapist will provide referrals for additional resources as appropriate Therapist will provide Lyle with strategies to regulate her emotions, including meditation, breathing exercises, mindfulness, and self-care. Therapist will help Chantel to identify and disengage from maladaptive thought patterns using CBT-based strategies Therapist will provide Erlene with opportunities to process her experiences in session Diagnosis Axis none 300.00 (Anxiety state, unspecified) - Open - [Signifier: n/a]    Conditions For Discharge Achievement of treatment goals and objectives      Andriette LITTIE Ponto, PhD               Andriette LITTIE Ponto, PhD

## 2023-08-26 NOTE — Therapy (Signed)
 OUTPATIENT PHYSICAL THERAPY FEMALE PELVIC TREATMENT   [name] MRN: [id number] DOB:Oct 20, 1985, 38 y.o., female Today's Date: 08/26/2023  END OF SESSION:  PT End of Session - 08/26/23 1531     Visit Number 6    Date for PT Re-Evaluation 10/08/23    Authorization Type CIgna    PT Start Time 1530    PT Stop Time 1610    PT Time Calculation (min) 40 min    Activity Tolerance Patient tolerated treatment well    Behavior During Therapy WFL for tasks assessed/performed            Past Medical History:  Diagnosis Date   ADD (attention deficit disorder)    Allergy    no meds   Anemia    as adolescent   Anxiety    Depression    GERD (gastroesophageal reflux disease)    doesn't take any meds for this, diet controlled   Headache(784.0)    Heart murmur    as a child - no problems   History of bronchitis    as a teenager, no problems as adult   History of migraine    can't remember when the last one was   Insomnia    no meds   Panic attacks    Symptomatic cholelithiasis 09/25/2011   UTI (urinary tract infection)    Past Surgical History:  Procedure Laterality Date   CHOLECYSTECTOMY  10/22/2011   Procedure: LAPAROSCOPIC CHOLECYSTECTOMY;  Surgeon: Vicenta DELENA Poli, MD;  Location: [facility];  Service: General;  Laterality: N/A;   DILATION AND EVACUATION N/A 12/07/2019   Procedure: DILATATION AND EVACUATION;  Surgeon: Alger Gong, MD;  Location: [facility];  Service: Gynecology;  Laterality: N/A;  NEEDS ULTRASOUND GUIDANCE ANORA KIT SENT    HYSTEROSCOPY WITH RESECTOSCOPE N/A 04/28/2013   Procedure:  HYSTEROSCOPY ;  Surgeon: Percilla Burly, MD;  Location: [facility];  Service: Gynecology;  Laterality: N/A;   LAPAROSCOPY N/A 04/28/2013   Procedure: LAPAROSCOPY DIAGNOSTIC;  Surgeon: Percilla Burly, MD;  Location: [facility];  Service: Gynecology;  Laterality: N/A;  2 hrs. total   OPERATIVE ULTRASOUND N/A 12/07/2019   Procedure: OPERATIVE  ULTRASOUND;  Surgeon: Alger Gong, MD;  Location: [facility];  Service: Gynecology;  Laterality: N/A;   WISDOM TOOTH EXTRACTION  20yrs ago   Patient Active Problem List   Diagnosis Date Noted   Hypokalemia 04/06/2021   Ehlers-Danlos syndrome 02/06/2021   Missed abortion    Female infertility 08/17/2019    PCP: none per chart  REFERRING PROVIDER: Constant, Peggy, MD   REFERRING DIAG: N81.11 (ICD-10-CM) - Midline cystocele  THERAPY DIAG:  Muscle weakness (generalized)  Abnormal posture  Unspecified lack of coordination  Rationale for Evaluation and Treatment: Rehabilitation  ONSET DATE: 1 week ago symptoms started  SUBJECTIVE:  SUBJECTIVE STATEMENT: Pt feels like she is doing well getting exercises in. She had some time when she felt like she was having difficulty with good core engagement. She states that she is having some upper back pain, but low back is bothering her minimally.   PAIN: 08/26/23 Are you having pain? Yes, low back 2/10 Aggravating: standing Easing: lying down   PRECAUTIONS: None  RED FLAGS: None   WEIGHT BEARING RESTRICTIONS: No  FALLS:  Has patient fallen in last 6 months? 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  ACTIVITY LEVEL : moderate (work and toddler, is trying to do a little more piliates or yoga)   PLOF: Independent  PATIENT GOALS: to have no pressure and stronger pelvic floor   PERTINENT HISTORY:  Ehlers-Danlos syndrome, depression, anxiety Sexual abuse: No  BOWEL MOVEMENT: Pain with bowel movement: No Type of bowel movement:Type (Bristol Stool Scale) 2-3, Frequency every couple of days, and Strain not often  Fully empty rectum: Yes:   Leakage: No Pads: No Fiber supplement/laxative No  URINATION: Pain with  urination: No Fully empty bladder: Yes:   Stream: Strong Urgency: No Frequency: not quicker than every 2 hours, not nightly  Leakage: none Pads: No  INTERCOURSE:  Ability to have vaginal penetration Yes  Pain with intercourse: none Dryness - sometimes Climax: not fully, usually externally  Marinoff Scale: 0/3 Does use lubricant   PREGNANCY: Vaginal deliveries 01 Tearing No Episiotomy No C-section deliveries 0 Currently pregnant No  PROLAPSE: Pressure and Bulge - not since Friday when started period    OBJECTIVE:  Note: Objective measures were completed at Evaluation unless otherwise noted.  DIAGNOSTIC FINDINGS:   COGNITION: Overall cognitive status: Within functional limits for tasks assessed     SENSATION: Light touch: Appears intact   FUNCTIONAL TESTS:  Functional squat -   GAIT: WFL  POSTURE: rounded shoulders, forward head, and anterior pelvic tilt   LUMBARAROM/PROM:  A/PROM A/PROM  eval  Flexion WFL  Extension WFL  Right lateral flexion WFL  Left lateral flexion WFL  Right rotation WFL  Left rotation WFL   (Blank rows = not tested)  LOWER EXTREMITY ROM:  WFL  LOWER EXTREMITY MMT:  Bil hips grossly 4/5; knees 5/5 PALPATION:    Pelvic Alignment: WFL  Abdominal: fascial restrictions throughout lower abdominal quadrants                External Perineal Exam: [facility]                             Internal Pelvic Floor: no TTP  Patient confirms identification and approves PT to assess internal pelvic floor and treatment Yes No emotional/communication barriers or cognitive limitation. Patient is motivated to learn. Patient understands and agrees with treatment goals and plan. PT explains patient will be examined in standing, sitting, and lying down to see how their muscles and joints work. When they are ready, they will be asked to remove their underwear so PT can examine their perineum. The patient is also given the option of providing their own  chaperone as one is not provided in our facility. The patient also has the right and is explained the right to defer or refuse any part of the evaluation or treatment including the internal exam. With the patient's consent, PT will use one gloved finger to gently assess the muscles of the pelvic floor, seeing how well it contracts and relaxes and if  there is muscle symmetry. After, the patient will get dressed and PT and patient will discuss exam findings and plan of care. PT and patient discuss plan of care, schedule, attendance policy and HEP activities.  PELVIC MMT:   MMT eval  Vaginal 3/5; 5s; 4 reps  Internal Anal Sphincter   External Anal Sphincter   Puborectalis   Diastasis Recti   (Blank rows = not tested)        TONE: WFL  PROLAPSE: Mild possible grade 1 laxity noted with second cough in hooklying   TODAY'S TREATMENT:                                                                                                                              DATE:   08/26/23 Neuromuscular re-education: Half kneeling/half side lunge stretch with alternating lunge stretch 10x bil Half kneeling leg lifts 10x bil Swiss ball wall plank + marches 20x   Hip abduction standing against swiss ball + transversus abdominus 10x bil Exercises: Seated thoracic extension over  foam roller 2 x 10 Seated P position with rotation and lateral bend 10 breaths bil  Thread the needle 1x bil 10 breaths each Ball rolls up wall 10x   07/22/23 Manual: Rt anterior/Rt posterior pelvic mobilization in prone  Side lying Lt posterior innominate rotations  Myofascial release surrounding Rt SIJ and thoracolumbar fascia Neuromuscular re-education: Muscle energy techniques for Rt posterior and Lt anterior pelvic rotation Shot gun technique for stability of pelvic correction Bird dog 10x Dead bug 10x Exercises: Lower trunk rotation 2 x 10 Supine piriformis stretch 60 seconds bil   06/24/23 Neuromuscular  re-education: Bird dog 5x Bird dog rows 10x bil 3 lbs  Modified dead bug with 2 lb weight 90/90 heel drops 10x - stopped due to using pressure increases to perform correctly Standing march 3 lb anterior weight hold with 5 second hold 10x Standing single leg hip hinge 10x bil with UE support Rows on single leg green band 10x bil Single UE extensions standing on single leg green band 10x bil Warrior 3 to half moon pelvic rotations 10x bil  PATIENT EDUCATION:  Education details: [id number] Person educated: Patient Education method: Programmer, multimedia, Demonstration, Tactile cues, Verbal cues, and Handouts Education comprehension: verbalized understanding, returned demonstration, verbal cues required, tactile cues required, and needs further education  HOME EXERCISE PROGRAM: [id number]  ASSESSMENT:  CLINICAL IMPRESSION: Pt doing well with low back pain and exercises, but will get aggravation of pain every few weeks. We progressed mobility in thoracic and low back to help improve core and pelvic floor engagement. She did well with all activities. When doing half kneeling leg lifts she had no difficulty when lifting Rt LE and significant difficulty when lifting Lt LE. She will continue to benefit from skilled PT intervention in order to address deficits.    OBJECTIVE IMPAIRMENTS: decreased activity tolerance, decreased coordination, decreased endurance, decreased strength, increased fascial restrictions, increased muscle spasms, impaired flexibility, improper  body mechanics, and postural dysfunction.   ACTIVITY LIMITATIONS: lifting and locomotion level  PARTICIPATION LIMITATIONS: community activity  PERSONAL FACTORS: 1 comorbidity: medical history  are also affecting patient's functional outcome.   REHAB POTENTIAL: Good  CLINICAL DECISION MAKING: Stable/uncomplicated  EVALUATION COMPLEXITY: Low   GOALS: Goals reviewed with patient? Yes  SHORT TERM GOALS: Updated 07/22/23  Pt to be I with  HEP.  Baseline: Goal status: MET 06/03/23  2.  Pt to be I with lifting mechanics for 10# and pressure management to decreased strain at pelvic floor .  Baseline:  Goal status: MET 07/22/23  3.  Pt to be I with pressure management positions for decreased strain at prolapse.   Baseline:  Goal status:  MET 07/22/23   LONG TERM GOALS: Updated 08/26/23  Pt to be I with advanced HEP.  Baseline:  Goal status:  IN PROGRESS 08/26/23  2.  Pt to demonstrate improved coordination of pelvic floor and breathing mechanics with 20# squat with appropriate synergistic patterns to decrease pain and leakage at least 75% of the time.    Baseline:  Goal status:  IN PROGRESS 08/26/23  3.  Pt to demonstrate at least 5/5 bil hip strength for improved pelvic stability and functional squats without straining at prolapse.  Baseline:  Goal status:  IN PROGRESS 08/26/23  4.  Pt to report ability to complete 30 mins of workout without prolapse symptoms due to improved coordination and pelvic floor strengthening.  Baseline:  Goal status:  IN PROGRESS 08/26/23   PLAN:  PT FREQUENCY: 1-2x/week  PT DURATION: 10 sessions  PLANNED INTERVENTIONS: 97110-Therapeutic exercises, 97530- Therapeutic activity, 97112- Neuromuscular re-education, 97535- Self Care, [id number]- Manual therapy, Patient/Family education, Taping, Dry Needling, Joint mobilization, Spinal mobilization, Scar mobilization, DME instructions, Cryotherapy, Moist heat, and Biofeedback  PLAN FOR NEXT SESSION: coordination of pelvic floor and breathing, strengthening hips and core.   Josette Mares, PT, DPT08/25/254:04 PM

## 2023-09-23 ENCOUNTER — Ambulatory Visit: Attending: Obstetrics and Gynecology

## 2023-09-23 DIAGNOSIS — R293 Abnormal posture: Secondary | ICD-10-CM | POA: Insufficient documentation

## 2023-09-23 DIAGNOSIS — M6281 Muscle weakness (generalized): Secondary | ICD-10-CM | POA: Diagnosis present

## 2023-09-23 DIAGNOSIS — R279 Unspecified lack of coordination: Secondary | ICD-10-CM | POA: Insufficient documentation

## 2023-09-23 NOTE — Therapy (Signed)
 OUTPATIENT PHYSICAL THERAPY FEMALE PELVIC TREATMENT   [name] MRN: [id number] DOB:March 17, 1985, 38 y.o., female Today's Date: 09/23/2023  END OF SESSION:  PT End of Session - 09/23/23 1616     Visit Number 7    Date for Recertification  10/08/23    Authorization Type CIgna    PT Start Time 1616    PT Stop Time 1657    PT Time Calculation (min) 41 min    Activity Tolerance Patient tolerated treatment well    Behavior During Therapy WFL for tasks assessed/performed             Past Medical History:  Diagnosis Date   ADD (attention deficit disorder)    Allergy    no meds   Anemia    as adolescent   Anxiety    Depression    GERD (gastroesophageal reflux disease)    doesn't take any meds for this, diet controlled   Headache(784.0)    Heart murmur    as a child - no problems   History of bronchitis    as a teenager, no problems as adult   History of migraine    can't remember when the last one was   Insomnia    no meds   Panic attacks    Symptomatic cholelithiasis 09/25/2011   UTI (urinary tract infection)    Past Surgical History:  Procedure Laterality Date   CHOLECYSTECTOMY  10/22/2011   Procedure: LAPAROSCOPIC CHOLECYSTECTOMY;  Surgeon: Vicenta DELENA Poli, MD;  Location: [facility];  Service: General;  Laterality: N/A;   DILATION AND EVACUATION N/A 12/07/2019   Procedure: DILATATION AND EVACUATION;  Surgeon: Alger Gong, MD;  Location: [facility];  Service: Gynecology;  Laterality: N/A;  NEEDS ULTRASOUND GUIDANCE ANORA KIT SENT    HYSTEROSCOPY WITH RESECTOSCOPE N/A 04/28/2013   Procedure:  HYSTEROSCOPY ;  Surgeon: Percilla Burly, MD;  Location: [facility];  Service: Gynecology;  Laterality: N/A;   LAPAROSCOPY N/A 04/28/2013   Procedure: LAPAROSCOPY DIAGNOSTIC;  Surgeon: Percilla Burly, MD;  Location: [facility];  Service: Gynecology;  Laterality: N/A;  2 hrs. total   OPERATIVE ULTRASOUND N/A 12/07/2019   Procedure: OPERATIVE  ULTRASOUND;  Surgeon: Alger Gong, MD;  Location: [facility];  Service: Gynecology;  Laterality: N/A;   WISDOM TOOTH EXTRACTION  42yrs ago   Patient Active Problem List   Diagnosis Date Noted   Hypokalemia 04/06/2021   Ehlers-Danlos syndrome 02/06/2021   Missed abortion    Female infertility 08/17/2019    PCP: none per chart  REFERRING PROVIDER: Constant, Peggy, MD   REFERRING DIAG: N81.11 (ICD-10-CM) - Midline cystocele  THERAPY DIAG:  Muscle weakness (generalized)  Abnormal posture  Unspecified lack of coordination  Rationale for Evaluation and Treatment: Rehabilitation  ONSET DATE: 1 week ago symptoms started  SUBJECTIVE:  SUBJECTIVE STATEMENT: Pt states that due to being sick she was not performing regular exercise and feels like she lost a little bit of endurance. She has been working on regular exercise overall and feels like she is maintaining progress and pain management in a good place even though she is not progressing strength at this point.   She has not felt any symptoms of bladder prolapse in a long time. She feels like she does not have the strength in her back to hold good upright posture and is working on this.   PAIN: 09/23/23 Are you having pain? Yes, low back 0/10 Aggravating: standing Easing: lying down   PRECAUTIONS: None  RED FLAGS: None   WEIGHT BEARING RESTRICTIONS: No  FALLS:  Has patient fallen in last 6 months? 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  ACTIVITY LEVEL : moderate (work and toddler, is trying to do a little more piliates or yoga)   PLOF: Independent  PATIENT GOALS: to have no pressure and stronger pelvic floor   PERTINENT HISTORY:  Ehlers-Danlos syndrome, depression, anxiety Sexual abuse: No  BOWEL  MOVEMENT: Pain with bowel movement: No Type of bowel movement:Type (Bristol Stool Scale) 2-3, Frequency every couple of days, and Strain not often  Fully empty rectum: Yes:   Leakage: No Pads: No Fiber supplement/laxative No  URINATION: Pain with urination: No Fully empty bladder: Yes:   Stream: Strong Urgency: No Frequency: not quicker than every 2 hours, not nightly  Leakage: none Pads: No  INTERCOURSE:  Ability to have vaginal penetration Yes  Pain with intercourse: none Dryness - sometimes Climax: not fully, usually externally  Marinoff Scale: 0/3 Does use lubricant   PREGNANCY: Vaginal deliveries 01 Tearing No Episiotomy No C-section deliveries 0 Currently pregnant No  PROLAPSE: Pressure and Bulge - not since Friday when started period    OBJECTIVE:  Note: Objective measures were completed at Evaluation unless otherwise noted.  DIAGNOSTIC FINDINGS:   COGNITION: Overall cognitive status: Within functional limits for tasks assessed     SENSATION: Light touch: Appears intact   FUNCTIONAL TESTS:  Functional squat -   GAIT: WFL  POSTURE: rounded shoulders, forward head, and anterior pelvic tilt   LUMBARAROM/PROM:  A/PROM A/PROM  eval  Flexion WFL  Extension WFL  Right lateral flexion WFL  Left lateral flexion WFL  Right rotation WFL  Left rotation WFL   (Blank rows = not tested)  LOWER EXTREMITY ROM:  WFL  LOWER EXTREMITY MMT:  Bil hips grossly 4/5; knees 5/5 PALPATION:    Pelvic Alignment: WFL  Abdominal: fascial restrictions throughout lower abdominal quadrants                External Perineal Exam: [facility]                             Internal Pelvic Floor: no TTP  Patient confirms identification and approves PT to assess internal pelvic floor and treatment Yes No emotional/communication barriers or cognitive limitation. Patient is motivated to learn. Patient understands and agrees with treatment goals and plan. PT explains patient  will be examined in standing, sitting, and lying down to see how their muscles and joints work. When they are ready, they will be asked to remove their underwear so PT can examine their perineum. The patient is also given the option of providing their own chaperone as one is not provided in our facility. The patient also has the  right and is explained the right to defer or refuse any part of the evaluation or treatment including the internal exam. With the patient's consent, PT will use one gloved finger to gently assess the muscles of the pelvic floor, seeing how well it contracts and relaxes and if there is muscle symmetry. After, the patient will get dressed and PT and patient will discuss exam findings and plan of care. PT and patient discuss plan of care, schedule, attendance policy and HEP activities.  PELVIC MMT:   MMT eval  Vaginal 3/5; 5s; 4 reps  Internal Anal Sphincter   External Anal Sphincter   Puborectalis   Diastasis Recti   (Blank rows = not tested)        TONE: WFL  PROLAPSE: Mild possible grade 1 laxity noted with second cough in hooklying   TODAY'S TREATMENT:                                                                                                                              DATE:   09/23/23 Neuromuscular re-education: Mountain climbers on counter 2 x 10 (slow) Reverse table top heel taps 10x bil Bear plank 5x Bird dog 10x bil Bridge with hip adduction, transversus abdominus, and pelvic floor muscle 2 x 10 Therapeutic activities: Bent rows + 8lbs unilaterally 2 x 10 Standing shoulder extension + green band 2 x 10 HEP review - frequency and duration   08/26/23 Neuromuscular re-education: Half kneeling/half side lunge stretch with alternating lunge stretch 10x bil Half kneeling leg lifts 10x bil Swiss ball wall plank + marches 20x   Hip abduction standing against swiss ball + transversus abdominus 10x bil Exercises: Seated thoracic extension over  foam  roller 2 x 10 Seated P position with rotation and lateral bend 10 breaths bil  Thread the needle 1x bil 10 breaths each Ball rolls up wall 10x   07/22/23 Manual: Rt anterior/Rt posterior pelvic mobilization in prone  Side lying Lt posterior innominate rotations  Myofascial release surrounding Rt SIJ and thoracolumbar fascia Neuromuscular re-education: Muscle energy techniques for Rt posterior and Lt anterior pelvic rotation Shot gun technique for stability of pelvic correction Bird dog 10x Dead bug 10x Exercises: Lower trunk rotation 2 x 10 Supine piriformis stretch 60 seconds bil   PATIENT EDUCATION:  Education details: [id number] Person educated: Patient Education method: Explanation, Demonstration, Tactile cues, Verbal cues, and Handouts Education comprehension: verbalized understanding, returned demonstration, verbal cues required, tactile cues required, and needs further education  HOME EXERCISE PROGRAM: [id number]  ASSESSMENT:  CLINICAL IMPRESSION: Pt doing very well with management of symptoms and has not had any prolapse symptoms in many weeks. She still feels some upper back pain, but is working on returning to exercises to work on strengthening in this area. We incorporated some exercises to help full spinal strengthening with appropriate core activation today and added to HEP. Due to progress, having met goals, and being independent with HEP, she is  prepared to D/C at this time. She was encouraged to call with any questions or concerns.    OBJECTIVE IMPAIRMENTS: decreased activity tolerance, decreased coordination, decreased endurance, decreased strength, increased fascial restrictions, increased muscle spasms, impaired flexibility, improper body mechanics, and postural dysfunction.   ACTIVITY LIMITATIONS: lifting and locomotion level  PARTICIPATION LIMITATIONS: community activity  PERSONAL FACTORS: 1 comorbidity: medical history  are also affecting patient's functional  outcome.   REHAB POTENTIAL: Good  CLINICAL DECISION MAKING: Stable/uncomplicated  EVALUATION COMPLEXITY: Low   GOALS: Goals reviewed with patient? Yes  SHORT TERM GOALS: Updated 07/22/23  Pt to be I with HEP.  Baseline: Goal status: MET 06/03/23  2.  Pt to be I with lifting mechanics for 10# and pressure management to decreased strain at pelvic floor .  Baseline:  Goal status: MET 07/22/23  3.  Pt to be I with pressure management positions for decreased strain at prolapse.   Baseline:  Goal status:  MET 07/22/23   LONG TERM GOALS: Updated 09/23/23  Pt to be I with advanced HEP.  Baseline:  Goal status:  MET 09/23/23  2.  Pt to demonstrate improved coordination of pelvic floor and breathing mechanics with 20# squat with appropriate synergistic patterns to decrease pain and leakage at least 75% of the time.    Baseline:  Goal status:  MET 09/23/23  3.  Pt to demonstrate at least 5/5 bil hip strength for improved pelvic stability and functional squats without straining at prolapse.  Baseline:  Goal status: MET 09/23/23  4.  Pt to report ability to complete 30 mins of workout without prolapse symptoms due to improved coordination and pelvic floor strengthening.  Baseline:  Goal status: MET 09/23/23   PLAN:  PT FREQUENCY: -  PT DURATION: -  PLANNED INTERVENTIONS: -  PLAN FOR NEXT SESSION: d/c  PHYSICAL THERAPY DISCHARGE SUMMARY  Visits from Start of Care: 7  Current functional level related to goals / functional outcomes: Independent   Remaining deficits: See above   Education / Equipment: HEP   Patient agrees to discharge. Patient goals were met. Patient is being discharged due to meeting the stated rehab goals.   Josette Mares, PT, DPT09/22/254:17 PM
